# Patient Record
Sex: Male | Born: 1945
Health system: Southern US, Community
[De-identification: ages and names within clinical notes are randomized; demographics above are authoritative.]

## PROBLEM LIST (undated history)

## (undated) DIAGNOSIS — F1020 Alcohol dependence, uncomplicated: Secondary | ICD-10-CM

## (undated) DIAGNOSIS — D649 Anemia, unspecified: Secondary | ICD-10-CM

## (undated) DIAGNOSIS — M1990 Unspecified osteoarthritis, unspecified site: Secondary | ICD-10-CM

## (undated) DIAGNOSIS — K709 Alcoholic liver disease, unspecified: Principal | ICD-10-CM

## (undated) DIAGNOSIS — I2510 Atherosclerotic heart disease of native coronary artery without angina pectoris: Secondary | ICD-10-CM

## (undated) DIAGNOSIS — K219 Gastro-esophageal reflux disease without esophagitis: Secondary | ICD-10-CM

## (undated) DIAGNOSIS — R001 Bradycardia, unspecified: Secondary | ICD-10-CM

## (undated) DIAGNOSIS — N529 Male erectile dysfunction, unspecified: Secondary | ICD-10-CM

## (undated) DIAGNOSIS — E785 Hyperlipidemia, unspecified: Secondary | ICD-10-CM

## (undated) DIAGNOSIS — I509 Heart failure, unspecified: Secondary | ICD-10-CM

## (undated) DIAGNOSIS — I219 Acute myocardial infarction, unspecified: Secondary | ICD-10-CM

## (undated) DIAGNOSIS — I739 Peripheral vascular disease, unspecified: Secondary | ICD-10-CM

## (undated) DIAGNOSIS — C4490 Unspecified malignant neoplasm of skin, unspecified: Secondary | ICD-10-CM

## (undated) DIAGNOSIS — R0781 Pleurodynia: Principal | ICD-10-CM

## (undated) DIAGNOSIS — I10 Essential (primary) hypertension: Secondary | ICD-10-CM

## (undated) DIAGNOSIS — R011 Cardiac murmur, unspecified: Secondary | ICD-10-CM

## (undated) HISTORY — PX: HERNIA REPAIR: SHX51

## (undated) HISTORY — DX: Hyperlipidemia, unspecified: E78.5

## (undated) HISTORY — DX: Unspecified malignant neoplasm of skin, unspecified: C44.90

## (undated) HISTORY — DX: Other disorders of iron metabolism: E83.19

## (undated) HISTORY — DX: Alcohol dependence, uncomplicated: F10.20

## (undated) HISTORY — PX: SUPRAVALVULAR AORTIC STENOSIS REPAIR: SHX2475

## (undated) HISTORY — PX: EYE SURGERY: SHX253

## (undated) HISTORY — DX: Heart failure, unspecified: I50.9

## (undated) HISTORY — DX: Atherosclerotic heart disease of native coronary artery without angina pectoris: I25.10

## (undated) HISTORY — DX: Alcoholic liver disease, unspecified: K70.9

## (undated) HISTORY — PX: MOHS SURGERY: SHX181

## (undated) HISTORY — DX: Male erectile dysfunction, unspecified: N52.9

## (undated) HISTORY — DX: Bradycardia, unspecified: R00.1

## (undated) HISTORY — DX: Cardiac murmur, unspecified: R01.1

## (undated) HISTORY — DX: Essential (primary) hypertension: I10

## (undated) HISTORY — DX: Pleurodynia: R07.81

## (undated) HISTORY — PX: FRACTURE SURGERY: SHX138

## (undated) HISTORY — DX: Peripheral vascular disease, unspecified: I73.9

## (undated) HISTORY — PX: NASAL SEPTUM SURGERY: SHX37

## (undated) HISTORY — DX: Acute myocardial infarction, unspecified: I21.9

## (undated) HISTORY — DX: Anemia, unspecified: D64.9

## (undated) HISTORY — PX: KNEE ARTHROSCOPY: SUR90

---

## 2000-08-26 ENCOUNTER — Ambulatory Visit (HOSPITAL_COMMUNITY): Admission: RE | Admit: 2000-08-26 | Discharge: 2000-08-26 | Payer: Self-pay | Admitting: Gastroenterology

## 2008-01-26 ENCOUNTER — Inpatient Hospital Stay (HOSPITAL_COMMUNITY): Admission: EM | Admit: 2008-01-26 | Discharge: 2008-02-01 | Payer: Self-pay | Admitting: Emergency Medicine

## 2008-01-27 HISTORY — PX: CORONARY ANGIOPLASTY WITH STENT PLACEMENT: SHX49

## 2008-01-31 HISTORY — PX: CORONARY ANGIOPLASTY WITH STENT PLACEMENT: SHX49

## 2008-03-01 ENCOUNTER — Encounter (HOSPITAL_COMMUNITY): Admission: RE | Admit: 2008-03-01 | Discharge: 2008-03-31 | Payer: Self-pay | Admitting: Cardiology

## 2008-04-24 ENCOUNTER — Encounter: Admission: RE | Admit: 2008-04-24 | Discharge: 2008-04-24 | Payer: Self-pay | Admitting: Family Medicine

## 2009-03-05 ENCOUNTER — Ambulatory Visit (HOSPITAL_COMMUNITY): Admission: RE | Admit: 2009-03-05 | Discharge: 2009-03-05 | Payer: Self-pay | Admitting: Urology

## 2009-03-05 ENCOUNTER — Encounter (INDEPENDENT_AMBULATORY_CARE_PROVIDER_SITE_OTHER): Payer: Self-pay | Admitting: Urology

## 2010-11-07 ENCOUNTER — Encounter: Payer: Self-pay | Admitting: Cardiovascular Disease

## 2010-12-07 LAB — CBC
HCT: 34.7 % — ABNORMAL LOW (ref 39.0–52.0)
Hemoglobin: 11.9 g/dL — ABNORMAL LOW (ref 13.0–17.0)
MCHC: 34.2 g/dL (ref 30.0–36.0)
MCV: 99.9 fL (ref 78.0–100.0)
RBC: 3.47 MIL/uL — ABNORMAL LOW (ref 4.22–5.81)
WBC: 4.3 10*3/uL (ref 4.0–10.5)

## 2010-12-07 LAB — BASIC METABOLIC PANEL
CO2: 25 mEq/L (ref 19–32)
Chloride: 93 mEq/L — ABNORMAL LOW (ref 96–112)
GFR calc Af Amer: >60 mL/min (ref >60)
Potassium: 4.5 mEq/L (ref 3.5–5.1)
Sodium: 128 mEq/L — ABNORMAL LOW (ref 135–145)

## 2011-01-13 NOTE — Cardiovascular Report (Signed)
NAME:  FLEETWOOD, PIERRON NO.:  [id number]   MEDICAL RECORD NO.:  [id number]          PATIENT TYPE:  INP   LOCATION:  6533                         FACILITY:  [facility]   PHYSICIAN:  Nicki Guadalajara, M.D.     DATE OF BIRTH:  [birth year]   DATE OF PROCEDURE:  01/31/2008  DATE OF DISCHARGE:                            CARDIAC CATHETERIZATION   INDICATIONS:  [name] is a 65 year old gentleman who was  admitted to [facility] on Jan 26, 2008, with chest pain  syndrome worrisome for unstable angina.  Cardiac catheterization was  performed on February 29, 2009.  This revealed a 95%-99% stenosis in the  LAD between the first and second diagonal vessel in addition to 80%-90%  ostial stenosis in the circumflex marginal vessel and 50% RCA lesion.  At the time, the patient underwent successful stenting of his LAD with  ultimate insertion of a 3.0 x 13-mm drug-eluting Cypher stent.  Subsequently, he has done well.  He is now brought back to the  laboratory for staged intervention to the ostium of his circumflex  marginal vessel.   PROCEDURE:  After premedication with Valium 5 mg intravenously, the  patient was prepped and draped in usual fashion.  His right femoral  artery was punctured anteriorly and a 6-French sheath was inserted.  The  patient has been on Plavix since his initial intervention and had  received his dose of Plavix this morning.  Anticoagulation was done  utilizing Angiomax.  ACT was documented to be therapeutic.  A 6-French  of [place] 3.5 guide was used for the interventional procedure.  Due to the  ostial nature and angle of the circumflex marginal arising from the AV  groove circumflex, the decision was made to use cutting balloon  arthrotomy.  Initially, a 2.25 x 6-mm cutting balloon was inserted and  sequential dilatations were made up to 5 atmospheres.  The cutting  balloon was then upgraded to a 2.5 x 10-mm cutting balloon.  Sequential  dilatations  were made up to 7 atmospheres.  Scout angiography confirmed  an excellent angiographic result.  Consequently, with the angle takeoff  of the marginal combined with the excellent result, the decision was  made to not stent due to due to the ostial nature and the potential for  the stent needing to be partially in the AV groove circumflex.  The  scout angiography confirmed an excellent angiographic result.  The  patient tolerated the procedure well.  During the procedure, he received  numerous doses of intracoronary nitroglycerin.   HEMODYNAMIC DATA:  Central aortic pressure was 127/67.   ANGIOGRAPHIC DATA:  A re-look of the LAD system revealed a widely patent  stent in the LAD position between the first and second diagonal vessel.   The left circumflex vessel gave rise to a small first marginal vessel  and then had moderate-sized second marginal vessel.  The second marginal  vessel had 90% ostial narrowing followed by 80%-90% proximal stenosis.  Following successful cutting balloon arthrotomy with a 2.25 and ultimate  2.5 x 10-mm cutting balloon, the stenosis was reduced  to 0%.  There was  brisk TIMI III flow.  There was no evidence for dissection.  The patient  tolerated the procedure well and left the laboratory with stable  hemodynamics.   IMPRESSION:  1. Successful percutaneous coronary intervention with cutting balloon      arthrotomy of the ostium of the circumflex      obtuse marginal 2 vessel with 80%-90% stenosis being reduced to 0%      done with bivalirudin in this patient on oral Plavix in addition to      intracoronary nitroglycerin.  2. Widely patent stent in the left anterior descending artery at the      site of prior intervention from Jan 27, 2008.           ______________________________  Nicki Guadalajara, M.D.     TK/MEDQ  D:  01/31/2008  T:  01/31/2008  Job:  [id number]   cc:   Vikki Ports, M.D.

## 2011-01-13 NOTE — Op Note (Signed)
NAME:  Corey Garcia, Corey Garcia               ACCOUNT NO.:  [id number]   MEDICAL RECORD NO.:  [id number]          PATIENT TYPE:  AMB   LOCATION:  DAY                          FACILITY:  [facility]   PHYSICIAN:  Maretta Bees. Vonita Moss, M.D.DATE OF BIRTH:  [birth year]   DATE OF PROCEDURE:  03/05/2009  DATE OF DISCHARGE:                               OPERATIVE REPORT   PREOPERATIVE DIAGNOSIS:  Microhematuria and positive FSH test.   POSTOPERATIVE DIAGNOSIS:  Microhematuria and positive FSH test.   PROCEDURE:  Cystoscopy, bilateral retrograde pyelograms with  interpretation, and cold cup bladder biopsy.   SURGEON:  Dr. Larey Dresser.   ANESTHESIA:  General.   INDICATIONS:  This 65 year old gentleman was worked up for  microhematuria.  A renal ultrasound was unremarkable.  Office cystoscopy  was negative.  He did have a positive FSH test and he needed cysto and  retrograde pyelograms, but that needed to be deferred because of Plavix  therapy for a coronary stent and now comes in for workup.   PROCEDURE:  The patient was brought to the operating room and placed in  lithotomy position.  External genitalia were prepped and draped in the  usual fashion.  He was cystoscoped.  The anterior urethra was normal and  the prostatic urethra was unremarkable.  The bladder had no stones,  tumors or inflammatory lesions.   Using the cystoscope and a cone-tipped ureteral catheter, bilateral  retrograde pyelograms were obtained.  Between live fluoroscopic views  and plain film images, there was no evidence of obstruction or filling  defects in the upper urinary tract.   The bladder was then biopsied in the base and in random fashion using  the cold cup bladder biopsy forceps.  Three biopsies were taken and the  biopsy sites were fulgurated with the Bugbee electrode.  He was then  taken to the recovery room in good condition.  Blood loss was 0.  He  tolerated the procedure well.      Maretta Bees. Vonita Moss, M.D.  Electronically Signed     LJP/MEDQ  D:  03/05/2009  T:  03/05/2009  Job:  [id number]   cc:   Madaline Savage, M.D.  Fax: [phone]   Llana Aliment. Deterding, M.D.  Fax: [phone]   Vikki Ports, M.D.  Fax: [phone]

## 2011-01-13 NOTE — Cardiovascular Report (Signed)
NAME:  Corey Garcia NO.:  [id number]   MEDICAL RECORD NO.:  [id number]           PATIENT TYPE:   LOCATION:                                 FACILITY:   PHYSICIAN:  Nicki Guadalajara, M.D.     DATE OF BIRTH:  1945/10/06   DATE OF PROCEDURE:  DATE OF DISCHARGE:                            CARDIAC CATHETERIZATION   INDICATIONS:  [name] is a 65 year old gentleman who has a  history of hypertension, hyperlipidemia, family history of coronary  disease who developed new onset chest pain, two nights ago approximately  at 3:00 a.m.  His pain ultimately subsided.  Yesterday, he developed  recurrent episodes of chest pain.  Consequently, he presented to [facility].  Symptom complex was suggestive of unstable angina.  He  is now referred for diagnostic cardiac catheterization and possible  intervention.   PROCEDURE:  After premedication with Valium 5 mg intravenously, the  patient was prepped and draped in the usual fashion.  His right femoral  artery was punctured anteriorly and a 5-French sheath was inserted.  Diagnostic catheterization was done utilizing 5-French Judkins for left  and right coronary catheters.  A 5-French pigtail catheter was used for  biplane cine left ventriculography as well as distal aortography.   With a demonstration of high-grade 95-99% stenosis in the LAD as well as  concomitant disease with 80-90% stenosis in the circumflex marginal, 50%  stenosis in the right coronary artery, decision was made to attempt  intervention to at least his LAD today.  The patient was having chest  pain prior to the intervention.  His 5-French sheath was upgraded to a 6-  [place] system.  Double-bolus Integrilin and 600 mg oral Plavix were  administered.  He received weight adjusted heparin therapy.  ACT was  documented to be therapeutic.  He was started on intravenous  nitroglycerin and his nitroglycerin paste was removed.  Several doses of  intracoronary nitroglycerin also were administered throughout the  procedure.  Ultimately, his arterial sheath was upgraded to 6-French  system.  Ultimately, 6-French XB left 3.5 guide was used for the  procedure.  A Prowater wire was advanced down the LAD.  Predilatation  was done with a 2.5 x 12 mm Voyager balloon with positioning of the  catheter between the first and second diagonal vessel.  A 3.0 x 13-mm  Cypher stent then successfully inserted and deployed x2 upto 12-13  atmospheres.  Careful attention was made not to gel either diagonal  branch.  Poststent dilatation was done utilizing a 3.0 x 12-mm Quantum  balloon.  During the procedure, the patient did experience chest pain.  He did receive morphine as well as increasing doses of intracoronary  nitroglycerin.  His chest pain subsided.  However, he still noticed a  vague dull ache.  At the end of the procedure, he was uncomfortable  resting on the table, and it was felt that at this point a staged  approach should be made to open up the circumflex marginal vessel due to  his contrast load of already approximately 315 mL  of contrast.  He  tolerated the procedure well.  He returned to room in satisfactory  condition.   Hemodynamic Data:  Central aortic pressure was 137/69 and left  ventricular pressure 137/10.  During the procedure, when he was having  chest pain, his blood pressure did increase to approximately 160/100.   Angiographic Data:  Left main coronary artery was angiographically  normal and bifurcated into LAD and left circumflex system.   The LAD gave rise to a proximal diagonal vessel.  There was 95-99%  stenosis in the LAD between the first and second diagonal vessel.  There  is mild 20% narrowing after the second diagonal takeoff.  The remainder  of the LAD was free of significant disease.   The circumflex vessel was moderate size vessel.  The first marginal  vessel was a small caliber.  The second marginal vessel  was moderate  size and extended to the apex.  There was ostial narrowing of 80-90% in  the larger circumflex marginal vessel.   The right coronary artery was moderate sized vessel that had 50% diffuse  narrowing in the proximal to mid segment after an initial marginal  branch.   Biplane cine left ventriculography revealed normal LV contractility  without focal segmental wall motion abnormalities.   Distal aortography revealed normal renal arteries.  There was no  significant aortoiliac disease.   Following intervention to the LAD system with PTCA and ultimate  stenting, the 95-99% LAD stenosis was reduced to 0% with ultimate  insertion of a 3.0 x 13-mm drug-eluting Cypher stent with careful  positioning of the stent in between both the first and second diagonal  vessel without vessel encroachment and with poststent dilatation up to  approximately 3.1 mm.   IMPRESSION:  1. Normal left ventricular function.  2. Three-vessel coronary artery disease with 95-99% diffuse stenosis      in the left anterior descending between the first and second      diagonal vessel; 80-90% ostial stenosis in the circumflex marginal      two vessel and 50% diffuse narrowing in the proximal-to-mid-right      coronary artery.  3. Normal aortoiliac system.  4. Successful percutaneous transluminal coronary angioplasty/stenting      of the left anterior descending with ultimate insertion of a 3.0 x      13-mm Cypher drug-eluting stent postdilated 3.1 mm, double-bolus      Integralin/600 mg oral Plavix/weight-adjusted heparinization as      well as intravenous and intracoronary nitroglycerin.           ______________________________  Nicki Guadalajara, M.D.     TK/MEDQ  D:  01/27/2008  T:  01/28/2008  Job:  [id number]   cc:   Vikki Ports, M.D.

## 2011-01-13 NOTE — Discharge Summary (Signed)
Corey Garcia, Corey Garcia               ACCOUNT NO.:  [id number]   MEDICAL RECORD NO.:  [id number]          PATIENT TYPE:  INP   LOCATION:  6533                         FACILITY:  [facility]   PHYSICIAN:  Dr. Tresa Endo              DATE OF BIRTH:  02-Aug-1946   DATE OF ADMISSION:  01/26/2008  DATE OF DISCHARGE:  02/01/2008                               DISCHARGE SUMMARY   DISCHARGE DIAGNOSES:  1. ST-segment elevation myocardial infarction this admission, treated      with left anterior descending Cypher stenting, Jan 27, 2008, with      staged intervention and cutting balloon to the first obtuse      marginal artery on January 30, 2008.  2. Good left ventricular function.  3. Treated hypertension.  4. Dyslipidemia, Zocor started this admission.  5. Past history of LFTs, LFTs pending at dictation.   HOSPITAL COURSE:  The patient is a 65 year old male without previous  history of coronary artery disease, who has a history of hypertension  and a family history of coronary artery disease.  He presented on Jan 26, 2008, with unstable angina.  He was started on heparin and nitrates.  His enzymes came back positive with a peak CK of 188 and an MB of 22.  He was taken to the cath lab on Jan 27, 2008, and underwent  catheterization and Cypher stenting to a 95% proximal LAD lesion.  There  was a residual OM-2 narrowing of 80-90% and a 50% RCA.  Iliacs and  renals were normal.  LV function was normal.  Plan was for staged  intervention of the OM-2.  The patient did well post intervention and on  January 31, 2008, he underwent catheterization and intervention with a  cutting balloon to the OM.  We feel he can be discharged on January 31, 2008.  The patient says he has a history of elevated LFTs, his primary  care doctor apparently has worked this up in the past and the cause is  not known.  The patient does not think he has ever had a transfusion,  although he had surgery after an accident when he was 63.  As far as  I  could tell, he has not had a liver biopsy.  LFTs are pending at the time  of this dictation, an addendum will follow.   For now, we planned to discharge him on following medications:  1. Bisoprolol 5 mg daily.  2. Lisinopril 10 mg a day.  3. Coated aspirin 325 mg a day.  4. Simvastatin 40 mg a day.  5. Plavix 75 mg a day.  6. Pepcid AC p.r.n.  7. Nitroglycerin sublingual p.r.n.   LABORATORY DATA:  White count 4.3, hemoglobin 10.9, hematocrit 30.5, and  platelets 233.  Sodium 138, potassium 3.8, BUN 8, and creatinine 0.93.  CK-MBs peaked at 188, 22.9 MB.  Cholesterol was 185, LDL 105, and HDL  58.  Chest x-ray on Jan 26, 2008, shows no acute abnormalities.  INR is  1.1.  EKG showed sinus rhythm without  acute changes.   DISPOSITION:  The patient is discharged in stable condition and will  follow up with Dr. Tresa Endo as an outpatient.  He will need a close  monitoring of his LFTs, on statin.      Abelino Derrick, P.A.    ______________________________  Dr. Berline Lopes  D:  02/01/2008  T:  02/01/2008  Job:  [id number]   cc:   Vikki Ports, M.D.  Dr. Tresa Endo

## 2011-01-13 NOTE — Discharge Summary (Signed)
NAMEKOLTAN, PORTOCARRERO               ACCOUNT NO.:  [id number]   MEDICAL RECORD NO.:  [id number]          PATIENT TYPE:  INP   LOCATION:  6533                         FACILITY:  [facility]   PHYSICIAN:  Abelino Derrick, P.A.   DATE OF BIRTH:  02-26-1946   DATE OF ADMISSION:  01/26/2008  DATE OF DISCHARGE:  02/01/2008                               DISCHARGE SUMMARY   ADDENDUM   As follows, Mr. Zanetti LFTs were in the 90s.  I stopped his statin.  He  will follow up in the office with Dr. Tresa Endo.      Abelino Derrick, P.ALenard Lance  D:  02/01/2008  T:  02/02/2008  Job:  [id number]

## 2011-05-01 ENCOUNTER — Other Ambulatory Visit: Payer: Self-pay

## 2011-05-27 LAB — POCT I-STAT, CHEM 8
BUN: 14
Calcium, Ion: 1.1 — ABNORMAL LOW
Chloride: 95 — ABNORMAL LOW
Creatinine, Ser: 1.1
Glucose, Bld: 74
HCT: 39
Hemoglobin: 13.3
Potassium: 3.6
Sodium: 132 — ABNORMAL LOW
TCO2: 29

## 2011-05-27 LAB — BASIC METABOLIC PANEL
BUN: 10
BUN: 8
CO2: 28
Calcium: 8.7
Calcium: 8.8
Chloride: 100
Creatinine, Ser: 0.82
Creatinine, Ser: 0.98
Creatinine, Ser: 1.03
GFR calc Af Amer: >60
GFR calc non Af Amer: >60
Glucose, Bld: 89
Sodium: 139

## 2011-05-27 LAB — CARDIAC PANEL(CRET KIN+CKTOT+MB+TROPI)
CK, MB: 1.2
CK, MB: 10.2 — ABNORMAL HIGH
CK, MB: 15 — ABNORMAL HIGH
Relative Index: 8.7 — ABNORMAL HIGH
Relative Index: 9.4 — ABNORMAL HIGH
Total CK: 108
Total CK: 172
Troponin I: 1.2
Troponin I: 3.08
Troponin I: 3.7

## 2011-05-27 LAB — DIFFERENTIAL
Basophils Relative: 0
Eosinophils Absolute: 0.1
Eosinophils Relative: 2
Lymphs Abs: 1.2
Neutrophils Relative %: 53

## 2011-05-27 LAB — CBC
HCT: 29.9 — ABNORMAL LOW
HCT: 32.2 — ABNORMAL LOW
HCT: 36.7 — ABNORMAL LOW
Hemoglobin: 11.3 — ABNORMAL LOW
Hemoglobin: 11.3 — ABNORMAL LOW
MCHC: 35
MCHC: 35.1
MCHC: 35.7
MCV: 100.1 — ABNORMAL HIGH
MCV: 101 — ABNORMAL HIGH
MCV: 99.3
Platelets: 193
Platelets: 264
RBC: 3.14 — ABNORMAL LOW
RBC: 3.24 — ABNORMAL LOW
RDW: 12
WBC: 4
WBC: 4.2
WBC: 4.9

## 2011-05-27 LAB — LIPID PANEL
Cholesterol: 157
HDL: 53
HDL: 58
LDL Cholesterol: 105 — ABNORMAL HIGH
Total CHOL/HDL Ratio: 3
Triglycerides: 111
Triglycerides: 52
VLDL: 22

## 2011-05-27 LAB — PROTIME-INR
INR: 1.1
Prothrombin Time: 14.1
Prothrombin Time: 14.8

## 2011-05-27 LAB — POCT CARDIAC MARKERS
CKMB, poc: 1.2
Myoglobin, poc: 63.6
Operator id: 272551
Troponin i, poc: <0.05

## 2011-05-27 LAB — APTT: aPTT: 32

## 2011-05-27 LAB — PLATELET COUNT: Platelets: 203

## 2011-05-28 LAB — CBC
HCT: 30.5 — ABNORMAL LOW
HCT: 31.5 — ABNORMAL LOW
HCT: 31.6 — ABNORMAL LOW
Hemoglobin: 10.9 — ABNORMAL LOW
Hemoglobin: 11.3 — ABNORMAL LOW
Hemoglobin: 11.3 — ABNORMAL LOW
MCHC: 35.7
MCHC: 35.8
MCHC: 35.9
MCV: 100.3 — ABNORMAL HIGH
Platelets: 224
RBC: 3.13 — ABNORMAL LOW
RDW: 11.9
RDW: 12.1
RDW: 12.1

## 2011-05-28 LAB — COMPREHENSIVE METABOLIC PANEL
ALT: 96 — ABNORMAL HIGH
Albumin: 3.4 — ABNORMAL LOW
Calcium: 9
GFR calc Af Amer: >60
Glucose, Bld: 149 — ABNORMAL HIGH
Sodium: 131 — ABNORMAL LOW
Total Protein: 6.7

## 2011-05-28 LAB — CK TOTAL AND CKMB (NOT AT ARMC)
CK, MB: 1.3
CK, MB: 1.7
Total CK: 78

## 2011-05-28 LAB — BASIC METABOLIC PANEL
BUN: 8
BUN: 9
CO2: 25
CO2: 25
CO2: 27
Calcium: 8.8
Calcium: 8.9
GFR calc Af Amer: >60
Glucose, Bld: 79
Glucose, Bld: 84
Glucose, Bld: 88
Potassium: 3.5
Potassium: 3.8
Potassium: 3.9
Sodium: 138
Sodium: 138
Sodium: 139

## 2011-05-28 LAB — TROPONIN I: Troponin I: 0.35 — ABNORMAL HIGH

## 2012-04-12 DIAGNOSIS — Z125 Encounter for screening for malignant neoplasm of prostate: Secondary | ICD-10-CM | POA: Diagnosis not present

## 2012-04-12 DIAGNOSIS — I10 Essential (primary) hypertension: Secondary | ICD-10-CM | POA: Diagnosis not present

## 2012-04-12 DIAGNOSIS — I2510 Atherosclerotic heart disease of native coronary artery without angina pectoris: Secondary | ICD-10-CM | POA: Diagnosis not present

## 2012-04-12 DIAGNOSIS — E7800 Pure hypercholesterolemia, unspecified: Secondary | ICD-10-CM | POA: Diagnosis not present

## 2012-04-12 DIAGNOSIS — Z79899 Other long term (current) drug therapy: Secondary | ICD-10-CM | POA: Diagnosis not present

## 2012-04-12 DIAGNOSIS — N400 Enlarged prostate without lower urinary tract symptoms: Secondary | ICD-10-CM | POA: Diagnosis not present

## 2012-04-12 DIAGNOSIS — Z0000 Encounter for general adult medical examination without abnormal findings: Secondary | ICD-10-CM | POA: Diagnosis not present

## 2012-04-12 DIAGNOSIS — D649 Anemia, unspecified: Secondary | ICD-10-CM | POA: Diagnosis not present

## 2012-05-11 DIAGNOSIS — R3129 Other microscopic hematuria: Secondary | ICD-10-CM | POA: Diagnosis not present

## 2012-05-11 DIAGNOSIS — N401 Enlarged prostate with lower urinary tract symptoms: Secondary | ICD-10-CM | POA: Diagnosis not present

## 2012-07-05 DIAGNOSIS — L408 Other psoriasis: Secondary | ICD-10-CM | POA: Diagnosis not present

## 2012-07-11 DIAGNOSIS — Z23 Encounter for immunization: Secondary | ICD-10-CM | POA: Diagnosis not present

## 2012-10-30 ENCOUNTER — Encounter: Payer: Self-pay | Admitting: *Deleted

## 2012-11-01 DIAGNOSIS — E782 Mixed hyperlipidemia: Secondary | ICD-10-CM | POA: Diagnosis not present

## 2012-12-08 DIAGNOSIS — IMO0002 Reserved for concepts with insufficient information to code with codable children: Secondary | ICD-10-CM | POA: Diagnosis not present

## 2012-12-11 ENCOUNTER — Emergency Department (HOSPITAL_COMMUNITY)
Admission: EM | Admit: 2012-12-11 | Discharge: 2012-12-11 | Disposition: A | Discharge status: Home / Self Care | Payer: Medicare Other | Attending: Emergency Medicine | Admitting: Emergency Medicine

## 2012-12-11 ENCOUNTER — Emergency Department (HOSPITAL_COMMUNITY): Payer: Medicare Other

## 2012-12-11 ENCOUNTER — Encounter (HOSPITAL_COMMUNITY): Payer: Self-pay | Admitting: Emergency Medicine

## 2012-12-11 DIAGNOSIS — Z9861 Coronary angioplasty status: Secondary | ICD-10-CM | POA: Insufficient documentation

## 2012-12-11 DIAGNOSIS — E785 Hyperlipidemia, unspecified: Secondary | ICD-10-CM | POA: Insufficient documentation

## 2012-12-11 DIAGNOSIS — N529 Male erectile dysfunction, unspecified: Secondary | ICD-10-CM | POA: Insufficient documentation

## 2012-12-11 DIAGNOSIS — Z8679 Personal history of other diseases of the circulatory system: Secondary | ICD-10-CM | POA: Insufficient documentation

## 2012-12-11 DIAGNOSIS — Z87891 Personal history of nicotine dependence: Secondary | ICD-10-CM | POA: Insufficient documentation

## 2012-12-11 DIAGNOSIS — R079 Chest pain, unspecified: Secondary | ICD-10-CM

## 2012-12-11 DIAGNOSIS — Z7982 Long term (current) use of aspirin: Secondary | ICD-10-CM | POA: Insufficient documentation

## 2012-12-11 DIAGNOSIS — R42 Dizziness and giddiness: Secondary | ICD-10-CM | POA: Diagnosis not present

## 2012-12-11 DIAGNOSIS — I2510 Atherosclerotic heart disease of native coronary artery without angina pectoris: Secondary | ICD-10-CM | POA: Diagnosis not present

## 2012-12-11 DIAGNOSIS — Z79899 Other long term (current) drug therapy: Secondary | ICD-10-CM | POA: Diagnosis not present

## 2012-12-11 LAB — CBC
HCT: 35.7 % — ABNORMAL LOW (ref 39.0–52.0)
MCV: 95.5 fL (ref 78.0–100.0)
RDW: 12.2 % (ref 11.5–15.5)
WBC: 3.5 10*3/uL — ABNORMAL LOW (ref 4.0–10.5)

## 2012-12-11 LAB — BASIC METABOLIC PANEL
BUN: 22 mg/dL (ref 6–23)
CO2: 21 mEq/L (ref 19–32)
Chloride: 95 mEq/L — ABNORMAL LOW (ref 96–112)
Creatinine, Ser: 0.97 mg/dL (ref 0.50–1.35)
GFR calc Af Amer: >90 mL/min (ref >90)

## 2012-12-11 LAB — POCT I-STAT TROPONIN I: Troponin i, poc: 0 ng/mL (ref 0.00–0.08)

## 2012-12-11 NOTE — ED Provider Notes (Signed)
History     CSN: [id number]  Arrival date & time 12/11/12  1109   First MD Initiated Contact with Patient 12/11/12 1113      Chief Complaint  Patient presents with  . Chest Pain    (Consider location/radiation/quality/duration/timing/severity/associated sxs/prior treatment) HPI     upper chest pain  with radiation to the neck described as burning since this morning.  Status post stent placement 2009.  Complains of dizziness, but dyspnea, nausea, diaphoresis. Nothing makes symptoms better or worse. Severity is mild to moderate. Nothing at home sxs  Past Medical History  Diagnosis Date  . CAD (coronary artery disease)     last cath=01/2008, PCI; last nuc=08/09/08, normal  . ED (erectile dysfunction)     viagra helps  . Murmur, cardiac     faint early systolic grade 1/6 aortic ejection murmur  . HTN (hypertension)   . Dyslipidemia   . Bradycardia by electrocardiogram     BB decreased 11/12/11    Past Surgical History  Procedure Laterality Date  . Coronary angioplasty with stent placement  01/31/2008    PCI with cutting balloon arthrectomy of the ostium of the circ obtuse marg 2 vessel  . Coronary angioplasty with stent placement  01/27/08    nl LV function, PTCA/stenting of LAD with 3x1mm Cypher post dilated 3.52mm    Family History  Problem Relation Age of Onset  . Heart failure Mother     heart disease  . Heart failure Father     heart disease  . Heart failure Brother     "bad heart valve"    History  Substance Use Topics  . Smoking status: Former Smoker    Quit date: 10/31/1982  . Smokeless tobacco: Not on file  . Alcohol Use: Not on file      Review of Systems  All other systems reviewed and are negative.    Allergies  Thiazide-type diuretics  Home Medications   Current Outpatient Rx  Name  Route  Sig  Dispense  Refill  . aspirin 81 MG tablet   Oral   Take 81 mg by mouth daily.         Marland Kitchen atorvastatin (LIPITOR) 20 MG tablet   Oral   Take 20 mg  by mouth daily.         . bisoprolol (ZEBETA) 5 MG tablet   Oral   Take 2.5 mg by mouth daily.          . clopidogrel (PLAVIX) 75 MG tablet   Oral   Take 75 mg by mouth daily.         . furosemide (LASIX) 20 MG tablet   Oral   Take 20 mg by mouth daily.         Marland Kitchen lisinopril (PRINIVIL,ZESTRIL) 40 MG tablet   Oral   Take 40 mg by mouth daily.           BP 156/79  Pulse 77  Temp(Src) 98.6 F (37 C) (Oral)  Resp 15  SpO2 98%  Physical Exam  Nursing note and vitals reviewed. Constitutional: He is oriented to person, place, and time. He appears well-developed and well-nourished.  HENT:  Head: Normocephalic and atraumatic.  Eyes: Conjunctivae and EOM are normal. Pupils are equal, round, and reactive to light.  Neck: Normal range of motion. Neck supple.  Cardiovascular: Normal rate, regular rhythm and normal heart sounds.   Pulmonary/Chest: Effort normal and breath sounds normal.  Abdominal: Soft. Bowel sounds are normal.  Musculoskeletal: Normal range of motion.  Neurological: He is alert and oriented to person, place, and time.  Skin: Skin is warm and dry.  Psychiatric: He has a normal mood and affect.    ED Course  Procedures (including critical care time)  Labs Reviewed  CBC - Abnormal; Notable for the following:    WBC 3.5 (*)    RBC 3.74 (*)    Hemoglobin 12.7 (*)    HCT 35.7 (*)    All other components within normal limits  BASIC METABOLIC PANEL - Abnormal; Notable for the following:    Sodium 130 (*)    Chloride 95 (*)    Glucose, Bld 151 (*)    GFR calc non Af Amer 85 (*)    All other components within normal limits  POCT I-STAT TROPONIN I   No results found.   No diagnosis found.   Date: 12/11/2012  Rate: 77  Rhythm: normal sinus rhythm  QRS Axis: normal  Intervals: normal  ST/T Wave abnormalities: normal  Conduction Disutrbances: none  Narrative Interpretation: unremarkable PVC's     MDM  Patient feels much better in  emergency department. He is symptom-free. Discussed with Dr. Nicki Guadalajara [facility] cardiology. Office will call patient tomorrow for followup.        Donnetta Hutching, MD 12/12/12 1800

## 2012-12-11 NOTE — ED Notes (Signed)
Pt has returned from being out of the department; placed back on monitor, continuous pulse oximetry and blood pressure cuff

## 2012-12-11 NOTE — ED Provider Notes (Signed)
History     CSN: [id number]  Arrival date & time 12/11/12  1109   First MD Initiated Contact with Patient 12/11/12 1113      Chief Complaint  Patient presents with  . Chest Pain    (Consider location/radiation/quality/duration/timing/severity/associated sxs/prior treatment) HPI.... upper central chest pain with radiation to the neck described as a burning sensation approximately 10 AM with associated dizziness.   No dyspnea, nausea, diaphoresis. He feels much better now. Status post stent by Dr. Tresa Endo in 2009.  Negative for cigarette smoking. Negative family history. Patient takes Plavix and aspirin. Has history of hypertension and hypercholesterolemia. He says he feels much better now.  Past Medical History  Diagnosis Date  . CAD (coronary artery disease)     last cath=01/2008, PCI; last nuc=08/09/08, normal  . ED (erectile dysfunction)     viagra helps  . Murmur, cardiac     faint early systolic grade 1/6 aortic ejection murmur  . HTN (hypertension)   . Dyslipidemia   . Bradycardia by electrocardiogram     BB decreased 11/12/11    Past Surgical History  Procedure Laterality Date  . Coronary angioplasty with stent placement  01/31/2008    PCI with cutting balloon arthrectomy of the ostium of the circ obtuse marg 2 vessel  . Coronary angioplasty with stent placement  01/27/08    nl LV function, PTCA/stenting of LAD with 3x28mm Cypher post dilated 3.75mm    Family History  Problem Relation Age of Onset  . Heart failure Mother     heart disease  . Heart failure Father     heart disease  . Heart failure Brother     "bad heart valve"    History  Substance Use Topics  . Smoking status: Former Smoker    Quit date: 10/31/1982  . Smokeless tobacco: Not on file  . Alcohol Use: Not on file      Review of Systems  All other systems reviewed and are negative.    Allergies  Thiazide-type diuretics  Home Medications   Current Outpatient Rx  Name  Route  Sig  Dispense   Refill  . aspirin 81 MG tablet   Oral   Take 81 mg by mouth daily.         Marland Kitchen atorvastatin (LIPITOR) 20 MG tablet   Oral   Take 20 mg by mouth daily.         . bisoprolol (ZEBETA) 5 MG tablet   Oral   Take 2.5 mg by mouth daily.          . clopidogrel (PLAVIX) 75 MG tablet   Oral   Take 75 mg by mouth daily.         . furosemide (LASIX) 20 MG tablet   Oral   Take 20 mg by mouth daily.         Marland Kitchen lisinopril (PRINIVIL,ZESTRIL) 40 MG tablet   Oral   Take 40 mg by mouth daily.           BP 142/65  Pulse 70  Temp(Src) 98.6 F (37 C) (Oral)  Resp 13  SpO2 98%  Physical Exam  Nursing note and vitals reviewed. Constitutional: He is oriented to person, place, and time. He appears well-developed and well-nourished.  HENT:  Head: Normocephalic and atraumatic.  Eyes: Conjunctivae and EOM are normal. Pupils are equal, round, and reactive to light.  Neck: Normal range of motion. Neck supple.  Cardiovascular: Normal rate, regular rhythm and normal heart sounds.  Pulmonary/Chest: Effort normal and breath sounds normal.  Abdominal: Soft. Bowel sounds are normal.  Musculoskeletal: Normal range of motion.  Neurological: He is alert and oriented to person, place, and time.  Skin: Skin is warm and dry.  Psychiatric: He has a normal mood and affect.    ED Course  Procedures (including critical care time)  Labs Reviewed  CBC - Abnormal; Notable for the following:    WBC 3.5 (*)    RBC 3.74 (*)    Hemoglobin 12.7 (*)    HCT 35.7 (*)    All other components within normal limits  BASIC METABOLIC PANEL - Abnormal; Notable for the following:    Sodium 130 (*)    Chloride 95 (*)    Glucose, Bld 151 (*)    GFR calc non Af Amer 85 (*)    All other components within normal limits  POCT I-STAT TROPONIN I   Dg Chest 2 View  12/11/2012  *RADIOLOGY REPORT*  Clinical Data: Chest pain  CHEST - 2 VIEW  Comparison: 01/26/2008  Findings: Lungs are clear. No pleural effusion or  pneumothorax.  Cardiomediastinal silhouette is within normal limits.  Degenerative changes of the visualized thoracolumbar spine.  IMPRESSION: No evidence of acute cardiopulmonary disease.   Original Report Authenticated By: Charline Bills, M.D.      1. Chest pain     Date: 12/11/2012  Rate: 77  Rhythm: normal sinus rhythm  QRS Axis: normal  Intervals: normal  ST/T Wave abnormalities: normal  Conduction Disutrbances:none  Narrative Interpretation:   Old EKG Reviewed: changes noted PVC's   MDM  Patient is hemodynamically stable.  He feels much better. No acute changes an EKG. Troponin negative. Discussed with cardiologist. He will followup early in the week. Patient understands to return if worse        Donnetta Hutching, MD 12/11/12 1545

## 2012-12-11 NOTE — ED Notes (Signed)
Pt undressed, in gown, on monitor, continuous pulse oximetry and blood pressure cuff 

## 2012-12-11 NOTE — ED Notes (Signed)
Pt c/o central chest pain that started this morning after taking a shower that radiates to his throat. Pt describes pain as a "burning" sensation, "feels like heartburn". Pt had stents put in in 2009 so he was concerned about a heart attack. Pt denies any pain at this time but has some dizziness, lightheadness, and discomfort.

## 2012-12-11 NOTE — ED Notes (Signed)
Pt currently takes plavix and 81mg  aspirin daily. Pt describes central chest pain as "indigestion" states it radiates to throat. States his throat feels tight. Pt currently states he is not having pain but discomfort.

## 2012-12-15 ENCOUNTER — Other Ambulatory Visit (HOSPITAL_COMMUNITY): Payer: Self-pay | Admitting: Cardiology

## 2012-12-15 DIAGNOSIS — I25810 Atherosclerosis of coronary artery bypass graft(s) without angina pectoris: Secondary | ICD-10-CM

## 2012-12-15 DIAGNOSIS — R079 Chest pain, unspecified: Secondary | ICD-10-CM

## 2012-12-22 ENCOUNTER — Ambulatory Visit (HOSPITAL_COMMUNITY)
Admission: RE | Admit: 2012-12-22 | Discharge: 2012-12-22 | Disposition: A | Discharge status: Home / Self Care | Payer: Medicare Other | Source: Ambulatory Visit | Attending: Cardiology | Admitting: Cardiology

## 2012-12-22 DIAGNOSIS — I2510 Atherosclerotic heart disease of native coronary artery without angina pectoris: Secondary | ICD-10-CM | POA: Diagnosis not present

## 2012-12-22 DIAGNOSIS — I25810 Atherosclerosis of coronary artery bypass graft(s) without angina pectoris: Secondary | ICD-10-CM | POA: Insufficient documentation

## 2012-12-22 DIAGNOSIS — R079 Chest pain, unspecified: Secondary | ICD-10-CM | POA: Diagnosis not present

## 2012-12-22 MED ORDER — TECHNETIUM TC 99M SESTAMIBI GENERIC - CARDIOLITE
10.6000 | Freq: Once | INTRAVENOUS | Status: AC | PRN
  Administered 2012-12-22: 11 via INTRAVENOUS

## 2012-12-22 MED ORDER — REGADENOSON 0.4 MG/5ML IV SOLN
0.4000 mg | Freq: Once | INTRAVENOUS | Status: AC
  Administered 2012-12-22: 0.4 mg via INTRAVENOUS

## 2012-12-22 MED ORDER — TECHNETIUM TC 99M SESTAMIBI GENERIC - CARDIOLITE
30.5000 | Freq: Once | INTRAVENOUS | Status: AC | PRN
  Administered 2012-12-22: 31 via INTRAVENOUS

## 2012-12-22 NOTE — Procedures (Addendum)
[facility] [address] [place] [address] [phone]  Cardiology Nuclear Med Study  Corey Garcia is a 67 y.o. male     MRN : [id number]     DOB: 03-Jan-1946  Procedure Date: 12/22/2012  Nuclear Med Background Indication for Stress Test:  Evaluation for Ischemia and Post Hospital History:  CAD;STENT/PTCA--2009 Cardiac Risk Factors: Family History - CAD, History of Smoking, Hypertension, Lipids and Overweight  Symptoms:  Chest Pain, Dizziness and Light-Headedness   Nuclear Pre-Procedure Caffeine/Decaff Intake:  1:00am NPO After: 11 AM   IV Site: R Antecubital  IV 0.9% NS with Angio Cath:  22g  Chest Size (in):  N/A IV Started by: Emmit Pomfret, RN  Height: 5\' 4"  (1.626 m)  Cup Size: n/a  BMI:  Body mass index is 25.39 kg/(m^2). Weight:  148 lb (67.132 kg)   Tech Comments:  N/A    Nuclear Med Study 1 or 2 day study: 1 day  Stress Test Type:  Lexiscan  Order Authorizing Provider:  Thurmon Fair, MD   Resting Radionuclide: Technetium 68m Sestamibi  Resting Radionuclide Dose: 10.6 mCi   Stress Radionuclide:  Technetium 64m Sestamibi  Stress Radionuclide Dose: 30.5 mCi           Stress Protocol Rest HR: 70 Stress HR: 93  Rest BP: 114/79 Stress BP: 138/61  Exercise Time (min): n/a METS: n/a          Dose of Adenosine (mg):  n/a Dose of Lexiscan: 0.4 mg  Dose of Atropine (mg): n/a Dose of Dobutamine: n/a mcg/kg/min (at max HR)  Stress Test Technologist: Ernestene Mention, CCT Nuclear Technologist: Gonzella Lex, CNMT   Rest Procedure:  Myocardial perfusion imaging was performed at rest 45 minutes following the intravenous administration of Technetium 56m Sestamibi. Stress Procedure:  The patient received IV Lexiscan 0.4 mg over 15-seconds.  Technetium 19m Sestamibi injected at 30-seconds.  There were no significant changes with Lexiscan.  Quantitative spect images were obtained after a 45 minute  delay.  Transient Ischemic Dilatation (Normal <1.22):  1.11 Lung/Heart Ratio (Normal <0.45):  0.25 QGS EDV:  43 ml QGS ESV:  9 ml LV Ejection Fraction: 79%  Signed by    Rest ECG: NSR with PRWP  Stress ECG: No significant change from baseline ECG  QPS Raw Data Images:  Normal; no motion artifact; normal heart/lung ratio. Stress Images:  Normal homogeneous uptake in all areas of the myocardium. Rest Images:  Normal homogeneous uptake in all areas of the myocardium. Subtraction (SDS):  Normal  Impression Exercise Capacity:  Lexiscan with no exercise. BP Response:  Normal blood pressure response. Clinical Symptoms:  No symptoms. ECG Impression:  No significant ST segment change suggestive of ischemia. Comparison with Prior Nuclear Study: No significant change from previous study  Overall Impression:  Normal stress nuclear study. Low risk stress nuclear study.  LV Wall Motion:  NL LV Function, EF 79%; NL Wall Motion   Lennette Bihari, MD  12/22/2012 6:39 PM

## 2012-12-29 DIAGNOSIS — M79609 Pain in unspecified limb: Secondary | ICD-10-CM | POA: Diagnosis not present

## 2013-01-03 DIAGNOSIS — I10 Essential (primary) hypertension: Secondary | ICD-10-CM | POA: Diagnosis not present

## 2013-01-03 DIAGNOSIS — I2510 Atherosclerotic heart disease of native coronary artery without angina pectoris: Secondary | ICD-10-CM | POA: Diagnosis not present

## 2013-01-03 DIAGNOSIS — E782 Mixed hyperlipidemia: Secondary | ICD-10-CM | POA: Diagnosis not present

## 2013-01-05 DIAGNOSIS — M2020 Hallux rigidus, unspecified foot: Secondary | ICD-10-CM | POA: Diagnosis not present

## 2013-01-05 DIAGNOSIS — L03039 Cellulitis of unspecified toe: Secondary | ICD-10-CM | POA: Diagnosis not present

## 2013-01-16 DIAGNOSIS — M2020 Hallux rigidus, unspecified foot: Secondary | ICD-10-CM | POA: Diagnosis not present

## 2013-01-16 DIAGNOSIS — L03039 Cellulitis of unspecified toe: Secondary | ICD-10-CM | POA: Diagnosis not present

## 2013-02-01 DIAGNOSIS — J069 Acute upper respiratory infection, unspecified: Secondary | ICD-10-CM | POA: Diagnosis not present

## 2013-04-18 DIAGNOSIS — E7800 Pure hypercholesterolemia, unspecified: Secondary | ICD-10-CM | POA: Diagnosis not present

## 2013-04-18 DIAGNOSIS — I10 Essential (primary) hypertension: Secondary | ICD-10-CM | POA: Diagnosis not present

## 2013-04-18 DIAGNOSIS — R809 Proteinuria, unspecified: Secondary | ICD-10-CM | POA: Diagnosis not present

## 2013-04-18 DIAGNOSIS — Z23 Encounter for immunization: Secondary | ICD-10-CM | POA: Diagnosis not present

## 2013-04-18 DIAGNOSIS — D649 Anemia, unspecified: Secondary | ICD-10-CM | POA: Diagnosis not present

## 2013-04-18 DIAGNOSIS — Z79899 Other long term (current) drug therapy: Secondary | ICD-10-CM | POA: Diagnosis not present

## 2013-04-18 DIAGNOSIS — Z0000 Encounter for general adult medical examination without abnormal findings: Secondary | ICD-10-CM | POA: Diagnosis not present

## 2013-05-11 DIAGNOSIS — H11159 Pinguecula, unspecified eye: Secondary | ICD-10-CM | POA: Diagnosis not present

## 2013-05-11 DIAGNOSIS — H26499 Other secondary cataract, unspecified eye: Secondary | ICD-10-CM | POA: Diagnosis not present

## 2013-05-11 DIAGNOSIS — H43399 Other vitreous opacities, unspecified eye: Secondary | ICD-10-CM | POA: Diagnosis not present

## 2013-05-23 DIAGNOSIS — IMO0002 Reserved for concepts with insufficient information to code with codable children: Secondary | ICD-10-CM | POA: Diagnosis not present

## 2013-06-01 DIAGNOSIS — N401 Enlarged prostate with lower urinary tract symptoms: Secondary | ICD-10-CM | POA: Diagnosis not present

## 2013-06-01 DIAGNOSIS — N529 Male erectile dysfunction, unspecified: Secondary | ICD-10-CM | POA: Diagnosis not present

## 2013-06-14 DIAGNOSIS — Z8546 Personal history of malignant neoplasm of prostate: Secondary | ICD-10-CM | POA: Diagnosis not present

## 2013-07-31 DIAGNOSIS — D239 Other benign neoplasm of skin, unspecified: Secondary | ICD-10-CM | POA: Diagnosis not present

## 2013-07-31 DIAGNOSIS — L723 Sebaceous cyst: Secondary | ICD-10-CM | POA: Diagnosis not present

## 2013-07-31 DIAGNOSIS — L988 Other specified disorders of the skin and subcutaneous tissue: Secondary | ICD-10-CM | POA: Diagnosis not present

## 2013-09-11 DIAGNOSIS — M25579 Pain in unspecified ankle and joints of unspecified foot: Secondary | ICD-10-CM | POA: Diagnosis not present

## 2013-09-11 DIAGNOSIS — S93409A Sprain of unspecified ligament of unspecified ankle, initial encounter: Secondary | ICD-10-CM | POA: Diagnosis not present

## 2014-01-01 DIAGNOSIS — M79609 Pain in unspecified limb: Secondary | ICD-10-CM | POA: Diagnosis not present

## 2014-01-01 DIAGNOSIS — T148XXA Other injury of unspecified body region, initial encounter: Secondary | ICD-10-CM | POA: Diagnosis not present

## 2014-01-01 DIAGNOSIS — R21 Rash and other nonspecific skin eruption: Secondary | ICD-10-CM | POA: Diagnosis not present

## 2014-01-09 ENCOUNTER — Ambulatory Visit (INDEPENDENT_AMBULATORY_CARE_PROVIDER_SITE_OTHER): Payer: Medicare Other | Admitting: Cardiovascular Disease

## 2014-01-09 ENCOUNTER — Encounter: Payer: Self-pay | Admitting: Cardiovascular Disease

## 2014-01-09 VITALS — BP 122/68 | HR 66 | Resp 16 | Ht 64.0 in | Wt 146.8 lb

## 2014-01-09 DIAGNOSIS — I2510 Atherosclerotic heart disease of native coronary artery without angina pectoris: Secondary | ICD-10-CM

## 2014-01-09 DIAGNOSIS — D649 Anemia, unspecified: Secondary | ICD-10-CM | POA: Diagnosis not present

## 2014-01-09 DIAGNOSIS — R351 Nocturia: Secondary | ICD-10-CM

## 2014-01-09 DIAGNOSIS — I10 Essential (primary) hypertension: Secondary | ICD-10-CM

## 2014-01-09 DIAGNOSIS — E785 Hyperlipidemia, unspecified: Secondary | ICD-10-CM

## 2014-01-09 NOTE — Patient Instructions (Signed)
Dr. Croitoru recommends that you schedule a follow-up appointment in: ONE YEAR   

## 2014-01-12 ENCOUNTER — Encounter: Payer: Self-pay | Admitting: Cardiovascular Disease

## 2014-01-12 DIAGNOSIS — I2510 Atherosclerotic heart disease of native coronary artery without angina pectoris: Secondary | ICD-10-CM | POA: Insufficient documentation

## 2014-01-12 DIAGNOSIS — I10 Essential (primary) hypertension: Secondary | ICD-10-CM | POA: Insufficient documentation

## 2014-01-12 DIAGNOSIS — E785 Hyperlipidemia, unspecified: Secondary | ICD-10-CM | POA: Insufficient documentation

## 2014-01-12 DIAGNOSIS — R351 Nocturia: Secondary | ICD-10-CM | POA: Insufficient documentation

## 2014-01-12 NOTE — Progress Notes (Signed)
Patient ID: Corey Garcia, male   DOB: 06-Aug-1946, 68 y.o.   MRN: [id number]      Reason for office visit CAD  Corey Garcia is a 68 year old gentleman with a long-standing history of coronary disease. In 2009 had a high-grade stenosis of the mid LAD artery as well as an ostial stenosis of the circumflex-OM and underwent two-vessel PCI presents with cutting balloon atherectomy of the OM 2 and 3.0 x 13 mm drug-eluting Cypher stent to the LAD artery. He'll set a 50% right coronary artery stenosis a left medical therapy. He has not had any coronary symptoms since then. In April of 2014 he had a low-risk nuclear stress test without perfusion defects with normal ejection fraction. His coronary risk factors are hypertension and hyperlipidemia both well addressed with pharmacological therapy.  His only complaints today are nocturia bruising on his forearms and arthralgias especially in his ankles and left hand.   Allergies  Allergen Reactions  . Thiazide-Type Diuretics Other (See Comments)    "Lowered my blood pressure"    Current Outpatient Prescriptions  Medication Sig Dispense Refill  . aspirin 81 MG tablet Take 81 mg by mouth daily.      Marland Kitchen atorvastatin (LIPITOR) 20 MG tablet Take 20 mg by mouth daily.      . bisoprolol (ZEBETA) 5 MG tablet Take 2.5 mg by mouth daily.       . Clobetasol Prop Emollient Base (CLOBETASOL PROPIONATE E) 0.05 % emollient cream Apply 1 application topically daily as needed.      . furosemide (LASIX) 20 MG tablet Take 20 mg by mouth daily.      Marland Kitchen lisinopril (PRINIVIL,ZESTRIL) 40 MG tablet Take 40 mg by mouth daily.       No current facility-administered medications for this visit.    Past Medical History  Diagnosis Date  . CAD (coronary artery disease)     last cath=01/2008, PCI; last nuc=08/09/08, normal  . ED (erectile dysfunction)     viagra helps  . Murmur, cardiac     faint early systolic grade 1/6 aortic ejection murmur  . HTN (hypertension)   .  Dyslipidemia   . Bradycardia by electrocardiogram     BB decreased 11/12/11    Past Surgical History  Procedure Laterality Date  . Coronary angioplasty with stent placement  01/31/2008    PCI with cutting balloon arthrectomy of the ostium of the circ obtuse marg 2 vessel  . Coronary angioplasty with stent placement  01/27/08    nl LV function, PTCA/stenting of LAD with 3x37mm Cypher post dilated 3.74mm    Family History  Problem Relation Age of Onset  . Heart failure Mother     heart disease  . Heart failure Father     heart disease  . Heart failure Brother     "bad heart valve"    History   Social History  . Marital Status: Married    Spouse Name: N/A    Number of Children: N/A  . Years of Education: N/A   Occupational History  . printer maintenance tech    Social History Main Topics  . Smoking status: Former Smoker    Quit date: 10/31/1982  . Smokeless tobacco: Not on file  . Alcohol Use: Not on file  . Drug Use: Not on file  . Sexual Activity: Not on file   Other Topics Concern  . Not on file   Social History Narrative  . No narrative on file    Review  of systems: The patient specifically denies any chest pain at rest or with exertion, dyspnea at rest or with exertion, orthopnea, paroxysmal nocturnal dyspnea, syncope, palpitations, focal neurological deficits, intermittent claudication, lower extremity edema, unexplained weight gain, cough, hemoptysis or wheezing.  The patient also denies abdominal pain, nausea, vomiting, dysphagia, diarrhea, constipation, polyuria, polydipsia, dysuria, hematuria, frequency, urgency, abnormal bleeding or bruising, fever, chills, unexpected weight changes, mood swings, change in skin or hair texture, change in voice quality, auditory or visual problems, allergic reactions or rashes, new musculoskeletal complaints other than usual "aches and pains".   PHYSICAL EXAM BP 122/68  Pulse 66  Resp 16  Ht 5\' 4"  (1.626 m)  Wt 146 lb  12.8 oz (66.588 kg)  BMI 25.19 kg/m2  General: Alert, oriented x3, no distress Head: no evidence of trauma, PERRL, EOMI, no exophtalmos or lid lag, no myxedema, no xanthelasma; normal ears, nose and oropharynx Neck: normal jugular venous pulsations and no hepatojugular reflux; brisk carotid pulses without delay and no carotid bruits Chest: clear to auscultation, no signs of consolidation by percussion or palpation, normal fremitus, symmetrical and full respiratory excursions Cardiovascular: normal position and quality of the apical impulse, regular rhythm, normal first and second heart sounds, no  rubs or gallops, 1/6 early peaking systolic ejection murmur in the aortic focus Abdomen: no tenderness or distention, no masses by palpation, no abnormal pulsatility or arterial bruits, normal bowel sounds, no hepatosplenomegaly Extremities: no clubbing, cyanosis or edema; 2+ radial, ulnar and brachial pulses bilaterally; 2+ right femoral, posterior tibial and dorsalis pedis pulses; 2+ left femoral, posterior tibial and dorsalis pedis pulses; no subclavian or femoral bruits Neurological: grossly nonfocal   EKG: NSR, QS V1-V2  Lipid Panel     Component Value Date/Time   CHOL  Value: 157        ATP III CLASSIFICATION:  <200     mg/dL   Desirable  200-239  mg/dL   Borderline High  >=240    mg/dL   High 01/27/2008 0652   TRIG 52 01/27/2008 0652   HDL 53 01/27/2008 0652   CHOLHDL 3.0 01/27/2008 0652   VLDL 10 01/27/2008 0652   LDLCALC  Value: 94        Total Cholesterol/HDL:CHD Risk Coronary Heart Disease Risk Table                     Men   Women  1/2 Average Risk   3.4   3.3 01/27/2008 0652    BMET    Component Value Date/Time   NA 130* 12/11/2012 1125   K 3.9 12/11/2012 1125   CL 95* 12/11/2012 1125   CO2 21 12/11/2012 1125   GLUCOSE 151* 12/11/2012 1125   BUN 22 12/11/2012 1125   CREATININE 0.97 12/11/2012 1125   CALCIUM 8.9 12/11/2012 1125   GFRNONAA 85* 12/11/2012 1125   GFRAA >90 12/11/2012 1125      ASSESSMENT AND PLAN  Mr. Gladden does not have any symptoms of active coronary insufficiency. Will retrieve his most recent lipid profile from his primary care physician. I think his nocturia is more likely related to prostate/bladder problems, but also do not really see a reason why he needs to continue taking furosemide. We'll discontinue this medication. He is encouraged to remain physically active and report any symptoms of shortness of breath or chest discomfort.  Patient Instructions  Dr. Sallyanne Kuster recommends that you schedule a follow-up appointment in: [place].     Meds ordered this encounter  Medications  . Clobetasol Prop Emollient Base (CLOBETASOL PROPIONATE E) 0.05 % emollient cream    Sig: Apply 1 application topically daily as needed.    Marolyn Urschel  Sanda Klein, MD, [facility] [phone] office [phone] pager

## 2014-01-25 DIAGNOSIS — D649 Anemia, unspecified: Secondary | ICD-10-CM | POA: Diagnosis not present

## 2014-02-26 DIAGNOSIS — D649 Anemia, unspecified: Secondary | ICD-10-CM | POA: Diagnosis not present

## 2014-03-06 ENCOUNTER — Telehealth: Payer: Self-pay | Admitting: Hematology and Oncology

## 2014-03-06 NOTE — Telephone Encounter (Signed)
S/W PATIENT AND GAVE NP APPT FOR 07/16 @ 11 W/DR. Royal.  [place]

## 2014-03-15 ENCOUNTER — Ambulatory Visit: Payer: Medicare Other

## 2014-03-15 ENCOUNTER — Encounter: Payer: Self-pay | Admitting: Hematology and Oncology

## 2014-03-15 ENCOUNTER — Ambulatory Visit (HOSPITAL_BASED_OUTPATIENT_CLINIC_OR_DEPARTMENT_OTHER): Payer: Medicare Other | Admitting: Hematology and Oncology

## 2014-03-15 ENCOUNTER — Telehealth: Payer: Self-pay | Admitting: Hematology and Oncology

## 2014-03-15 VITALS — BP 159/87 | HR 66 | Temp 98.0°F | Resp 18 | Ht 64.0 in | Wt 146.1 lb

## 2014-03-15 DIAGNOSIS — E8779 Other fluid overload: Secondary | ICD-10-CM

## 2014-03-15 DIAGNOSIS — K709 Alcoholic liver disease, unspecified: Secondary | ICD-10-CM | POA: Insufficient documentation

## 2014-03-15 DIAGNOSIS — D72819 Decreased white blood cell count, unspecified: Secondary | ICD-10-CM | POA: Insufficient documentation

## 2014-03-15 DIAGNOSIS — D649 Anemia, unspecified: Secondary | ICD-10-CM

## 2014-03-15 DIAGNOSIS — F1010 Alcohol abuse, uncomplicated: Secondary | ICD-10-CM | POA: Insufficient documentation

## 2014-03-15 DIAGNOSIS — C4491 Basal cell carcinoma of skin, unspecified: Secondary | ICD-10-CM | POA: Diagnosis not present

## 2014-03-15 DIAGNOSIS — D638 Anemia in other chronic diseases classified elsewhere: Secondary | ICD-10-CM | POA: Insufficient documentation

## 2014-03-15 DIAGNOSIS — E871 Hypo-osmolality and hyponatremia: Secondary | ICD-10-CM | POA: Insufficient documentation

## 2014-03-15 DIAGNOSIS — F1020 Alcohol dependence, uncomplicated: Secondary | ICD-10-CM

## 2014-03-15 HISTORY — DX: Other disorders of iron metabolism: E83.19

## 2014-03-15 HISTORY — DX: Alcohol dependence, uncomplicated: F10.20

## 2014-03-15 HISTORY — DX: Alcoholic liver disease, unspecified: K70.9

## 2014-03-15 NOTE — Assessment & Plan Note (Signed)
Patient is recommended to followup closely with his dermatologist.

## 2014-03-15 NOTE — Assessment & Plan Note (Addendum)
He has chronic abnormal liver function tests and stigmata of liver disease including spider nevi and gynecomastia. I told the patient to stop drinking immediately.

## 2014-03-15 NOTE — Assessment & Plan Note (Signed)
The elevated ferritin, with normal iron studies could be related to chronic inflammation from liver disease or undiagnosed hemochromatosis. I will order additional workup prior to his next return visit.

## 2014-03-15 NOTE — Progress Notes (Signed)
Checked in new patient with no financial issues prior to seeing the dr.

## 2014-03-15 NOTE — Assessment & Plan Note (Signed)
Could be related to chronic alcohol ingestion. He is not symptomatic. Recommend observation only.

## 2014-03-15 NOTE — Assessment & Plan Note (Signed)
I felt alcoholism is a unifying diagnosis and cause of multiple problems. I told the patient to quit drinking immediately.

## 2014-03-15 NOTE — Assessment & Plan Note (Signed)
I suspect this is due to alcoholism. I recommend he stay abstinent. He is not symptomatic from leukopenia. I will observe only.

## 2014-03-15 NOTE — Assessment & Plan Note (Signed)
This is likely anemia of chronic disease. This is likely related to significant alcoholism over the past 40 years. The patient denies recent history of bleeding such as epistaxis, hematuria or hematochezia. He is asymptomatic from the anemia. We will observe for now.

## 2014-03-15 NOTE — Progress Notes (Signed)
[place] NOTE  Patient Care Team: [name], MD as PCP - General (Family Medicine)  CHIEF COMPLAINTS/PURPOSE OF CONSULTATION:  Abnormal CBC with leukopenia, and anemia, macrocytosis, abnormal liver function tests and elevated ferritin  HISTORY OF PRESENTING ILLNESS:  Corey Garcia 68 y.o. male is here because of abnormal blood count with leukopenia. The abnormal blood work was discovered during a routine visit with his primary care provider. I have the opportunity to review his blood work dated back for a while. His white blood cell count ranged from 3.1-5.6, hemoglobin from 11.3-12.6, MCV from 103-104.5, and ferritin as high as 910, along with abnormal liver function tests. He complains of fatigue, chronic headaches and chronic joint pain. The patient has history of basal cell carcinoma removed 4 times. He had been on topical steroid cream for eczema and complained of diffuse skin rash on his sun exposed area and face. He also has a significant easy bruising. The patient has been on antiplatelet agents along time for coronary artery disease. The patient denies any recent signs or symptoms of bleeding such as spontaneous epistaxis, hematuria or hematochezia.  MEDICAL HISTORY:  Past Medical History  Diagnosis Date  . CAD (coronary artery disease)     last cath=01/2008, PCI; last nuc=08/09/08, normal  . ED (erectile dysfunction)     viagra helps  . Murmur, cardiac     faint early systolic grade 1/6 aortic ejection murmur  . HTN (hypertension)   . Dyslipidemia   . Bradycardia by electrocardiogram     BB decreased 11/12/11  . Skin cancer     basal cell ca X 4  . 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 Alcoholism 03/15/2014  . Iron overload 03/15/2014    SURGICAL HISTORY: Past Surgical History  Procedure Laterality Date  . Coronary angioplasty with stent placement  01/31/2008    PCI with cutting balloon arthrectomy of the ostium of the  circ obtuse marg 2 vessel  . Coronary angioplasty with stent placement  01/27/08    nl LV function, PTCA/stenting of LAD with 3x98mm Cypher post dilated 3.22mm  . Nasal septum surgery    . Hernia repair      SOCIAL HISTORY: History   Social History  . Marital Status: Married    Spouse Name: N/A    Number of Children: N/A  . Years of Education: N/A   Occupational History  . printer maintenance tech    Social History Main Topics  . Smoking status: Former Smoker    Quit date: 10/31/1982  . Smokeless tobacco: Never Used  . Alcohol Use: 12.0 oz/week    20 Cans of beer per week  . Drug Use: No  . Sexual Activity: Not on file   Other Topics Concern  . Not on file   Social History Narrative  . No narrative on file    FAMILY HISTORY: Family History  Problem Relation Age of Onset  . Heart failure Mother     heart disease  . Heart failure Father     heart disease  . Heart failure Brother     "bad heart valve"    ALLERGIES:  is allergic to thiazide-type diuretics.  MEDICATIONS:  Current Outpatient Prescriptions  Medication Sig Dispense Refill  . aspirin 81 MG tablet Take 81 mg by mouth daily.      Marland Kitchen atorvastatin (LIPITOR) 20 MG tablet Take 20 mg by mouth daily.      . bisoprolol (ZEBETA) 5 MG  tablet Take 2.5 mg by mouth daily.       . Clobetasol Prop Emollient Base (CLOBETASOL PROPIONATE E) 0.05 % emollient cream Apply 1 application topically daily as needed.      Marland Kitchen lisinopril (PRINIVIL,ZESTRIL) 40 MG tablet Take 40 mg by mouth daily.      Marland Kitchen loratadine (CLARITIN) 10 MG tablet Take 10 mg by mouth daily.      . Misc Natural Products (GLUCOSAMINE CHOND COMPLEX/MSM) TABS Take by mouth daily.      . Multiple Vitamins-Minerals (ONE DAILY MULTIVITAMIN MEN PO) Take by mouth daily.       No current facility-administered medications for this visit.    REVIEW OF SYSTEMS:   Constitutional: Denies fevers, chills or abnormal night sweats Eyes: Denies blurriness of vision, double  vision or watery eyes Ears, nose, mouth, throat, and face: Denies mucositis or sore throat Respiratory: Denies cough, dyspnea or wheezes Cardiovascular: Denies palpitation, chest discomfort or lower extremity swelling Gastrointestinal:  Denies nausea, heartburn or change in bowel habits Skin: Denies abnormal skin rashes Lymphatics: Denies new lymphadenopathy  Neurological:Denies numbness, tingling or new weaknesses Behavioral/Psych: Mood is stable, no new changes  All other systems were reviewed with the patient and are negative.  PHYSICAL EXAMINATION: ECOG PERFORMANCE STATUS: 1 - Symptomatic but completely ambulatory  Filed Vitals:   03/15/14 1104  BP: 159/87  Pulse: 66  Temp: 98 F (36.7 C)  Resp: 18   Filed Weights   03/15/14 1104  Weight: 146 lb 1.6 oz (66.271 kg)    GENERAL:alert, no distress and comfortable. The patient is obese SKIN: He has skin rash on his face resembling eczema. He also has stigmata of liver disease with spider nevi on his chest wall and extensive bruises. EYES: normal, conjunctiva are pink and non-injected, sclera clear OROPHARYNX:no exudate, no erythema and lips, buccal mucosa, and tongue normal  NECK: supple, thyroid normal size, non-tender, without nodularity LYMPH:  no palpable lymphadenopathy in the cervical, axillary or inguinal LUNGS: clear to auscultation and percussion with normal breathing effort HEART: regular rate & rhythm and no murmurs and no lower extremity edema ABDOMEN:abdomen soft, non-tender and normal bowel sounds Musculoskeletal:no cyanosis of digits and no clubbing.  Note bilateral gynecomastia PSYCH: alert & oriented x 3 with fluent speech NEURO: no focal motor/sensory deficits  LABORATORY DATA:  I have reviewed the data as listed Lab Results  Component Value Date   WBC 3.5* 12/11/2012   HGB 12.7* 12/11/2012   HCT 35.7* 12/11/2012   MCV 95.5 12/11/2012   PLT 172 12/11/2012   No results found for this basename: NA, K, CL,  CO2, GLUCOSE, BUN, CREATININE, CALCIUM, GFRNONAA, GFRAA, PROT, ALBUMIN, AST, ALT, ALKPHOS, BILITOT, BILIDIR, IBILI,  in the last 8760 hours  ASSESSMENT & PLAN:  Anemia of chronic illness This is likely anemia of chronic disease. This is likely related to significant alcoholism over the past 40 years. The patient denies recent history of bleeding such as epistaxis, hematuria or hematochezia. He is asymptomatic from the anemia. We will observe for now.      Leukopenia I suspect this is due to alcoholism. I recommend he stay abstinent. He is not symptomatic from leukopenia. I will observe only.  Iron overload The elevated ferritin, with normal iron studies could be related to chronic inflammation from liver disease or undiagnosed hemochromatosis. I will order additional workup prior to his next return visit.  Liver disease due to alcohol He has chronic abnormal liver function tests and stigmata of liver  disease including spider nevi and gynecomastia. I told the patient to stop drinking immediately.  Hyponatremia Could be related to chronic alcohol ingestion. He is not symptomatic. Recommend observation only.  Alcoholism I felt alcoholism is a unifying diagnosis and cause of multiple problems. I told the patient to quit drinking immediately.  Basal cell carcinoma Patient is recommended to followup closely with his dermatologist.    Orders Placed This Encounter  Procedures  . CBC & Diff and Retic    Standing Status: Future     Number of Occurrences:      Standing Expiration Date: 03/15/2015  . Comprehensive metabolic panel    Standing Status: Future     Number of Occurrences:      Standing Expiration Date: 03/15/2015  . Hemochromatosis DNA, PCR    Standing Status: Future     Number of Occurrences:      Standing Expiration Date: 03/15/2015  . Ferritin    Standing Status: Future     Number of Occurrences:      Standing Expiration Date: 03/15/2015  . Iron and TIBC    Standing  Status: Future     Number of Occurrences:      Standing Expiration Date: 03/15/2015    All questions were answered. The patient knows to call the clinic with any problems, questions or concerns. I spent 40 minutes counseling the patient face to face. The total time spent in the appointment was 60 minutes and more than 50% was on counseling.     [facility], Kinnedy Mongiello, MD 03/15/2014 8:14 PM

## 2014-03-15 NOTE — Telephone Encounter (Signed)
gv adn rpinted appt sched anda vs for opt for Aug

## 2014-04-16 ENCOUNTER — Other Ambulatory Visit: Payer: Self-pay | Admitting: Hematology and Oncology

## 2014-04-16 ENCOUNTER — Telehealth: Payer: Self-pay | Admitting: *Deleted

## 2014-04-16 ENCOUNTER — Encounter: Payer: Self-pay | Admitting: Hematology and Oncology

## 2014-04-16 DIAGNOSIS — R0781 Pleurodynia: Secondary | ICD-10-CM

## 2014-04-16 HISTORY — DX: Pleurodynia: R07.81

## 2014-04-16 NOTE — Telephone Encounter (Signed)
Pt reports new pain on his Right side below his ribs for past 5 days.  States it is not severe. It is tender to touch and worse w/ coughing or deep breaths.  He denies any n/v/d or constipation.  States eating well, no decrease in appetite.  He has appt for lab this week on 8/20 and sees Dr. Alvy Bimler on 8/27.

## 2014-04-16 NOTE — Telephone Encounter (Signed)
I will order a CXR and he can have it done on 8/20

## 2014-04-17 NOTE — Telephone Encounter (Signed)
Instructed pt to go to [facility] for CXR after his lab appt on Thursday 8/20.  He verbalized understanding.

## 2014-04-19 ENCOUNTER — Other Ambulatory Visit (HOSPITAL_BASED_OUTPATIENT_CLINIC_OR_DEPARTMENT_OTHER): Payer: Medicare Other

## 2014-04-19 ENCOUNTER — Ambulatory Visit (HOSPITAL_COMMUNITY)
Admission: RE | Admit: 2014-04-19 | Discharge: 2014-04-19 | Disposition: A | Discharge status: Home / Self Care | Payer: Medicare Other | Source: Ambulatory Visit | Attending: Hematology and Oncology | Admitting: Hematology and Oncology

## 2014-04-19 DIAGNOSIS — R0781 Pleurodynia: Secondary | ICD-10-CM

## 2014-04-19 DIAGNOSIS — R079 Chest pain, unspecified: Secondary | ICD-10-CM | POA: Insufficient documentation

## 2014-04-19 DIAGNOSIS — D649 Anemia, unspecified: Secondary | ICD-10-CM

## 2014-04-19 DIAGNOSIS — C4491 Basal cell carcinoma of skin, unspecified: Secondary | ICD-10-CM | POA: Diagnosis not present

## 2014-04-19 DIAGNOSIS — F1020 Alcohol dependence, uncomplicated: Secondary | ICD-10-CM

## 2014-04-19 DIAGNOSIS — K709 Alcoholic liver disease, unspecified: Secondary | ICD-10-CM

## 2014-04-19 LAB — CBC & DIFF AND RETIC
BASO%: 0 % (ref 0.0–2.0)
Basophils Absolute: 0 10*3/uL (ref 0.0–0.1)
EOS ABS: 0.1 10*3/uL (ref 0.0–0.5)
EOS%: 3.5 % (ref 0.0–7.0)
HEMATOCRIT: 37.6 % — AB (ref 38.4–49.9)
HGB: 12.3 g/dL — ABNORMAL LOW (ref 13.0–17.1)
IMMATURE RETIC FRACT: 5.4 % (ref 3.00–10.60)
LYMPH%: 33.1 % (ref 14.0–49.0)
MCH: 32.8 pg (ref 27.2–33.4)
MCHC: 32.7 g/dL (ref 32.0–36.0)
MCV: 100.3 fL — ABNORMAL HIGH (ref 79.3–98.0)
MONO#: 0.6 10*3/uL (ref 0.1–0.9)
MONO%: 16.5 % — ABNORMAL HIGH (ref 0.0–14.0)
NEUT%: 46.9 % (ref 39.0–75.0)
NEUTROS ABS: 1.8 10*3/uL (ref 1.5–6.5)
PLATELETS: 205 10*3/uL (ref 140–400)
RBC: 3.75 10*6/uL — ABNORMAL LOW (ref 4.20–5.82)
RDW: 11.4 % (ref 11.0–14.6)
Retic %: 0.81 % (ref 0.80–1.80)
Retic Ct Abs: 30.38 10*3/uL — ABNORMAL LOW (ref 34.80–93.90)
WBC: 3.8 10*3/uL — AB (ref 4.0–10.3)
lymph#: 1.2 10*3/uL (ref 0.9–3.3)
nRBC: 0 % (ref 0–0)

## 2014-04-19 LAB — COMPREHENSIVE METABOLIC PANEL (CC13)
ALT: 29 U/L (ref 0–55)
ANION GAP: 8 meq/L (ref 3–11)
AST: 39 U/L — ABNORMAL HIGH (ref 5–34)
Albumin: 3.6 g/dL (ref 3.5–5.0)
Alkaline Phosphatase: 59 U/L (ref 40–150)
BILIRUBIN TOTAL: 0.22 mg/dL (ref 0.20–1.20)
BUN: 15.9 mg/dL (ref 7.0–26.0)
CALCIUM: 9.7 mg/dL (ref 8.4–10.4)
CHLORIDE: 100 meq/L (ref 98–109)
CO2: 28 meq/L (ref 22–29)
Creatinine: 1.1 mg/dL (ref 0.7–1.3)
Glucose: 75 mg/dl (ref 70–140)
Potassium: 4.7 mEq/L (ref 3.5–5.1)
SODIUM: 136 meq/L (ref 136–145)
TOTAL PROTEIN: 7.8 g/dL (ref 6.4–8.3)

## 2014-04-19 LAB — FERRITIN CHCC: Ferritin: 422 ng/ml — ABNORMAL HIGH (ref 22–316)

## 2014-04-19 LAB — IRON AND TIBC CHCC
%SAT: 28 % (ref 20–55)
Iron: 84 ug/dL (ref 42–163)
TIBC: 301 ug/dL (ref 202–409)
UIBC: 218 ug/dL (ref 117–376)

## 2014-04-24 DIAGNOSIS — Z125 Encounter for screening for malignant neoplasm of prostate: Secondary | ICD-10-CM | POA: Diagnosis not present

## 2014-04-24 DIAGNOSIS — L851 Acquired keratosis [keratoderma] palmaris et plantaris: Secondary | ICD-10-CM | POA: Diagnosis not present

## 2014-04-24 DIAGNOSIS — M2550 Pain in unspecified joint: Secondary | ICD-10-CM | POA: Diagnosis not present

## 2014-04-24 DIAGNOSIS — E7800 Pure hypercholesterolemia, unspecified: Secondary | ICD-10-CM | POA: Diagnosis not present

## 2014-04-24 DIAGNOSIS — I10 Essential (primary) hypertension: Secondary | ICD-10-CM | POA: Diagnosis not present

## 2014-04-24 DIAGNOSIS — Z23 Encounter for immunization: Secondary | ICD-10-CM | POA: Diagnosis not present

## 2014-04-24 DIAGNOSIS — M79609 Pain in unspecified limb: Secondary | ICD-10-CM | POA: Diagnosis not present

## 2014-04-24 DIAGNOSIS — Z0000 Encounter for general adult medical examination without abnormal findings: Secondary | ICD-10-CM | POA: Diagnosis not present

## 2014-04-24 DIAGNOSIS — Z1331 Encounter for screening for depression: Secondary | ICD-10-CM | POA: Diagnosis not present

## 2014-04-24 DIAGNOSIS — Z79899 Other long term (current) drug therapy: Secondary | ICD-10-CM | POA: Diagnosis not present

## 2014-04-24 LAB — HEMOCHROMATOSIS DNA-PCR(C282Y,H63D)

## 2014-04-26 ENCOUNTER — Ambulatory Visit (HOSPITAL_BASED_OUTPATIENT_CLINIC_OR_DEPARTMENT_OTHER): Payer: Medicare Other | Admitting: Hematology and Oncology

## 2014-04-26 ENCOUNTER — Telehealth: Payer: Self-pay | Admitting: Hematology and Oncology

## 2014-04-26 VITALS — BP 113/64 | HR 61 | Temp 97.5°F | Resp 20 | Ht 64.0 in | Wt 144.1 lb

## 2014-04-26 DIAGNOSIS — D72819 Decreased white blood cell count, unspecified: Secondary | ICD-10-CM

## 2014-04-26 DIAGNOSIS — D638 Anemia in other chronic diseases classified elsewhere: Secondary | ICD-10-CM

## 2014-04-26 NOTE — Telephone Encounter (Signed)
printed apt avs

## 2014-04-26 NOTE — Assessment & Plan Note (Signed)
He is a carrier for hemachromatosis gene. I recommend repeat ferritin in 6 months. I do not recommend phlebotomy unless ferritin is over 500. He can get his blood checked at his PCPs office.

## 2014-04-26 NOTE — Assessment & Plan Note (Signed)
This is likely anemia of chronic disease. This is likely related to significant alcoholism over the past 40 years. The patient denies recent history of bleeding such as epistaxis, hematuria or hematochezia. He is asymptomatic from the anemia. We will observe for now.

## 2014-04-26 NOTE — Progress Notes (Signed)
[place] NOTE  Corey Heck, MD SUMMARY OF HEMATOLOGIC HISTORY: The abnormal blood work was discovered during a routine visit with his primary care provider. I have the opportunity to review his blood work dated back for a while. His white blood cell count ranged from 3.1-5.6, hemoglobin from 11.3-12.6, MCV from 103-104.5, and ferritin as high as 910, along with abnormal liver function tests. He complains of fatigue, chronic headaches and chronic joint pain. The patient has history of basal cell carcinoma removed 4 times. He had been on topical steroid cream for eczema and complained of diffuse skin rash on his sun exposed area and face. He also has a significant easy bruising. The patient has been on antiplatelet agents along time for coronary artery disease. The patient denies any recent signs or symptoms of bleeding such as spontaneous epistaxis, hematuria or hematochezia. The patient was subsequently noted to be a carrier for hemachromatosis gene. INTERVAL HISTORY: Corey Garcia 68 y.o. male returns for further followup. He has quit drinking for 3 weeks. He complained of recurrence of gout. Denies recent infection. He had recent rib pain. Chest x-ray was negative.  I have reviewed the past medical history, past surgical history, social history and family history with the patient and they are unchanged from previous note.  ALLERGIES:  is allergic to thiazide-type diuretics.  MEDICATIONS:  Current Outpatient Prescriptions  Medication Sig Dispense Refill  . aspirin 81 MG tablet Take 81 mg by mouth daily.      Marland Kitchen atorvastatin (LIPITOR) 20 MG tablet Take 20 mg by mouth daily.      . bisoprolol (ZEBETA) 5 MG tablet Take 2.5 mg by mouth daily.       . Clobetasol Prop Emollient Base (CLOBETASOL PROPIONATE E) 0.05 % emollient cream Apply 1 application topically daily as needed.      Marland Kitchen lisinopril (PRINIVIL,ZESTRIL) 40 MG tablet Take 40 mg by  mouth daily.      Marland Kitchen loratadine (CLARITIN) 10 MG tablet Take 10 mg by mouth daily.      . Multiple Vitamins-Minerals (ONE DAILY MULTIVITAMIN MEN PO) Take by mouth daily.       No current facility-administered medications for this visit.     REVIEW OF SYSTEMS:   Constitutional: Denies fevers, chills or night sweats Eyes: Denies blurriness of vision Ears, nose, mouth, throat, and face: Denies mucositis or sore throat Respiratory: Denies cough, dyspnea or wheezes Cardiovascular: Denies palpitation, chest discomfort or lower extremity swelling Gastrointestinal:  Denies nausea, heartburn or change in bowel habits Skin: Denies abnormal skin rashes Lymphatics: Denies new lymphadenopathy or easy bruising Neurological:Denies numbness, tingling or new weaknesses Behavioral/Psych: Mood is stable, no new changes  All other systems were reviewed with the patient and are negative.  PHYSICAL EXAMINATION: ECOG PERFORMANCE STATUS: 0 - Asymptomatic  Filed Vitals:   04/26/14 0958  BP: 113/64  Pulse: 61  Temp: 97.5 F (36.4 C)  Resp: 20   Filed Weights   04/26/14 0958  Weight: 144 lb 1.6 oz (65.363 kg)    GENERAL:alert, no distress and comfortable SKIN: skin color, texture, turgor are normal, no rashes or significant lesions EYES: normal, Conjunctiva are pink and non-injected, sclera clear Musculoskeletal:no cyanosis of digits and no clubbing  NEURO: alert & oriented x 3 with fluent speech, no focal motor/sensory deficits  LABORATORY DATA:  I have reviewed the data as listed No results found for this or any previous visit (from the past 48 hour(s)).  Lab Results  Component Value Date   WBC 3.8* 04/19/2014   HGB 12.3* 04/19/2014   HCT 37.6* 04/19/2014   MCV 100.3* 04/19/2014   PLT 205 04/19/2014    ASSESSMENT & PLAN:  Leukopenia I suspect this is due to alcoholism. I recommend he stay abstinent. He is not symptomatic from leukopenia. I will observe only. This had improved slightly  since he discontinued alcohol intake    Anemia of chronic illness This is likely anemia of chronic disease. This is likely related to significant alcoholism over the past 40 years. The patient denies recent history of bleeding such as epistaxis, hematuria or hematochezia. He is asymptomatic from the anemia. We will observe for now.        Iron overload He is a carrier for hemachromatosis gene. I recommend repeat ferritin in 6 months. I do not recommend phlebotomy unless ferritin is over 500. He can get his blood checked at his PCPs office.    All questions were answered. The patient knows to call the clinic with any problems, questions or concerns. No barriers to learning was detected.  I spent 15 minutes counseling the patient face to face. The total time spent in the appointment was 20 minutes and more than 50% was on counseling.     [facility], [place], MD 04/26/2014 2:15 PM

## 2014-04-26 NOTE — Assessment & Plan Note (Signed)
I suspect this is due to alcoholism. I recommend he stay abstinent. He is not symptomatic from leukopenia. I will observe only. This had improved slightly since he discontinued alcohol intake

## 2014-04-27 DIAGNOSIS — R351 Nocturia: Secondary | ICD-10-CM | POA: Diagnosis not present

## 2014-04-27 DIAGNOSIS — N138 Other obstructive and reflux uropathy: Secondary | ICD-10-CM | POA: Diagnosis not present

## 2014-04-27 DIAGNOSIS — R350 Frequency of micturition: Secondary | ICD-10-CM | POA: Diagnosis not present

## 2014-04-27 DIAGNOSIS — N401 Enlarged prostate with lower urinary tract symptoms: Secondary | ICD-10-CM | POA: Diagnosis not present

## 2014-05-03 DIAGNOSIS — M19049 Primary osteoarthritis, unspecified hand: Secondary | ICD-10-CM | POA: Diagnosis not present

## 2014-05-03 DIAGNOSIS — M109 Gout, unspecified: Secondary | ICD-10-CM | POA: Insufficient documentation

## 2014-05-03 DIAGNOSIS — M159 Polyosteoarthritis, unspecified: Secondary | ICD-10-CM | POA: Diagnosis not present

## 2014-05-03 DIAGNOSIS — M130 Polyarthritis, unspecified: Secondary | ICD-10-CM | POA: Diagnosis not present

## 2014-05-03 DIAGNOSIS — R768 Other specified abnormal immunological findings in serum: Secondary | ICD-10-CM | POA: Insufficient documentation

## 2014-05-03 DIAGNOSIS — M064 Inflammatory polyarthropathy: Secondary | ICD-10-CM | POA: Insufficient documentation

## 2014-05-03 DIAGNOSIS — R894 Abnormal immunological findings in specimens from other organs, systems and tissues: Secondary | ICD-10-CM | POA: Diagnosis not present

## 2014-05-14 DIAGNOSIS — L568 Other specified acute skin changes due to ultraviolet radiation: Secondary | ICD-10-CM | POA: Diagnosis not present

## 2014-05-14 DIAGNOSIS — L570 Actinic keratosis: Secondary | ICD-10-CM | POA: Diagnosis not present

## 2014-05-17 DIAGNOSIS — L930 Discoid lupus erythematosus: Secondary | ICD-10-CM | POA: Diagnosis not present

## 2014-05-17 DIAGNOSIS — R894 Abnormal immunological findings in specimens from other organs, systems and tissues: Secondary | ICD-10-CM | POA: Diagnosis not present

## 2014-05-17 DIAGNOSIS — R21 Rash and other nonspecific skin eruption: Secondary | ICD-10-CM | POA: Diagnosis not present

## 2014-05-17 DIAGNOSIS — M159 Polyosteoarthritis, unspecified: Secondary | ICD-10-CM | POA: Diagnosis not present

## 2014-05-17 DIAGNOSIS — T50905A Adverse effect of unspecified drugs, medicaments and biological substances, initial encounter: Secondary | ICD-10-CM | POA: Insufficient documentation

## 2014-05-27 ENCOUNTER — Other Ambulatory Visit: Payer: Self-pay | Admitting: Cardiovascular Disease

## 2014-05-28 NOTE — Telephone Encounter (Signed)
Rx was sent to pharmacy electronically. 

## 2014-06-05 DIAGNOSIS — N401 Enlarged prostate with lower urinary tract symptoms: Secondary | ICD-10-CM | POA: Diagnosis not present

## 2014-06-05 DIAGNOSIS — R351 Nocturia: Secondary | ICD-10-CM | POA: Diagnosis not present

## 2014-06-20 DIAGNOSIS — R21 Rash and other nonspecific skin eruption: Secondary | ICD-10-CM | POA: Diagnosis not present

## 2014-06-20 DIAGNOSIS — M150 Primary generalized (osteo)arthritis: Secondary | ICD-10-CM | POA: Diagnosis not present

## 2014-06-20 DIAGNOSIS — R768 Other specified abnormal immunological findings in serum: Secondary | ICD-10-CM | POA: Diagnosis not present

## 2014-06-20 DIAGNOSIS — M1009 Idiopathic gout, multiple sites: Secondary | ICD-10-CM | POA: Diagnosis not present

## 2014-06-28 ENCOUNTER — Telehealth: Payer: Self-pay | Admitting: Cardiovascular Disease

## 2014-06-28 DIAGNOSIS — J342 Deviated nasal septum: Secondary | ICD-10-CM | POA: Diagnosis not present

## 2014-06-28 NOTE — Telephone Encounter (Signed)
Pt needs clearance for septolplasty Nov 11-12  With Dr. Ernesto Rutherford  ;pt. Seen in may first PA opening is 11/9

## 2014-06-28 NOTE — Telephone Encounter (Signed)
Pt to have septoplasty November 11 or 12.  Dr. Ernesto Rutherford  needs clearance.  Patient was seen in May.  First PA opening is 11-9, Dr. Loletha Grayer not until December..  Does he need appointment?

## 2014-06-28 NOTE — Telephone Encounter (Signed)
Discussed with Dr. Ernesto Rutherford. I believe his surgical risk is low, but there have been changes in his beta blocker dose and he would like him evaluated before the surgery. Please schedule with APP on a day that I'm in the office.

## 2014-06-28 NOTE — Telephone Encounter (Signed)
Per Dr. Sallyanne Kuster sched. pt. With an APP on a day that he is here to discuss clearance and his beta blocker

## 2014-06-29 ENCOUNTER — Ambulatory Visit (INDEPENDENT_AMBULATORY_CARE_PROVIDER_SITE_OTHER): Payer: Medicare Other | Admitting: Cardiovascular Disease

## 2014-06-29 ENCOUNTER — Encounter: Payer: Self-pay | Admitting: Cardiovascular Disease

## 2014-06-29 VITALS — BP 137/72 | HR 69 | Resp 16 | Ht 64.0 in | Wt 151.7 lb

## 2014-06-29 DIAGNOSIS — E785 Hyperlipidemia, unspecified: Secondary | ICD-10-CM

## 2014-06-29 DIAGNOSIS — I2510 Atherosclerotic heart disease of native coronary artery without angina pectoris: Secondary | ICD-10-CM | POA: Diagnosis not present

## 2014-06-29 DIAGNOSIS — Z01810 Encounter for preprocedural cardiovascular examination: Secondary | ICD-10-CM | POA: Diagnosis not present

## 2014-06-29 DIAGNOSIS — I10 Essential (primary) hypertension: Secondary | ICD-10-CM

## 2014-06-29 NOTE — Progress Notes (Signed)
Patient ID: Corey Garcia, male   DOB: [birth year], 68 y.o.   MRN: [id number]     Reason for office visit Preoperative cardiovascular evaluation, history of CAD  Corey Garcia is a 68 year old gentleman with a long-standing history of coronary disease. He is planning to undergo nasal surgery with Dr. Ernesto Rutherford in the next few weeks. His blood pressure medications have been adjusted a lot recently. He had to stop his lisinopril secondary to skin photosensitivity. His dose of bisoprolol has been gradually increased and he has been on the 10 mg dose for the last couple of days. He does not have any cardiovascular complaints.   In 2009 had a high-grade stenosis of the mid LAD artery as well as an ostial stenosis of the circumflex-OM and underwent two-vessel PCI presents with cutting balloon atherectomy of the OM 2 and 3.0 x 13 mm drug-eluting Cypher stent to the LAD artery. He also had a 50% right coronary artery stenosis. He has not had any coronary symptoms since then. In April of 2014 he had a low-risk nuclear stress test without perfusion defects with normal ejection fraction. His coronary risk factors are hypertension and hyperlipidemia both well addressed with pharmacological therapy.     Allergies  Allergen Reactions  . Codeine Nausea Only  . Lisinopril     PHOTO SENSITIVITY ON ARMS  . Thiazide-Type Diuretics Other (See Comments)    "Lowered my blood pressure"    Current Outpatient Prescriptions  Medication Sig Dispense Refill  . aspirin 81 MG tablet Take 81 mg by mouth daily.      Marland Kitchen atorvastatin (LIPITOR) 20 MG tablet Take 20 mg by mouth daily.      . bisoprolol (ZEBETA) 5 MG tablet Take 10 mg by mouth daily.      . Clobetasol Prop Emollient Base (CLOBETASOL PROPIONATE E) 0.05 % emollient cream Apply 1 application topically daily as needed.      . loratadine (CLARITIN) 10 MG tablet Take 10 mg by mouth daily.      . Multiple Vitamins-Minerals (ONE DAILY MULTIVITAMIN MEN PO) Take by mouth  daily.      . tacrolimus (PROTOPIC) 0.03 % ointment Apply topically as needed.      . tamsulosin (FLOMAX) 0.4 MG CAPS capsule Take 0.4 mg by mouth daily after supper.        No current facility-administered medications for this visit.    Past Medical History  Diagnosis Date  . CAD (coronary artery disease)     last cath=01/2008, PCI; last nuc=08/09/08, normal  . ED (erectile dysfunction)     viagra helps  . Murmur, cardiac     faint early systolic grade 1/6 aortic ejection murmur  . HTN (hypertension)   . Dyslipidemia   . Bradycardia by electrocardiogram     BB decreased 11/12/11  . Skin cancer     basal cell ca X 4  . 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 Alcoholism 03/15/2014  . Iron overload 03/15/2014  . Rib pain 04/16/2014    Past Surgical History  Procedure Laterality Date  . Coronary angioplasty with stent placement  01/31/2008    PCI with cutting balloon arthrectomy of the ostium of the circ obtuse marg 2 vessel  . Coronary angioplasty with stent placement  01/27/08    nl LV function, PTCA/stenting of LAD with 3x64mm Cypher post dilated 3.38mm  . Nasal septum surgery    . Hernia repair      Family History  Problem Relation Age  of Onset  . Heart failure Mother     heart disease  . Heart failure Father     heart disease  . Heart failure Brother     "bad heart valve"    History   Social History  . Marital Status: Married    Spouse Name: N/A    Number of Children: N/A  . Years of Education: N/A   Occupational History  . printer maintenance tech    Social History Main Topics  . Smoking status: Former Smoker    Quit date: 10/31/1982  . Smokeless tobacco: Never Used  . Alcohol Use: 12.0 oz/week    20 Cans of beer per week  . Drug Use: No  . Sexual Activity: Not on file   Other Topics Concern  . Not on file   Social History Narrative  . No narrative on file    Review of systems: The patient specifically denies any chest pain at rest or with  exertion, dyspnea at rest or with exertion, orthopnea, paroxysmal nocturnal dyspnea, syncope, palpitations, focal neurological deficits, intermittent claudication, lower extremity edema, unexplained weight gain, cough, hemoptysis or wheezing.  The patient also denies abdominal pain, nausea, vomiting, dysphagia, diarrhea, constipation, polyuria, polydipsia, dysuria, hematuria, frequency, urgency, abnormal bleeding or bruising, fever, chills, unexpected weight changes, mood swings, change in skin or hair texture, change in voice quality, auditory or visual problems, allergic reactions or rashes, new musculoskeletal complaints other than usual "aches and pains".   PHYSICAL EXAM BP 137/72  Pulse 69  Resp 16  Ht 5\' 4"  (1.626 m)  Wt 151 lb 11.2 oz (68.811 kg)  BMI 26.03 kg/m2 General: Alert, oriented x3, no distress  Head: no evidence of trauma, PERRL, EOMI, no exophtalmos or lid lag, no myxedema, no xanthelasma; normal ears, nose and oropharynx  Neck: normal jugular venous pulsations and no hepatojugular reflux; brisk carotid pulses without delay and no carotid bruits  Chest: clear to auscultation, no signs of consolidation by percussion or palpation, normal fremitus, symmetrical and full respiratory excursions  Cardiovascular: normal position and quality of the apical impulse, regular rhythm, normal first and second heart sounds, no rubs or gallops, 1/6 early peaking systolic ejection murmur in the aortic focus  Abdomen: no tenderness or distention, no masses by palpation, no abnormal pulsatility or arterial bruits, normal bowel sounds, no hepatosplenomegaly  Extremities: no clubbing, cyanosis or edema; 2+ radial, ulnar and brachial pulses bilaterally; 2+ right femoral, posterior tibial and dorsalis pedis pulses; 2+ left femoral, posterior tibial and dorsalis pedis pulses; no subclavian or femoral bruits  Neurological: grossly nonfocal   EKG: NSR, QS V1-V2   Lipid Panel Dr. Drema [place] August  2014 Total cholesterol 170, check a stress 49, HDL 88, LDL 72  BMET    Component Value Date/Time   NA 136 04/19/2014 0906   NA 130* 12/11/2012 1125   K 4.7 04/19/2014 0906   K 3.9 12/11/2012 1125   CL 95* 12/11/2012 1125   CO2 28 04/19/2014 0906   CO2 21 12/11/2012 1125   GLUCOSE 75 04/19/2014 0906   GLUCOSE 151* 12/11/2012 1125   BUN 15.9 04/19/2014 0906   BUN 22 12/11/2012 1125   CREATININE 1.1 04/19/2014 0906   CREATININE 0.97 12/11/2012 1125   CALCIUM 9.7 04/19/2014 0906   CALCIUM 8.9 12/11/2012 1125   GFRNONAA 85* 12/11/2012 1125   GFRAA >90 12/11/2012 1125     ASSESSMENT AND PLAN  Asymptomatic coronary artery disease with recent normal nuclear perfusion study.  Well treated  systemic hypertension on beta blocker therapy. Excellent lipid profile.  Low risk for major cardiovascular complications with planned nasal surgery. No further cardiac testing appears necessary at this time. He can stop his aspirin 5-7 days before the surgical procedure and resume it after surgery. It is important that his beta blocker be continued without interruption throughout the entire perioperative period.    Orders Placed This Encounter  Procedures  . EKG 12-Lead   Meds ordered this encounter  Medications  . tamsulosin (FLOMAX) 0.4 MG CAPS capsule    Sig: Take 0.4 mg by mouth daily after supper.   . tacrolimus (PROTOPIC) 0.03 % ointment    Sig: Apply topically as needed.  . bisoprolol (ZEBETA) 5 MG tablet    Sig: Take 10 mg by mouth daily.    Holli Humbles, MD, [place] [phone] office [phone] pager

## 2014-06-29 NOTE — Patient Instructions (Signed)
Dr. Croitoru recommends that you schedule a follow-up appointment in: One year.   

## 2014-06-29 NOTE — Telephone Encounter (Signed)
Needs appt for surgical clearance.  Scheduled for 06/29/14 9 am.

## 2014-07-03 DIAGNOSIS — H11153 Pinguecula, bilateral: Secondary | ICD-10-CM | POA: Diagnosis not present

## 2014-07-03 DIAGNOSIS — H43393 Other vitreous opacities, bilateral: Secondary | ICD-10-CM | POA: Diagnosis not present

## 2014-07-03 DIAGNOSIS — H11002 Unspecified pterygium of left eye: Secondary | ICD-10-CM | POA: Diagnosis not present

## 2014-07-10 DIAGNOSIS — J342 Deviated nasal septum: Secondary | ICD-10-CM | POA: Diagnosis not present

## 2014-07-10 DIAGNOSIS — I10 Essential (primary) hypertension: Secondary | ICD-10-CM | POA: Diagnosis not present

## 2014-07-18 ENCOUNTER — Other Ambulatory Visit: Payer: Self-pay | Admitting: Otolaryngology

## 2014-07-18 DIAGNOSIS — M8668 Other chronic osteomyelitis, other site: Secondary | ICD-10-CM | POA: Diagnosis not present

## 2014-07-18 DIAGNOSIS — J343 Hypertrophy of nasal turbinates: Secondary | ICD-10-CM | POA: Diagnosis not present

## 2014-07-18 DIAGNOSIS — H2512 Age-related nuclear cataract, left eye: Secondary | ICD-10-CM | POA: Diagnosis not present

## 2014-07-18 DIAGNOSIS — J342 Deviated nasal septum: Secondary | ICD-10-CM | POA: Diagnosis not present

## 2014-08-10 DIAGNOSIS — D239 Other benign neoplasm of skin, unspecified: Secondary | ICD-10-CM | POA: Diagnosis not present

## 2014-08-10 DIAGNOSIS — L570 Actinic keratosis: Secondary | ICD-10-CM | POA: Diagnosis not present

## 2014-08-10 DIAGNOSIS — L270 Generalized skin eruption due to drugs and medicaments taken internally: Secondary | ICD-10-CM | POA: Diagnosis not present

## 2014-08-20 DIAGNOSIS — K649 Unspecified hemorrhoids: Secondary | ICD-10-CM | POA: Diagnosis not present

## 2014-08-20 DIAGNOSIS — M25571 Pain in right ankle and joints of right foot: Secondary | ICD-10-CM | POA: Diagnosis not present

## 2014-10-09 ENCOUNTER — Ambulatory Visit: Payer: PPO | Admitting: Physical Therapy

## 2014-10-10 ENCOUNTER — Ambulatory Visit: Payer: PPO | Attending: Sports Medicine | Admitting: Physical Therapy

## 2014-10-10 DIAGNOSIS — M76821 Posterior tibial tendinitis, right leg: Secondary | ICD-10-CM | POA: Insufficient documentation

## 2014-10-10 DIAGNOSIS — M19071 Primary osteoarthritis, right ankle and foot: Secondary | ICD-10-CM | POA: Insufficient documentation

## 2014-10-10 DIAGNOSIS — M25571 Pain in right ankle and joints of right foot: Secondary | ICD-10-CM | POA: Insufficient documentation

## 2014-10-15 ENCOUNTER — Ambulatory Visit: Payer: PPO | Admitting: Physical Therapy

## 2014-10-16 ENCOUNTER — Encounter: Payer: Self-pay | Admitting: Physical Therapy

## 2014-10-16 ENCOUNTER — Ambulatory Visit: Payer: PPO | Admitting: Physical Therapy

## 2014-10-16 DIAGNOSIS — M25571 Pain in right ankle and joints of right foot: Secondary | ICD-10-CM | POA: Diagnosis present

## 2014-10-16 DIAGNOSIS — R262 Difficulty in walking, not elsewhere classified: Secondary | ICD-10-CM

## 2014-10-16 DIAGNOSIS — M76821 Posterior tibial tendinitis, right leg: Secondary | ICD-10-CM | POA: Diagnosis not present

## 2014-10-16 DIAGNOSIS — M19071 Primary osteoarthritis, right ankle and foot: Secondary | ICD-10-CM | POA: Diagnosis not present

## 2014-10-16 NOTE — Therapy (Addendum)
[address] [place], [place], [address] Phone: [phone]   Fax:  [phone]  Physical Therapy Treatment  Patient Details  Name: Corey Garcia MRN: [id number] Date of Birth: Jan 28, 1946 Referring Provider:  Verner Chol, MD  Encounter Date: 10/16/2014    Past Medical History  Diagnosis Date  . CAD (coronary artery disease)     last cath=01/2008, PCI; last nuc=08/09/08, normal  . ED (erectile dysfunction)     viagra helps  . Murmur, cardiac     faint early systolic grade 1/6 aortic ejection murmur  . HTN (hypertension)   . Dyslipidemia   . Bradycardia by electrocardiogram     BB decreased 11/12/11  . Skin cancer     basal cell ca X 4  . 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 Alcoholism 03/15/2014  . Iron overload 03/15/2014  . Rib pain 04/16/2014    Past Surgical History  Procedure Laterality Date  . Coronary angioplasty with stent placement  01/31/2008    PCI with cutting balloon arthrectomy of the ostium of the circ obtuse marg 2 vessel  . Coronary angioplasty with stent placement  01/27/08    nl LV function, PTCA/stenting of LAD with 3x102m Cypher post dilated 3.193m . Nasal septum surgery    . Hernia repair      There were no vitals taken for this visit.  Visit Diagnosis:  Right ankle pain  Difficulty walking               NuStep 5 minutes Level 4 Calf stretch with block 30 seconds 3x Ankle mobility exercises on dyna disc in sitting Red T-band ankle exercises all motions 2x15 reps PROM to the right ankle to discomfort                   PT Long Term Goals - 10/16/14 1003    PT LONG TERM GOAL #1   Title Independent with HEP   Time 7   Period Weeks   Status New   PT LONG TERM GOAL #2   Title independent with RICE   Time 7   Period Weeks   PT LONG TERM GOAL #3   Title report pain decreased 50%   Time 7   Period Weeks   Status New   PT LONG  TERM GOAL #4   Title increase ROM of the right ankle to 5 degrees DF   Time 7   Period Weeks   Status New   Additional Long Term Goals   Additional Long Term Goals Yes               Problem List Patient Active Problem List   Diagnosis Date Noted  . Preoperative cardiovascular examination 06/29/2014  . Rib pain 04/16/2014  . 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 Alcoholism 03/15/2014  . Iron overload 03/15/2014  . Anemia of chronic illness 03/15/2014  . Leukopenia 03/15/2014  . Hyponatremia 03/15/2014  . Basal cell carcinoma 03/15/2014  . CAD (coronary artery disease) 01/12/2014  . Hyperlipidemia 01/12/2014  . Essential hypertension 01/12/2014  . Nocturia 01/12/2014   PHYSICAL THERAPY DISCHARGE SUMMARY  Visits from Start of Care: 2  Current functional level related to goals / functional outcomes: unknown   Remaining deficits: unknown    Plan: Patient agrees to discharge.  Patient goals were not met. Patient is being discharged due to not returning since the last visit.  ?????  Sumner Boast, PT 04/10/2015, 8:07 AM  [address] [place], [place], [address] Phone: [phone]   Fax:  [phone]

## 2015-04-30 ENCOUNTER — Other Ambulatory Visit: Payer: Self-pay | Admitting: Hematology and Oncology

## 2015-05-02 ENCOUNTER — Telehealth: Payer: Self-pay | Admitting: *Deleted

## 2015-05-02 ENCOUNTER — Telehealth: Payer: Self-pay | Admitting: Hematology and Oncology

## 2015-05-02 NOTE — Telephone Encounter (Signed)
lvm for pt regarding to sept appt... °

## 2015-05-02 NOTE — Telephone Encounter (Signed)
PT. HAD A FOOT INJURY IN 08/2013. IN 10/2014 PT, BEGAN TAKING  TYLENOL 650MG  SIX TABLETS A DAY UNTIL 04/26/2015. ALSO ON 04/29/15 PT. HAD LAB WORK DONE AT HIS PRIMARY CARE PHYSICIAN, DR.BARNES. LAB WORK AND THIS NOTE WAS PLACED ON DR.GORSUCH'S DESK PER PT. REQUEST BEFORE HIS 05/10/15 APPOINTMENT.

## 2015-05-10 ENCOUNTER — Encounter: Payer: Self-pay | Admitting: Hematology and Oncology

## 2015-05-10 ENCOUNTER — Telehealth: Payer: Self-pay | Admitting: Hematology and Oncology

## 2015-05-10 ENCOUNTER — Ambulatory Visit (HOSPITAL_BASED_OUTPATIENT_CLINIC_OR_DEPARTMENT_OTHER): Payer: PPO | Admitting: Hematology and Oncology

## 2015-05-10 DIAGNOSIS — D638 Anemia in other chronic diseases classified elsewhere: Secondary | ICD-10-CM

## 2015-05-10 DIAGNOSIS — R748 Abnormal levels of other serum enzymes: Secondary | ICD-10-CM | POA: Diagnosis not present

## 2015-05-10 NOTE — Assessment & Plan Note (Signed)
He had acute elevated liver enzymes recently likely related to excessive Tylenol ingestion along with significant alcoholic drinks. I recommend the patient to cut back on alcohol drink. His liver enzymes has improved. I told him that could certainly cause acute elevated ferritin. Iron overload, on the other hand, could also cause elevated liver enzymes. We will monitor his blood work next month.

## 2015-05-10 NOTE — Progress Notes (Signed)
[facility] NOTE  Corey Heck, MD SUMMARY OF HEMATOLOGIC HISTORY:  The abnormal blood work was discovered during a routine visit with his primary care provider. I have the opportunity to review his blood work dated back for a while. His white blood cell count ranged from 3.1-5.6, hemoglobin from 11.3-12.6, MCV from 103-104.5, and ferritin as high as 910, along with abnormal liver function tests. He complains of fatigue, chronic headaches and chronic joint pain. The patient has history of basal cell carcinoma removed 4 times. He had been on topical steroid cream for eczema and complained of diffuse skin rash on his sun exposed area and face. He also has a significant easy bruising. The patient has been on antiplatelet agents along time for coronary artery disease. The patient denies any recent signs or symptoms of bleeding such as spontaneous epistaxis, hematuria or hematochezia. 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 male returns for  Further follow-up. The patient had recent significant arthritis pain and took some extra Tylenol. He was found to have acute elevated liver enzymes and significant elevated ferritin. Subsequently, his liver enzymes and ferritin level has improved. He was seen by orthopedic surgeon and had some injection for plantar fascitis and hip pain and bursitis. He feels better.  He is ambivalent about the plan for phlebotomy. I have reviewed the past medical history, past surgical history, social history and family history with the patient and they are unchanged from previous note.  ALLERGIES:  is allergic to codeine; lisinopril; and thiazide-type diuretics.  MEDICATIONS:  Current Outpatient Prescriptions  Medication Sig Dispense Refill  . aspirin 81 MG tablet Take 81 mg by mouth daily.    Marland Kitchen atorvastatin (LIPITOR) 20 MG  tablet Take 20 mg by mouth daily.    . bisoprolol (ZEBETA) 5 MG tablet Take 15 mg by mouth daily.     . Clobetasol Prop Emollient Base (CLOBETASOL PROPIONATE E) 0.05 % emollient cream Apply 1 application topically daily as needed.    . Multiple Vitamins-Minerals (ONE DAILY MULTIVITAMIN MEN PO) Take by mouth daily.    . tacrolimus (PROTOPIC) 0.03 % ointment Apply topically as needed.     No current facility-administered medications for this visit.     REVIEW OF SYSTEMS:   Constitutional: Denies fevers, chills or night sweats Eyes: Denies blurriness of vision Ears, nose, mouth, throat, and face: Denies mucositis or sore throat Respiratory: Denies cough, dyspnea or wheezes Cardiovascular: Denies palpitation, chest discomfort or lower extremity swelling Gastrointestinal:  Denies nausea, heartburn or change in bowel habits Skin: Denies abnormal skin rashes Lymphatics: Denies new lymphadenopathy or easy bruising Neurological:Denies numbness, tingling or new weaknesses Behavioral/Psych: Mood is stable, no new changes  All other systems were reviewed with the patient and are negative.  PHYSICAL EXAMINATION: ECOG PERFORMANCE STATUS: 0 - Asymptomatic  Filed Vitals:   05/10/15 1128  BP: 154/73  Pulse: 65  Temp: 98 F (36.7 C)  Resp: 18   Filed Weights   05/10/15 1128  Weight: 153 lb 9.6 oz (69.673 kg)    GENERAL:alert, no distress and comfortable SKIN: skin color, texture, turgor are normal, no rashes or significant lesions EYES: normal, Conjunctiva are pink and non-injected, sclera clear Musculoskeletal:no cyanosis of digits and no clubbing  NEURO: alert & oriented x 3 with fluent speech, no focal motor/sensory deficits  LABORATORY DATA:  I have reviewed the data as listed No results  found for this or any previous visit (from the past 48 hour(s)).  Lab Results  Component Value Date   WBC 3.8* 04/19/2014   HGB 12.3* 04/19/2014   HCT 37.6* 04/19/2014   MCV 100.3* 04/19/2014    PLT 205 04/19/2014   ASSESSMENT & PLAN:  Iron overload The patient have wide fluctuation of his ferritin level. I have historical records. Last year, on 02/26/2014, ferritin was 910. On 12/26/2014, ferritin was down to 455. On 04/29/2015, ferritin was high as 2626. Most recently on 05/07/2015, ferritin was down to 1126. Certainly, ferritin is an acute phase reactant. I suspect he had baseline iron overload exacerbated by recent acute liver injury from alcohol intake and recent Tylenol ingestion. The patient once to defer phlebotomy as his ferritin is improving. I recommend repeat blood work and iron studies in 6 weeks. If ferritin is above 500, we will proceed with phlebotomy and he agreed with the plan.  Anemia of chronic illness This is likely anemia of chronic disease. This is likely related to significant alcoholism over the past 40 years. The patient denies recent history of bleeding such as epistaxis, hematuria or hematochezia. He is asymptomatic from the anemia. We will observe for now.        Elevated liver enzymes  He had acute elevated liver enzymes recently likely related to excessive Tylenol ingestion along with significant alcoholic drinks. I recommend the patient to cut back on alcohol drink. His liver enzymes has improved. I told him that could certainly cause acute elevated ferritin. Iron overload, on the other hand, could also cause elevated liver enzymes. We will monitor his blood work next month.   All questions were answered. The patient knows to call the clinic with any problems, questions or concerns. No barriers to learning was detected.  I spent 20 minutes counseling the patient face to face. The total time spent in the appointment was 25 minutes and more than 50% was on counseling.     Alvy Bimler, Jayshon Dommer, MD 9/9/20161:05 PM

## 2015-05-10 NOTE — Assessment & Plan Note (Signed)
This is likely anemia of chronic disease. This is likely related to significant alcoholism over the past 40 years. The patient denies recent history of bleeding such as epistaxis, hematuria or hematochezia. He is asymptomatic from the anemia. We will observe for now.

## 2015-05-10 NOTE — Assessment & Plan Note (Signed)
The patient have wide fluctuation of his ferritin level. I have historical records. Last year, on 02/26/2014, ferritin was 910. On 12/26/2014, ferritin was down to 455. On 04/29/2015, ferritin was high as 2626. Most recently on 05/07/2015, ferritin was down to 1126. Certainly, ferritin is an acute phase reactant. I suspect he had baseline iron overload exacerbated by recent acute liver injury from alcohol intake and recent Tylenol ingestion. The patient once to defer phlebotomy as his ferritin is improving. I recommend repeat blood work and iron studies in 6 weeks. If ferritin is above 500, we will proceed with phlebotomy and he agreed with the plan.

## 2015-05-10 NOTE — Telephone Encounter (Signed)
per pof to sch pt appt-gave pt copy of avs °

## 2015-06-17 ENCOUNTER — Other Ambulatory Visit: Payer: Self-pay

## 2015-06-19 ENCOUNTER — Other Ambulatory Visit: Payer: Self-pay | Admitting: Hematology and Oncology

## 2015-06-20 ENCOUNTER — Other Ambulatory Visit (HOSPITAL_BASED_OUTPATIENT_CLINIC_OR_DEPARTMENT_OTHER): Payer: PPO

## 2015-06-20 LAB — FERRITIN CHCC

## 2015-06-20 LAB — COMPREHENSIVE METABOLIC PANEL (CC13)
ALT: 82 U/L — ABNORMAL HIGH (ref 0–55)
AST: 88 U/L — AB (ref 5–34)
Albumin: 3.3 g/dL — ABNORMAL LOW (ref 3.5–5.0)
Alkaline Phosphatase: 59 U/L (ref 40–150)
Anion Gap: 7 mEq/L (ref 3–11)
BUN: 8.3 mg/dL (ref 7.0–26.0)
CHLORIDE: 104 meq/L (ref 98–109)
CO2: 28 meq/L (ref 22–29)
Calcium: 9.1 mg/dL (ref 8.4–10.4)
Creatinine: 0.9 mg/dL (ref 0.7–1.3)
EGFR: 89 mL/min/{1.73_m2} — ABNORMAL LOW (ref >90)
GLUCOSE: 90 mg/dL (ref 70–140)
POTASSIUM: 4.6 meq/L (ref 3.5–5.1)
SODIUM: 138 meq/L (ref 136–145)
Total Bilirubin: 0.45 mg/dL (ref 0.20–1.20)
Total Protein: 6.9 g/dL (ref 6.4–8.3)

## 2015-06-20 LAB — CBC WITH DIFFERENTIAL/PLATELET
BASO%: 0.2 % (ref 0.0–2.0)
BASOS ABS: 0 10*3/uL (ref 0.0–0.1)
EOS%: 1.9 % (ref 0.0–7.0)
Eosinophils Absolute: 0.1 10*3/uL (ref 0.0–0.5)
HEMATOCRIT: 38.7 % (ref 38.4–49.9)
HEMOGLOBIN: 12.8 g/dL — AB (ref 13.0–17.1)
LYMPH%: 35.6 % (ref 14.0–49.0)
MCH: 34.3 pg — AB (ref 27.2–33.4)
MCHC: 33.1 g/dL (ref 32.0–36.0)
MCV: 103.8 fL — ABNORMAL HIGH (ref 79.3–98.0)
MONO#: 0.9 10*3/uL (ref 0.1–0.9)
MONO%: 19.3 % — AB (ref 0.0–14.0)
NEUT#: 2 10*3/uL (ref 1.5–6.5)
NEUT%: 43 % (ref 39.0–75.0)
Platelets: 165 10*3/uL (ref 140–400)
RBC: 3.73 10*6/uL — ABNORMAL LOW (ref 4.20–5.82)
RDW: 11.8 % (ref 11.0–14.6)
WBC: 4.7 10*3/uL (ref 4.0–10.3)
lymph#: 1.7 10*3/uL (ref 0.9–3.3)

## 2015-06-24 ENCOUNTER — Telehealth: Payer: Self-pay | Admitting: *Deleted

## 2015-06-24 NOTE — Telephone Encounter (Signed)
-----   Message from [name], MD sent at 06/24/2015  7:31 AM EDT ----- Regarding: elevated ferritin PLs let him know ferritin is very high Recommend phlebotomy monthly x 6 months I he agrees, let me know when he is ready to start and I will place orders (start next week earliest) ----- Message -----    From: [facility]    Sent: 06/20/2015   1:12 PM      To: [name], MD

## 2015-06-24 NOTE — Telephone Encounter (Signed)
Every 6 months

## 2015-06-24 NOTE — Telephone Encounter (Signed)
Pt states he is going to see if he can go to the [facility] for phlebotomies.  He will call nurse back to let us know.  If he goes to [facility] once month,  How often does he need to come here for labs?

## 2015-06-26 ENCOUNTER — Telehealth: Payer: Self-pay | Admitting: *Deleted

## 2015-06-26 ENCOUNTER — Ambulatory Visit (INDEPENDENT_AMBULATORY_CARE_PROVIDER_SITE_OTHER): Payer: PPO | Admitting: Cardiovascular Disease

## 2015-06-26 ENCOUNTER — Encounter: Payer: Self-pay | Admitting: Cardiovascular Disease

## 2015-06-26 VITALS — BP 130/78 | HR 60 | Resp 16 | Ht 64.0 in | Wt 152.0 lb

## 2015-06-26 DIAGNOSIS — I25118 Atherosclerotic heart disease of native coronary artery with other forms of angina pectoris: Secondary | ICD-10-CM | POA: Diagnosis not present

## 2015-06-26 DIAGNOSIS — I10 Essential (primary) hypertension: Secondary | ICD-10-CM | POA: Diagnosis not present

## 2015-06-26 DIAGNOSIS — E785 Hyperlipidemia, unspecified: Secondary | ICD-10-CM | POA: Diagnosis not present

## 2015-06-26 NOTE — Progress Notes (Signed)
Patient ID: Corey Garcia, male   DOB: [birth year], 69 y.o.   MRN: [id number]     Cardiology Office Note   Date:  06/26/2015   ID:  Kyrillos, Adams [birth year], MRN [id number]  PCP:  [name], MD  Cardiologist:   Sanda Klein, MD   Chief Complaint  Patient presents with  . Annual Exam    patient reports nomproblems      History of Present Illness: Corey Garcia is a 69 y.o. male who presents for CAD follow up.  He has not had any interim cardiovascular events. He denies angina or dyspnea on exertion and has not experienced syncope, palpitations, edema, intermittent claudication or focal neurological events. Since his last appointment he has been diagnosed with hemochromatosis confirmed to be a heterozygous carrier of mutation by genetic testing. At one point his ferritin was extremely high at over 2000, but this appears to be at least in part related to an acute phase reaction increase. Dr. Alvy Bimler has recommended phlebotomy , but the patient is reluctant at least in part due to the cost  In 2009 had a high-grade stenosis of the mid LAD artery as well as an ostial stenosis of the circumflex-OM and underwent two-vessel PCI presents with cutting balloon atherectomy of the OM 2 and 3.0 x 13 mm drug-eluting Cypher stent to the LAD artery. He also had a 50% right coronary artery stenosis. He has not had any coronary symptoms since then. In April of 2014 he had a low-risk nuclear stress test without perfusion defects with normal ejection fraction. His coronary risk factors are hypertension and hyperlipidemia both well addressed with pharmacological therapy.  he has a history of photosensitivity with lisinopril.  Past Medical History  Diagnosis Date  . CAD (coronary artery disease)     last cath=01/2008, PCI; last nuc=08/09/08, normal  . ED (erectile dysfunction)     viagra helps  . Murmur, cardiac     faint early systolic grade 1/6 aortic ejection murmur  . HTN  (hypertension)   . Dyslipidemia   . Bradycardia by electrocardiogram     BB decreased 11/12/11  . Skin cancer     basal cell ca X 4  . Liver disease due to alcohol ([place]) 03/15/2014  . Alcoholism ([place]) 03/15/2014  . Iron overload 03/15/2014  . Rib pain 04/16/2014    Past Surgical History  Procedure Laterality Date  . Coronary angioplasty with stent placement  01/31/2008    PCI with cutting balloon arthrectomy of the ostium of the circ obtuse marg 2 vessel  . Coronary angioplasty with stent placement  01/27/08    nl LV function, PTCA/stenting of LAD with 3x4mm Cypher post dilated 3.75mm  . Nasal septum surgery    . Hernia repair       Current Outpatient Prescriptions  Medication Sig Dispense Refill  . tamsulosin (FLOMAX) 0.4 MG CAPS capsule Take 0.4 mg by mouth daily.    Marland Kitchen aspirin 81 MG tablet Take 81 mg by mouth daily.    Marland Kitchen atorvastatin (LIPITOR) 20 MG tablet Take 20 mg by mouth daily.    . bisoprolol (ZEBETA) 5 MG tablet Take 10 mg by mouth daily.     . Clobetasol Prop Emollient Base (CLOBETASOL PROPIONATE E) 0.05 % emollient cream Apply 1 application topically daily as needed.    . Multiple Vitamins-Minerals (ONE DAILY MULTIVITAMIN MEN PO) Take by mouth daily.    . tacrolimus (PROTOPIC) 0.03 % ointment Apply topically as needed.  No current facility-administered medications for this visit.    Allergies:   Codeine; Lisinopril; and Thiazide-type diuretics    Social History:  The patient  reports that he quit smoking about 32 years ago. He has never used smokeless tobacco. He reports that he drinks about 12.0 oz of alcohol per week. He reports that he does not use illicit drugs.   Family History:  The patient's family history includes Heart failure in his brother, father, and mother.    ROS:  Please see the history of present illness.    Otherwise, review of systems positive for none.   All other systems are reviewed and negative.    PHYSICAL EXAM: VS:  BP 130/78 mmHg   Pulse 60  Resp 16  Ht 5\' 4"  (1.626 m)  Wt 152 lb (68.947 kg)  BMI 26.08 kg/m2 , BMI Body mass index is 26.08 kg/(m^2).  General: Alert, oriented x3, no distress Head: no evidence of trauma, PERRL, EOMI, no exophtalmos or lid lag, no myxedema, no xanthelasma; normal ears, nose and oropharynx Neck: normal jugular venous pulsations and no hepatojugular reflux; brisk carotid pulses without delay and no carotid bruits Chest: clear to auscultation, no signs of consolidation by percussion or palpation, normal fremitus, symmetrical and full respiratory excursions Cardiovascular: normal position and quality of the apical impulse, regular rhythm, normal first and second heart sounds, no murmurs, rubs or gallops Abdomen: no tenderness or distention, no masses by palpation, no abnormal pulsatility or arterial bruits, normal bowel sounds, no hepatosplenomegaly Extremities: no clubbing, cyanosis or edema; 2+ radial, ulnar and brachial pulses bilaterally; 2+ right femoral, posterior tibial and dorsalis pedis pulses; 2+ left femoral, posterior tibial and dorsalis pedis pulses; no subclavian or femoral bruits Neurological: grossly nonfocal Psych: euthymic mood, full affect   EKG:  EKG is ordered today. The ekg ordered today demonstrates  Normal sinus rhythm, QTC 374   Recent Labs: 06/20/2015: ALT 82*; BUN 8.3; Creatinine 0.9; HGB 12.8*; Platelets 165; Potassium 4.6; Sodium 138    Lipid Panel  August 29 total cholesterol 153, triglycerides 102, HDL 67, LDL 66   Wt Readings from Last 3 Encounters:  06/26/15 152 lb (68.947 kg)  05/10/15 153 lb 9.6 oz (69.673 kg)  06/29/14 151 lb 11.2 oz (68.811 kg)     hematology clinic notes reviewed.   ASSESSMENT AND PLAN:  1.  Coronary artery disease status post remote stent placement, asymptomatic/free of angina. The focus is on risk factor modification. His lipid profile is excellent and the current treatment. His blood pressure is in the desirable range ,  after multiple adjustments have been made in his antihypertensive regimen. He does not smoke and does not have diabetes mellitus.  2.  Hemochromatosis with evidence of iron overload but without overt evidence of and organ damage. I have encouraged him to follow Dr. Simeon Craft such as recommendations for phlebotomy , but if he cannot afford this, consider simply donating blood on a regular basis.  3.  Essential hypertension well controlled  4.  Hyperlipidemia with excellent lipid profile on statin.    Current medicines are reviewed at length with the patient today.  The patient does not have concerns regarding medicines.  The following changes have been made:  no change  Labs/ tests ordered today include:  No orders of the defined types were placed in this encounter.     Patient Instructions  Dr. Sallyanne Kuster recommends that you schedule a follow-up appointment in: [place]  Mikael Spray, MD  06/26/2015 2:51 PM    Sanda Klein, MD, [facility] [phone] office [phone] pager

## 2015-06-26 NOTE — Patient Instructions (Signed)
Dr. Croitoru recommends that you schedule a follow-up appointment in: ONE YEAR   

## 2015-06-26 NOTE — Telephone Encounter (Addendum)
[name] called reporting "I had my first ever phlebotomy today at a blood center.  She wanted phlebotomy once a month for six months but I have to wait eight weeks before I can have another phlebotomy at the blood center.  I'll have another right before the end of the year and again late February or first of March.  Then, I'd like to have labs drawn to check where I am.  It would cost $170.00 to have phlebotomy at [facility] and that's a small fortune and I can't do that.  Return number [phone]."

## 2015-06-27 ENCOUNTER — Telehealth: Payer: Self-pay | Admitting: Hematology and Oncology

## 2015-06-27 ENCOUNTER — Other Ambulatory Visit: Payer: Self-pay | Admitting: Hematology and Oncology

## 2015-06-27 ENCOUNTER — Telehealth: Payer: Self-pay | Admitting: *Deleted

## 2015-06-27 NOTE — Telephone Encounter (Signed)
I cannot do anything about the rule from blood bank regarding q 8 weeks donation I placed POF and lab order for 3/27

## 2015-06-27 NOTE — Addendum Note (Signed)
Addended by: Janett Labella A on: 06/27/2015 08:17 AM   Modules accepted: Orders

## 2015-06-27 NOTE — Telephone Encounter (Signed)
s.w. pt to sched lab...Corey Kitchenhe did not want to sched lab at this time.Corey Garcia.he will call back to sched after his third tx.

## 2015-06-27 NOTE — Telephone Encounter (Signed)
Opened note to inform pt of his lab appt to be scheduled.  See note from scheduler already s/w pt and he doesn't want to schedule lab at this time,  He will call to schedule later.

## 2015-06-27 NOTE — Telephone Encounter (Signed)
Call Documentation      Ilean China at 06/27/2015 9:36 AM     Status: Signed       Expand All Collapse All   s.w. pt to sched lab...Marland Kitchenhe did not want to sched lab at this time.Marland Kitchen.he will call back to sched after his third tx.

## 2015-09-01 DEATH — deceased

## 2015-09-26 DIAGNOSIS — C44329 Squamous cell carcinoma of skin of other parts of face: Secondary | ICD-10-CM | POA: Diagnosis not present

## 2015-10-24 DIAGNOSIS — D0439 Carcinoma in situ of skin of other parts of face: Secondary | ICD-10-CM | POA: Diagnosis not present

## 2015-11-14 DIAGNOSIS — R6889 Other general symptoms and signs: Secondary | ICD-10-CM | POA: Diagnosis not present

## 2015-11-14 DIAGNOSIS — R05 Cough: Secondary | ICD-10-CM | POA: Diagnosis not present

## 2015-11-14 DIAGNOSIS — J029 Acute pharyngitis, unspecified: Secondary | ICD-10-CM | POA: Diagnosis not present

## 2015-11-20 DIAGNOSIS — R69 Illness, unspecified: Secondary | ICD-10-CM | POA: Diagnosis not present

## 2015-11-20 DIAGNOSIS — J00 Acute nasopharyngitis [common cold]: Secondary | ICD-10-CM | POA: Diagnosis not present

## 2015-11-25 ENCOUNTER — Other Ambulatory Visit: Payer: PPO

## 2015-12-23 ENCOUNTER — Other Ambulatory Visit: Payer: Self-pay

## 2015-12-23 DIAGNOSIS — D485 Neoplasm of uncertain behavior of skin: Secondary | ICD-10-CM | POA: Diagnosis not present

## 2015-12-23 DIAGNOSIS — L570 Actinic keratosis: Secondary | ICD-10-CM | POA: Diagnosis not present

## 2015-12-27 IMAGING — CR DG CHEST 2V
2 series · 2 of 2 positions shown · non-contrast
Comparison: 12/11/2012

CLINICAL DATA: Right posterior rib pain, no known injury

EXAM:
CHEST  2 VIEW

[w chest pa]
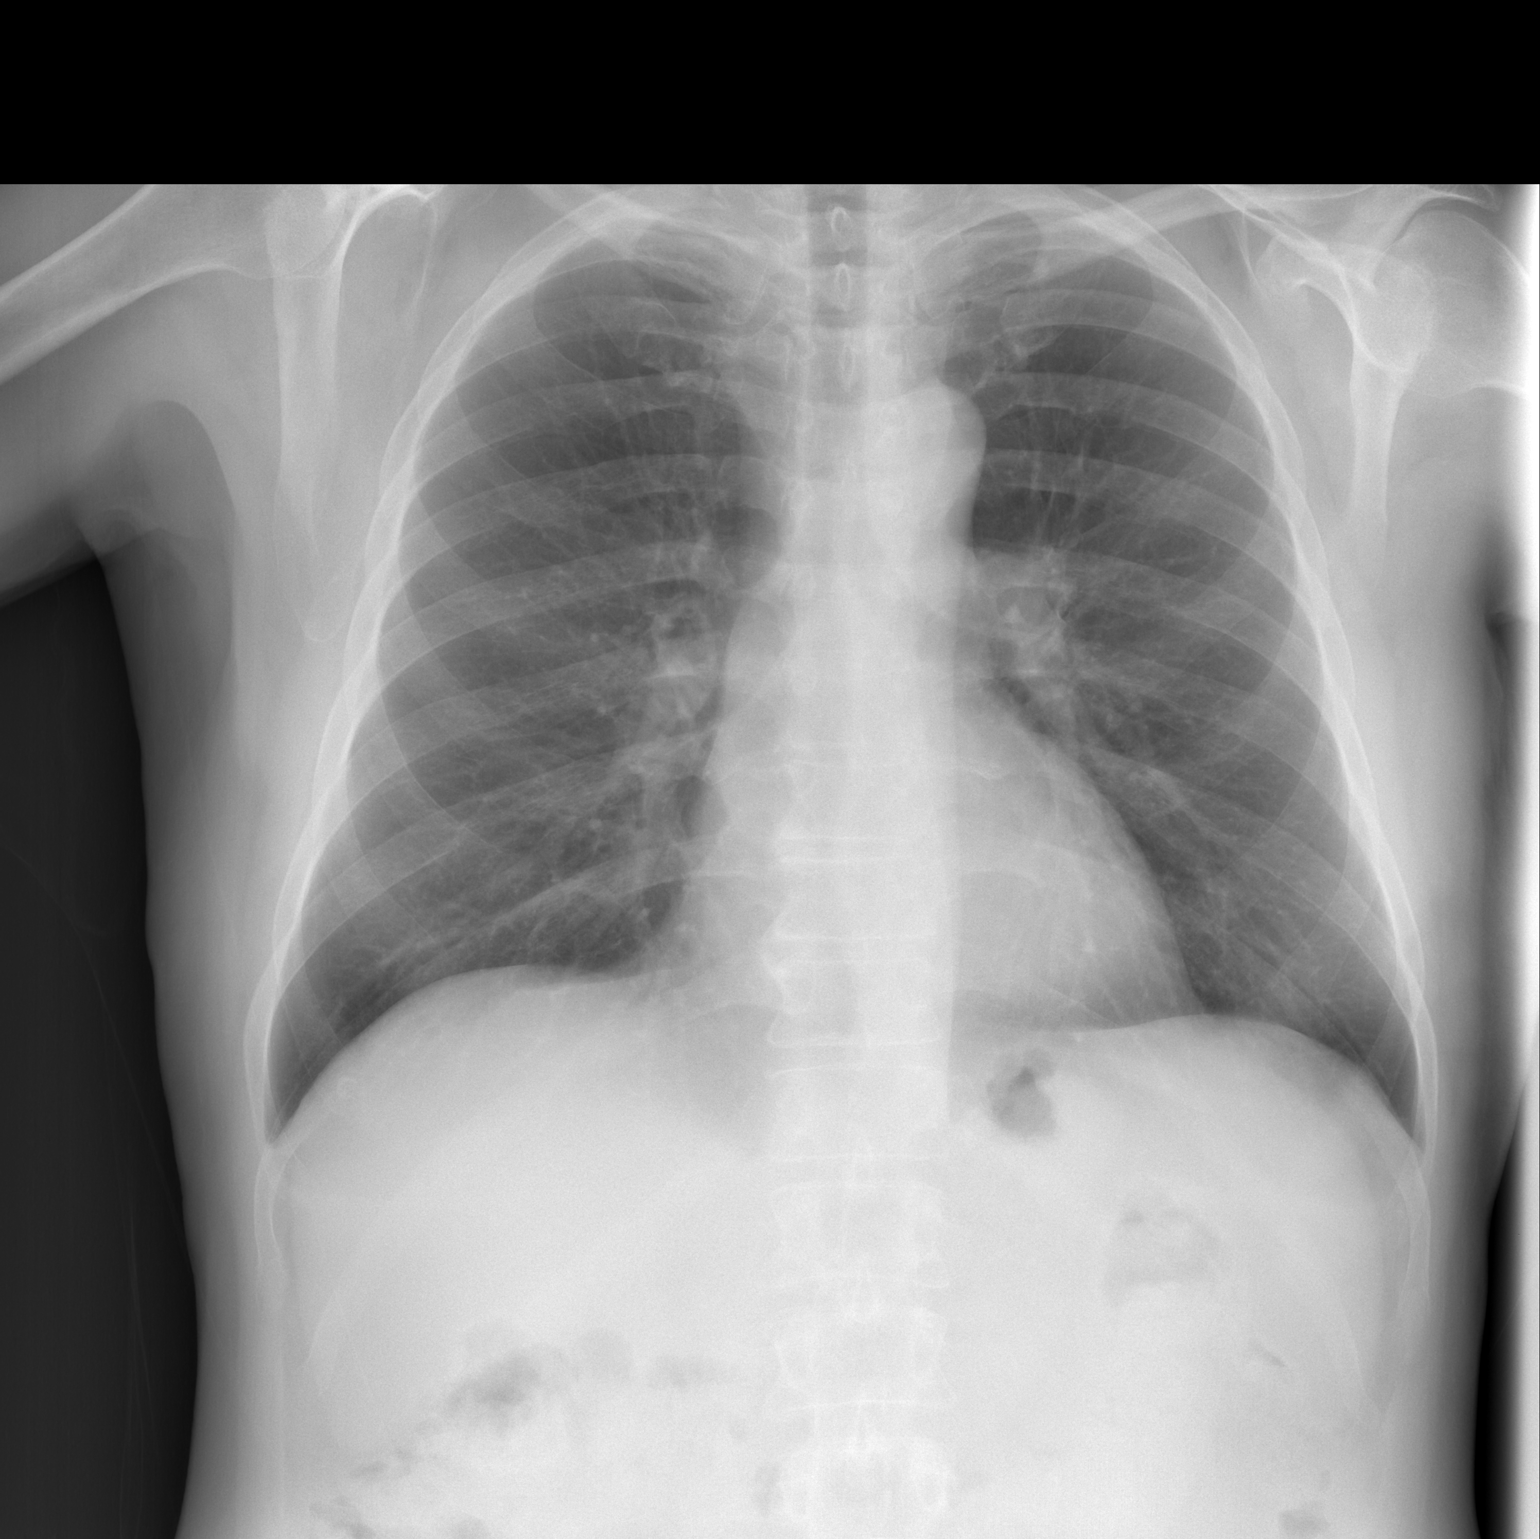

[w chest lat]
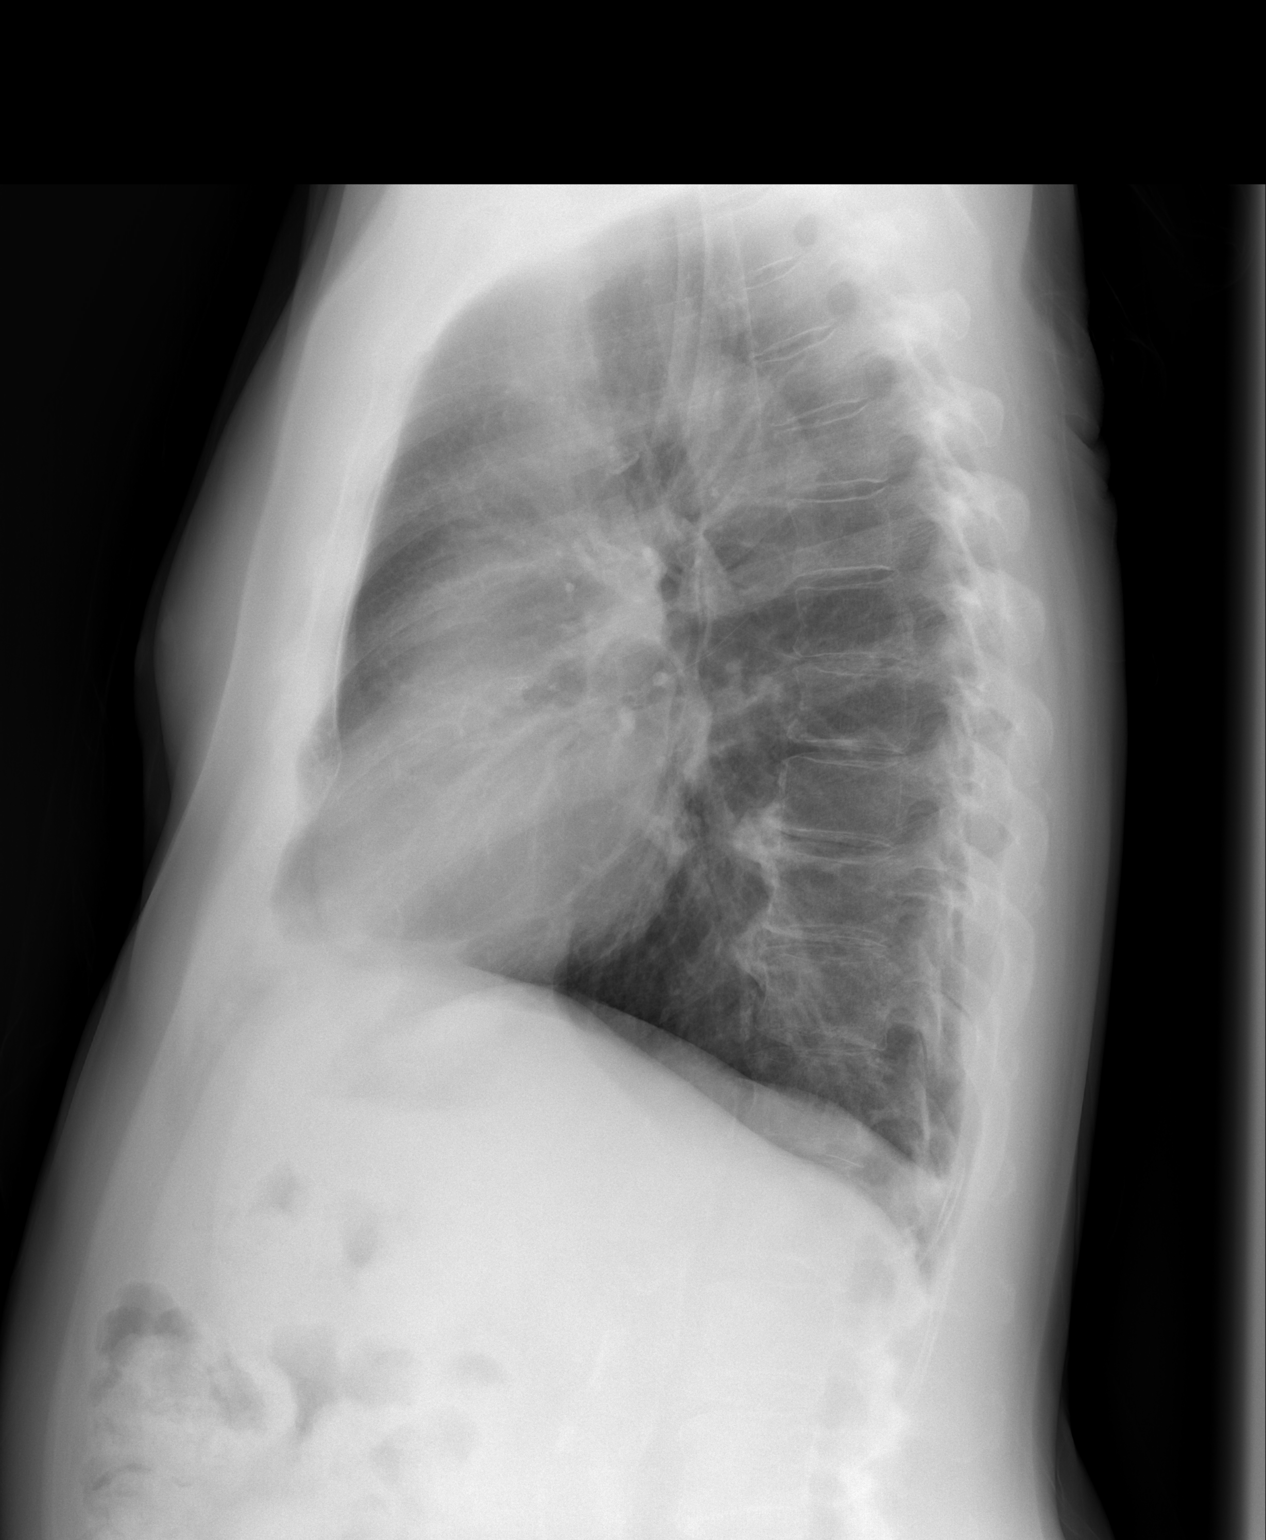

[2 of 2 positions shown; findings below may reference images not displayed]

FINDINGS: Cardiomediastinal silhouette is unremarkable. No acute infiltrate or
pleural effusion. No pulmonary edema. Mild degenerative changes
lower thoracic spine.
IMPRESSION: No active cardiopulmonary disease.

## 2016-03-20 DIAGNOSIS — M5030 Other cervical disc degeneration, unspecified cervical region: Secondary | ICD-10-CM | POA: Diagnosis not present

## 2016-03-20 DIAGNOSIS — M25512 Pain in left shoulder: Secondary | ICD-10-CM | POA: Diagnosis not present

## 2016-03-20 DIAGNOSIS — M542 Cervicalgia: Secondary | ICD-10-CM | POA: Diagnosis not present

## 2016-03-27 ENCOUNTER — Telehealth: Payer: Self-pay | Admitting: *Deleted

## 2016-03-27 NOTE — Telephone Encounter (Signed)
Pt sees his PCP,  Dr. Drema Dallas, for physical next month and asks if he needs to see Dr. Alvy Bimler before he sees Dr. Drema Dallas?  He says he has donated blood at the [facility] about 5 times in the past year.   He says Dr. Drema Dallas will check his CBC and Ferritin.  Instructed pt to make sure Dr. Drema Dallas checks his Ferritin along w/ CBC and send Dr. Alvy Bimler the results.  No need to see Dr. Alvy Bimler until after he has labs done.  Dr. Alvy Bimler can review and then we can let him know if she needs to see him again. Pt was happy with this plan and will have PCP send Dr. Alvy Bimler lab results.

## 2016-04-02 DIAGNOSIS — L570 Actinic keratosis: Secondary | ICD-10-CM | POA: Diagnosis not present

## 2016-04-03 DIAGNOSIS — M25512 Pain in left shoulder: Secondary | ICD-10-CM | POA: Diagnosis not present

## 2016-04-03 DIAGNOSIS — M5030 Other cervical disc degeneration, unspecified cervical region: Secondary | ICD-10-CM | POA: Diagnosis not present

## 2016-04-07 DIAGNOSIS — S51012A Laceration without foreign body of left elbow, initial encounter: Secondary | ICD-10-CM | POA: Diagnosis not present

## 2016-04-15 DIAGNOSIS — H0289 Other specified disorders of eyelid: Secondary | ICD-10-CM | POA: Diagnosis not present

## 2016-04-15 DIAGNOSIS — L718 Other rosacea: Secondary | ICD-10-CM | POA: Diagnosis not present

## 2016-04-15 DIAGNOSIS — H02836 Dermatochalasis of left eye, unspecified eyelid: Secondary | ICD-10-CM | POA: Diagnosis not present

## 2016-05-07 ENCOUNTER — Ambulatory Visit: Payer: PPO | Admitting: Cardiovascular Disease

## 2016-05-19 DIAGNOSIS — E7800 Pure hypercholesterolemia, unspecified: Secondary | ICD-10-CM | POA: Diagnosis not present

## 2016-05-19 DIAGNOSIS — R809 Proteinuria, unspecified: Secondary | ICD-10-CM | POA: Diagnosis not present

## 2016-05-19 DIAGNOSIS — M1712 Unilateral primary osteoarthritis, left knee: Secondary | ICD-10-CM | POA: Diagnosis not present

## 2016-05-19 DIAGNOSIS — Z125 Encounter for screening for malignant neoplasm of prostate: Secondary | ICD-10-CM | POA: Diagnosis not present

## 2016-05-19 DIAGNOSIS — D649 Anemia, unspecified: Secondary | ICD-10-CM | POA: Diagnosis not present

## 2016-05-19 DIAGNOSIS — I10 Essential (primary) hypertension: Secondary | ICD-10-CM | POA: Diagnosis not present

## 2016-05-26 DIAGNOSIS — E7800 Pure hypercholesterolemia, unspecified: Secondary | ICD-10-CM | POA: Diagnosis not present

## 2016-05-26 DIAGNOSIS — D649 Anemia, unspecified: Secondary | ICD-10-CM | POA: Diagnosis not present

## 2016-05-26 DIAGNOSIS — N400 Enlarged prostate without lower urinary tract symptoms: Secondary | ICD-10-CM | POA: Diagnosis not present

## 2016-05-26 DIAGNOSIS — Z8589 Personal history of malignant neoplasm of other organs and systems: Secondary | ICD-10-CM | POA: Diagnosis not present

## 2016-05-26 DIAGNOSIS — R809 Proteinuria, unspecified: Secondary | ICD-10-CM | POA: Diagnosis not present

## 2016-05-26 DIAGNOSIS — I10 Essential (primary) hypertension: Secondary | ICD-10-CM | POA: Diagnosis not present

## 2016-05-26 DIAGNOSIS — R3129 Other microscopic hematuria: Secondary | ICD-10-CM | POA: Diagnosis not present

## 2016-05-26 DIAGNOSIS — I2510 Atherosclerotic heart disease of native coronary artery without angina pectoris: Secondary | ICD-10-CM | POA: Diagnosis not present

## 2016-05-26 DIAGNOSIS — M064 Inflammatory polyarthropathy: Secondary | ICD-10-CM | POA: Diagnosis not present

## 2016-05-26 DIAGNOSIS — Z0000 Encounter for general adult medical examination without abnormal findings: Secondary | ICD-10-CM | POA: Diagnosis not present

## 2016-05-26 DIAGNOSIS — Z23 Encounter for immunization: Secondary | ICD-10-CM | POA: Diagnosis not present

## 2016-05-26 DIAGNOSIS — Z148 Genetic carrier of other disease: Secondary | ICD-10-CM | POA: Diagnosis not present

## 2016-06-02 DIAGNOSIS — D649 Anemia, unspecified: Secondary | ICD-10-CM | POA: Diagnosis not present

## 2016-06-02 DIAGNOSIS — N400 Enlarged prostate without lower urinary tract symptoms: Secondary | ICD-10-CM | POA: Diagnosis not present

## 2016-06-08 DIAGNOSIS — L570 Actinic keratosis: Secondary | ICD-10-CM | POA: Diagnosis not present

## 2016-06-25 ENCOUNTER — Encounter: Payer: Self-pay | Admitting: Cardiovascular Disease

## 2016-06-25 ENCOUNTER — Ambulatory Visit (INDEPENDENT_AMBULATORY_CARE_PROVIDER_SITE_OTHER): Payer: PPO | Admitting: Cardiovascular Disease

## 2016-06-25 VITALS — BP 184/92 | HR 65 | Ht 64.0 in | Wt 158.4 lb

## 2016-06-25 DIAGNOSIS — I10 Essential (primary) hypertension: Secondary | ICD-10-CM | POA: Diagnosis not present

## 2016-06-25 DIAGNOSIS — E7800 Pure hypercholesterolemia, unspecified: Secondary | ICD-10-CM

## 2016-06-25 DIAGNOSIS — I25118 Atherosclerotic heart disease of native coronary artery with other forms of angina pectoris: Secondary | ICD-10-CM

## 2016-06-25 NOTE — Patient Instructions (Signed)
Dr Croitoru recommends that you schedule a follow-up appointment in 1 year. You will receive a reminder letter in the mail two months in advance. If you don't receive a letter, please call our office to schedule the follow-up appointment.  If you need a refill on your cardiac medications before your next appointment, please call your pharmacy. 

## 2016-06-25 NOTE — Progress Notes (Signed)
Cardiology Office Note    Date:  06/26/2016   ID:  Corey Garcia, Corey Garcia [birth year], MRN [id number]  PCP:  [name], MD  Cardiologist:   Sanda Klein, MD   chief complaint: CAD follow up   History of Present Illness:  Lakai Garcia is a 70 y.o. male with CAD, hypertension, hyperlipidemia and recently diagnosed hemochromatosis presents for routine follow-up. He has no cardiac complaints and specifically denies chest pain, dyspnea, syncope, palpitations, leg edema, claudication or focal neurological events. After donating blood on about 6 or 7 occasions his ferritin improved to the point that he no longer requires phlebotomies. He has easy bruising but has not had overt serious bleeding.  He has noticed that his blood pressure tends to be high in the mornings but becomes normal in the afternoons. He is currently taking a total of 7.5 mg of bisoprolol every morning.  He is planning a trip to [place] to see his brother who is struggling with lymphoma and has just completed chemotherapy. They're planning to go on a cruise together.   In 2009 had a high-grade stenosis of the mid LAD artery as well as an ostial stenosis of the circumflex-OM and underwent two-vessel PCI presents with cutting balloon atherectomy of the OM 2 and 3.0 x 13 mm drug-eluting Cypher stent to the LAD artery. He also had a 50% right coronary artery stenosis. He has not had any coronary symptoms since then. In April of 2014 he had a low-risk nuclear stress test without perfusion defects with normal ejection fraction. His coronary risk factors are hypertension and hyperlipidemia both well addressed with pharmacological therapy.  he has a history of photosensitivity with lisinopril.  Past Medical History:  Diagnosis Date  . Alcoholism ([place]) 03/15/2014  . Bradycardia by electrocardiogram    BB decreased 11/12/11  . CAD (coronary artery disease)    last cath=01/2008, PCI; last nuc=08/09/08, normal  .  Dyslipidemia   . ED (erectile dysfunction)    viagra helps  . HTN (hypertension)   . Iron overload 03/15/2014  . 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 Murmur, cardiac    faint early systolic grade 1/6 aortic ejection murmur  . Rib pain 04/16/2014  . Skin cancer    basal cell ca X 4    Past Surgical History:  Procedure Laterality Date  . CORONARY ANGIOPLASTY WITH STENT PLACEMENT  01/31/2008   PCI with cutting balloon arthrectomy of the ostium of the circ obtuse marg 2 vessel  . CORONARY ANGIOPLASTY WITH STENT PLACEMENT  01/27/08   nl LV function, PTCA/stenting of LAD with 3x26mm Cypher post dilated 3.38mm  . HERNIA REPAIR    . NASAL SEPTUM SURGERY      Current Medications: Outpatient Medications Prior to Visit  Medication Sig Dispense Refill  . aspirin 81 MG tablet Take 81 mg by mouth daily.    Marland Kitchen atorvastatin (LIPITOR) 20 MG tablet Take 20 mg by mouth daily.    . bisoprolol (ZEBETA) 5 MG tablet Take by mouth daily. Pt take 1.5 tablets daily    . Clobetasol Prop Emollient Base (CLOBETASOL PROPIONATE E) 0.05 % emollient cream Apply 1 application topically daily as needed.    . Multiple Vitamins-Minerals (ONE DAILY MULTIVITAMIN MEN PO) Take by mouth daily.    . tacrolimus (PROTOPIC) 0.03 % ointment Apply topically as needed.    . tamsulosin (FLOMAX) 0.4 MG CAPS capsule Take 0.4 mg by mouth daily.     No facility-administered medications prior  to visit.      Allergies:   Codeine; Lisinopril; and Thiazide-type diuretics   Social History   Social History  . Marital status: Married    Spouse name: N/A  . Number of children: N/A  . Years of education: N/A   Occupational History  . printer maintenance tech    Social History Main Topics  . Smoking status: Former Smoker    Quit date: 10/31/1982  . Smokeless tobacco: Never Used  . Alcohol use 12.0 oz/week    20 Cans of beer per week  . Drug use: No  . Sexual activity: Not Asked   Other Topics Concern  . None    Social History Narrative  . None     Family History:  The patient's family history includes Heart failure in his brother, father, and mother.   ROS:   Please see the history of present illness.    ROS All other systems reviewed and are negative.   PHYSICAL EXAM:   VS:  BP (!) 184/92   Pulse 65   Ht 5\' 4"  (1.626 m)   Wt 158 lb 6.4 oz (71.8 kg)   BMI 27.19 kg/m    GEN: Well nourished, well developed, in no acute distress  HEENT: normal  Neck: no JVD, carotid bruits, or masses Cardiac: RRR; no murmurs, rubs, or gallops,no edema  Respiratory:  clear to auscultation bilaterally, normal work of breathing GI: soft, nontender, nondistended, + BS MS: no deformity or atrophy  Skin: warm and dry, no rash, superficial bruises on forearms and knees Neuro:  Alert and Oriented x 3, Strength and sensation are intact Psych: euthymic mood, full affect  Wt Readings from Last 3 Encounters:  06/25/16 158 lb 6.4 oz (71.8 kg)  06/26/15 152 lb (68.9 kg)  05/10/15 153 lb 9.6 oz (69.7 kg)      Studies/Labs Reviewed:   EKG:  EKG is ordered today.  The ekg ordered today demonstrates Normal sinus rhythm, QS in leads V1 and V2, otherwise normal. QTC 384 ms  Recent Labs: Creatinine 1.05, TSH 2.46, glucose 90  Lipid Panel Labs from September 2017: Cholesterol 147, LDL 58, HDL 66, triglycerides 117    ASSESSMENT:    1. Coronary artery disease involving native coronary artery of native heart with other form of angina pectoris ([place])   2. Pure hypercholesterolemia   3. Essential hypertension   4. Hereditary hemochromatosis ([place])      PLAN:  In order of problems listed above:  1. CAD: Asymptomatic, without need for revascularization in about 8 years. He is on appropriate treatment with aspirin, beta blocker and statin. He is only slightly overweight. He is a nonsmoker and does not have diabetes. 2. HLP: All lipid parameters in the desirable range on current dose of atorvastatin. 3. HTN:  Is possible that his bisoprolol is simply not lasting 24 hours. Asked him to take 5 mg in the morning and 2.5 mg at night 4. Hemochromatosis: Without evidence of liver or cardiac end organ damage. He may have to restart performing phlebotomies in the future.    Medication Adjustments/Labs and Tests Ordered: Current medicines are reviewed at length with the patient today.  Concerns regarding medicines are outlined above.  Medication changes, Labs and Tests ordered today are listed in the Patient Instructions below. Patient Instructions  Dr Sallyanne Kuster recommends that you schedule a follow-up appointment in 1 year. You will receive a reminder letter in the mail two months in advance. If you don't receive a  letter, please call our office to schedule the follow-up appointment.  If you need a refill on your cardiac medications before your next appointment, please call your pharmacy.    Signed, Sanda Klein, MD  06/26/2016 6:31 PM    [facility] [address], [place], [place]  [address] Phone: [phone]; Fax: [phone]

## 2016-06-29 ENCOUNTER — Encounter: Payer: Self-pay | Admitting: Cardiovascular Disease

## 2016-06-30 ENCOUNTER — Telehealth: Payer: Self-pay | Admitting: *Deleted

## 2016-06-30 NOTE — Telephone Encounter (Signed)
Patient reports bp readings as follows, 06-25-2016  160/93/74; 135/86/78;126/75/72  06/26/2016 187/108/75; 133/79/79;123/75/80  06/27/2016 130/89/83;129/82/82;136/77/78  06/28/2016 131/71/89   He is currently taking bisoprolol 10 mg in the morning and 5 mg in the evening. He continues to find his bp elevated in the mornings. He questions if he should increase the bisoprolol to 10 mg twice daily. Will forward to dr croitoru to review and advise.

## 2016-06-30 NOTE — Telephone Encounter (Signed)
I am actually seeing a pattern of improvement. Let's wait until he returns from [place] before making additional changes. Please ask him to send me some more BP recordings after he returns.

## 2016-06-30 NOTE — Telephone Encounter (Signed)
Spoke with pt wife, aware of dr croitoru's recommendations. They will continue to monitor. They are back in [place] at this time.

## 2016-07-04 ENCOUNTER — Encounter: Payer: Self-pay | Admitting: Cardiovascular Disease

## 2016-08-05 DIAGNOSIS — M810 Age-related osteoporosis without current pathological fracture: Secondary | ICD-10-CM | POA: Diagnosis not present

## 2016-08-05 DIAGNOSIS — M1712 Unilateral primary osteoarthritis, left knee: Secondary | ICD-10-CM | POA: Diagnosis not present

## 2016-08-05 DIAGNOSIS — M8589 Other specified disorders of bone density and structure, multiple sites: Secondary | ICD-10-CM | POA: Diagnosis not present

## 2016-08-05 DIAGNOSIS — E559 Vitamin D deficiency, unspecified: Secondary | ICD-10-CM | POA: Diagnosis not present

## 2016-08-19 DIAGNOSIS — M1712 Unilateral primary osteoarthritis, left knee: Secondary | ICD-10-CM | POA: Diagnosis not present

## 2016-08-19 DIAGNOSIS — E559 Vitamin D deficiency, unspecified: Secondary | ICD-10-CM | POA: Diagnosis not present

## 2016-09-10 DIAGNOSIS — L570 Actinic keratosis: Secondary | ICD-10-CM | POA: Diagnosis not present

## 2016-10-31 ENCOUNTER — Encounter (HOSPITAL_COMMUNITY): Payer: Self-pay | Admitting: Emergency Medicine

## 2016-10-31 ENCOUNTER — Emergency Department (HOSPITAL_COMMUNITY)
Admission: EM | Admit: 2016-10-31 | Discharge: 2016-10-31 | Disposition: A | Discharge status: Home / Self Care | Payer: PPO | Attending: Emergency Medicine | Admitting: Emergency Medicine

## 2016-10-31 DIAGNOSIS — Z85828 Personal history of other malignant neoplasm of skin: Secondary | ICD-10-CM | POA: Insufficient documentation

## 2016-10-31 DIAGNOSIS — Z87891 Personal history of nicotine dependence: Secondary | ICD-10-CM | POA: Diagnosis not present

## 2016-10-31 DIAGNOSIS — Z7982 Long term (current) use of aspirin: Secondary | ICD-10-CM | POA: Insufficient documentation

## 2016-10-31 DIAGNOSIS — Z955 Presence of coronary angioplasty implant and graft: Secondary | ICD-10-CM | POA: Diagnosis not present

## 2016-10-31 DIAGNOSIS — R55 Syncope and collapse: Secondary | ICD-10-CM | POA: Insufficient documentation

## 2016-10-31 DIAGNOSIS — I2510 Atherosclerotic heart disease of native coronary artery without angina pectoris: Secondary | ICD-10-CM | POA: Insufficient documentation

## 2016-10-31 DIAGNOSIS — I10 Essential (primary) hypertension: Secondary | ICD-10-CM | POA: Insufficient documentation

## 2016-10-31 DIAGNOSIS — R404 Transient alteration of awareness: Secondary | ICD-10-CM | POA: Diagnosis not present

## 2016-10-31 DIAGNOSIS — K219 Gastro-esophageal reflux disease without esophagitis: Secondary | ICD-10-CM | POA: Diagnosis not present

## 2016-10-31 DIAGNOSIS — R42 Dizziness and giddiness: Secondary | ICD-10-CM | POA: Diagnosis not present

## 2016-10-31 LAB — CBC
HEMATOCRIT: 37.9 % — AB (ref 39.0–52.0)
HEMOGLOBIN: 12.8 g/dL — AB (ref 13.0–17.0)
MCH: 34 pg (ref 26.0–34.0)
MCHC: 33.8 g/dL (ref 30.0–36.0)
MCV: 100.5 fL — ABNORMAL HIGH (ref 78.0–100.0)
Platelets: 162 10*3/uL (ref 150–400)
RBC: 3.77 MIL/uL — ABNORMAL LOW (ref 4.22–5.81)
RDW: 12 % (ref 11.5–15.5)
WBC: 3.8 10*3/uL — ABNORMAL LOW (ref 4.0–10.5)

## 2016-10-31 LAB — URINALYSIS, ROUTINE W REFLEX MICROSCOPIC
BILIRUBIN URINE: NEGATIVE
Glucose, UA: NEGATIVE mg/dL
Hgb urine dipstick: NEGATIVE
Ketones, ur: NEGATIVE mg/dL
LEUKOCYTES UA: NEGATIVE
NITRITE: NEGATIVE
PH: 6 (ref 5.0–8.0)
Protein, ur: NEGATIVE mg/dL
SPECIFIC GRAVITY, URINE: 1.005 (ref 1.005–1.030)

## 2016-10-31 LAB — COMPREHENSIVE METABOLIC PANEL
ALT: 59 U/L (ref 17–63)
AST: 97 U/L — AB (ref 15–41)
Albumin: 3.2 g/dL — ABNORMAL LOW (ref 3.5–5.0)
Alkaline Phosphatase: 56 U/L (ref 38–126)
Anion gap: 13 (ref 5–15)
BUN: 8 mg/dL (ref 6–20)
CHLORIDE: 98 mmol/L — AB (ref 101–111)
CO2: 21 mmol/L — AB (ref 22–32)
Calcium: 8.4 mg/dL — ABNORMAL LOW (ref 8.9–10.3)
Creatinine, Ser: 0.79 mg/dL (ref 0.61–1.24)
GFR calc Af Amer: >60 mL/min (ref >60)
GFR calc non Af Amer: >60 mL/min (ref >60)
GLUCOSE: 108 mg/dL — AB (ref 65–99)
POTASSIUM: 3.7 mmol/L (ref 3.5–5.1)
SODIUM: 132 mmol/L — AB (ref 135–145)
Total Bilirubin: 0.5 mg/dL (ref 0.3–1.2)
Total Protein: 6.5 g/dL (ref 6.5–8.1)

## 2016-10-31 LAB — LIPASE, BLOOD: Lipase: 33 U/L (ref 11–51)

## 2016-10-31 LAB — I-STAT TROPONIN, ED
TROPONIN I, POC: 0.02 ng/mL (ref 0.00–0.08)
Troponin i, poc: 0.02 ng/mL (ref 0.00–0.08)

## 2016-10-31 LAB — CBG MONITORING, ED: Glucose-Capillary: 105 mg/dL — ABNORMAL HIGH (ref 65–99)

## 2016-10-31 MED ORDER — GI COCKTAIL ~~LOC~~
30.0000 mL | Freq: Once | ORAL | Status: AC
  Administered 2016-10-31: 30 mL via ORAL
  Filled 2016-10-31: qty 30

## 2016-10-31 NOTE — ED Provider Notes (Addendum)
[facility] DEPT Provider Note   CSN: [id number] Arrival date & time: 10/31/16  0250   By signing my name below, I, Corey Garcia, attest that this documentation has been prepared under the direction and in the presence of Corey Speak, MD  Electronically Signed: Delton Garcia, ED Scribe. 10/31/16. 3:39 AM.  History   Chief Complaint Chief Complaint  Patient presents with  . Loss of Consciousness  . Dizziness    The history is provided by the patient. No language interpreter was used.  Loss of Consciousness   This is a new problem. The current episode started 1 to 2 hours ago. He lost consciousness for a period of less than one minute. Associated symptoms include chest pain. Pertinent negatives include abdominal pain, diaphoresis and nausea. He has tried nothing for the symptoms. The treatment provided no relief.   HPI Comments:  Corey Garcia is a 71 y.o. male, with a hx of HTN, CAD and PSHx of coronary angioplasty and stent placement in 2009, who presents to the Emergency Department s/p a syncopal episode which occurred PTA. He states he awoke with burning chest pain which he described as heartburn, went to get out of bed and lost consciousness. His chest pain is worse with deep breathing. Relative notes the pt was confused prior to EMS arrival. Pt also reports 4 episodes of diarrhea. No alleviating factors noted. Pt denies a hx of frequent heartburn, diaphoresis, SOB, nausea, abdominal pain or any other associated symptoms. Pt denies a hx of   Past Medical History:  Diagnosis Date  . Alcoholism ([place]) 03/15/2014  . Bradycardia by electrocardiogram    BB decreased 11/12/11  . CAD (coronary artery disease)    last cath=01/2008, PCI; last nuc=08/09/08, normal  . Dyslipidemia   . ED (erectile dysfunction)    viagra helps  . HTN (hypertension)   . Iron overload 03/15/2014  . 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 Murmur, cardiac    faint early systolic grade 1/6 aortic  ejection murmur  . Rib pain 04/16/2014  . Skin cancer    basal cell ca X 4    Patient Active Problem List   Diagnosis Date Noted  . Elevated liver enzymes 05/10/2015  . Preoperative cardiovascular examination 06/29/2014  . Rib pain 04/16/2014  . Liver disease due to alcohol ([place]) 03/15/2014  . Alcoholism ([place]) 03/15/2014  . Hemochromatosis 03/15/2014  . Anemia of chronic illness 03/15/2014  . Leukopenia 03/15/2014  . Hyponatremia 03/15/2014  . Basal cell carcinoma 03/15/2014  . CAD (coronary artery disease) 01/12/2014  . Hyperlipidemia 01/12/2014  . Essential hypertension 01/12/2014  . Nocturia 01/12/2014    Past Surgical History:  Procedure Laterality Date  . CORONARY ANGIOPLASTY WITH STENT PLACEMENT  01/31/2008   PCI with cutting balloon arthrectomy of the ostium of the circ obtuse marg 2 vessel  . CORONARY ANGIOPLASTY WITH STENT PLACEMENT  01/27/08   nl LV function, PTCA/stenting of LAD with 3x60mm Cypher post dilated 3.77mm  . HERNIA REPAIR    . NASAL SEPTUM SURGERY      Home Medications    Prior to Admission medications   Medication Sig Start Date End Date Taking? Authorizing Provider  aspirin 81 MG tablet Take 81 mg by mouth daily.    Historical Provider, MD  atorvastatin (LIPITOR) 20 MG tablet Take 20 mg by mouth daily.    Historical Provider, MD  bisoprolol (ZEBETA) 5 MG tablet Take by mouth daily. Pt take 1.5 tablets daily 05/28/14  Mihai Croitoru, MD  Clobetasol Prop Emollient Base (CLOBETASOL PROPIONATE E) 0.05 % emollient cream Apply 1 application topically daily as needed.    Historical Provider, MD  loratadine (CLARITIN) 10 MG tablet Take 1 tablet by mouth daily.    Historical Provider, MD  Multiple Vitamins-Minerals (ONE DAILY MULTIVITAMIN MEN PO) Take by mouth daily.    Historical Provider, MD  tacrolimus (PROTOPIC) 0.03 % ointment Apply topically as needed.    Historical Provider, MD  tamsulosin (FLOMAX) 0.4 MG CAPS capsule Take 0.4 mg by mouth daily.     Historical Provider, MD    Family History Family History  Problem Relation Age of Onset  . Heart failure Mother     heart disease  . Heart failure Father     heart disease  . Heart failure Brother     "bad heart valve"    Social History Social History  Substance Use Topics  . Smoking status: Former Smoker    Quit date: 10/31/1982  . Smokeless tobacco: Never Used  . Alcohol use 12.0 oz/week    20 Cans of beer per week     Allergies   Codeine; Lisinopril; and Thiazide-type diuretics   Review of Systems Review of Systems  Constitutional: Negative for diaphoresis.  Respiratory: Negative for shortness of breath.   Cardiovascular: Positive for chest pain and syncope.  Gastrointestinal: Positive for diarrhea. Negative for abdominal pain and nausea.  Neurological: Positive for syncope.  All other systems reviewed and are negative.  Physical Exam Updated Vital Signs BP 138/75 (BP Location: Left Arm)   Pulse 68   Temp 97.6 F (36.4 C) (Oral)   Resp 18   Ht 5\' 5"  (1.651 m)   Wt 157 lb (71.2 kg)   SpO2 98%   BMI 26.13 kg/m   Physical Exam  Constitutional: He is oriented to person, place, and time. He appears well-developed and well-nourished.  HENT:  Head: Normocephalic and atraumatic.  Eyes: EOM are normal.  Neck: Normal range of motion.  Cardiovascular: Normal rate, regular rhythm, normal heart sounds and intact distal pulses.   Pulmonary/Chest: Effort normal and breath sounds normal. No respiratory distress.  Abdominal: Soft. He exhibits no distension. There is no tenderness.  Musculoskeletal: Normal range of motion.  Neurological: He is alert and oriented to person, place, and time.  Skin: Skin is warm and dry.  Psychiatric: He has a normal mood and affect. Judgment normal.  Nursing note and vitals reviewed.  ED Treatments / Results  DIAGNOSTIC STUDIES:  Oxygen Saturation is 98% on RA, normal by my interpretation.    COORDINATION OF CARE:  3:38 AM  Discussed treatment plan with pt at bedside and pt agreed to plan.  Labs (all labs ordered are listed, but only abnormal results are displayed) Labs Reviewed  BASIC METABOLIC PANEL  CBC  URINALYSIS, ROUTINE W REFLEX MICROSCOPIC  CBG MONITORING, ED    EKG  EKG Interpretation  Date/Time:  Saturday October 31 2016 03:21:19 EST Ventricular Rate:  66 PR Interval:    QRS Duration: 78 QT Interval:  407 QTC Calculation: 427 R Axis:   -9 Text Interpretation:  Sinus rhythm Low voltage, precordial leads Otherwise within normal limits When compared with ECG of 12/14/2012, Premature ventricular complexes are no longer present Confirmed by [facility]  MD, DAVID ([id number]) on 10/31/2016 3:33:56 AM       Radiology No results found.  Procedures Procedures (including critical care time)  Medications Ordered in ED Medications - No data to display  Initial Impression / Assessment and Plan / ED Course  I have reviewed the triage vital signs and the nursing notes.  Pertinent labs & imaging results that were available during my care of the patient were reviewed by me and considered in my medical decision making (see chart for details).  Patient presents here with complaints of syncope. He reports experiencing episodes of acid reflux yesterday. He reports this started after eating a bowl of soup made with a "home grown onions". This evening while lying in bed asleep, he woke to the sensation of acid in his throat and mouth and sat up quickly. He then had some sort of syncopal episode and fell to the floor. He denies any injury in the fall. He is now essentially symptom-free.  His workup reveals unremarkable laboratory studies. His EKG is unchanged and troponin is negative 2. He was given a GI cocktail and his symptoms have improved.  His symptoms sound most consistent with a gastro-intestinal etiology. I doubt a cardiac etiology even though the patient does have a cardiac history. His symptoms are  inconsistent with his prior cardiac symptoms. He will be discharged to home, to follow-up with his cardiologist this week. He understands to return if his symptoms worsen or change.  Final Clinical Impressions(s) / ED Diagnoses   Final diagnoses:  None    New Prescriptions New Prescriptions   No medications on file  I personally performed the services described in this documentation, which was scribed in my presence. The recorded information has been reviewed and is accurate.        Corey Speak, MD 10/31/16 [place], MD 10/31/16 [phone]

## 2016-10-31 NOTE — ED Notes (Signed)
Pt up at bedside, attempting to provide urine sample.

## 2016-10-31 NOTE — ED Notes (Signed)
ED Provider at bedside. 

## 2016-10-31 NOTE — Discharge Instructions (Signed)
Prilosec 20 mg once daily.  Follow-up with your cardiologist this week to discuss a possible stress test.  Return to the emergency department in the meantime if your symptoms significantly worsen or change.

## 2016-10-31 NOTE — ED Triage Notes (Signed)
Pt BIB EMS from home. Pt woke up tonight with bad indigestion. Went to get up out of bed, became dizzy upon standing, collapsed to his knees and passed out on the side of his bed.Denies having struck his head or pain other than indigestion. Has hx of cardiac issues, indigestion. Pt states indigestion pain incr upon deep inspiration. Had episode of diarrhea with syncopal episode, but reports same earlier in the day after eating some chili. +ETOH tonight, approx 6-7 beers.

## 2016-11-09 ENCOUNTER — Ambulatory Visit (INDEPENDENT_AMBULATORY_CARE_PROVIDER_SITE_OTHER): Payer: PPO | Admitting: Physician Assistant

## 2016-11-09 ENCOUNTER — Encounter: Payer: Self-pay | Admitting: Physician Assistant

## 2016-11-09 ENCOUNTER — Ambulatory Visit (INDEPENDENT_AMBULATORY_CARE_PROVIDER_SITE_OTHER): Payer: PPO

## 2016-11-09 VITALS — BP 140/68 | HR 80 | Ht 65.0 in | Wt 162.0 lb

## 2016-11-09 DIAGNOSIS — I2510 Atherosclerotic heart disease of native coronary artery without angina pectoris: Secondary | ICD-10-CM

## 2016-11-09 DIAGNOSIS — R55 Syncope and collapse: Secondary | ICD-10-CM

## 2016-11-09 NOTE — Progress Notes (Signed)
Cardiology Office Note   Date:  11/09/2016   ID:  Corey Garcia, DOB [birth year], MRN [id number]  PCP:  [name], MD  Cardiologist:  Dr. Sallyanne Kuster, 06/25/2016  Rosaria Ferries, PA-C   Chief Complaint  Patient presents with  . [place] F/u    pt states ED visit for syncope; pt states no Sx since hosp.    History of Present Illness: Corey Garcia is a 71 y.o. male with a history of hemachromatosis, HTN, HLD, 2009 STEMI s/p cath with 3.0 x 13 mm Cypher DES & staged cutting balloon atherectomy of the ostial OM 2, low risk Myoview in 2014, EF normal  10/31/2016 visit for syncope and chest pain, symptoms improved with GI cocktail, appointment made  Corey Garcia presents for Cardiology evaluation and follow up.  He now works close to home. He never uses salt, but admits that he eats out and is getting Na+ in the foods he eats.   He walks 30 minutes a day, also goes to the gym once/twice per week. This past week, he did not do as much because his L knee was sore after the syncope/fall. He never gets chest pain with exertion.  He is aware his weight is up and wants to work on that. No LE edema, no orthopnea or PND.  The syncope episode started when he woke up having to urinate. He got out of bed. The next thing he remembers is being on his knees. He did not fall completely down. He called for help, his wife and grandson were there. He had been incontinent of bowel but not urine.   His wife and grandson described him standing up, holding on to the bed, being confused, saliva on his face.   He normally gets out of bed by sitting on the side of the bed for a few seconds, then walking slowly. He has not had presyncope or syncope before.    He had no palpitations, no awareness of his heart skipping or racing. He has never had any other episodes of presyncope or syncope.   Past Medical History:  Diagnosis Date  . Alcoholism ([place]) 03/15/2014  . Bradycardia by  electrocardiogram    BB decreased 11/12/11  . CAD (coronary artery disease)    last cath=01/2008, PCI; last nuc=08/09/08, normal  . Dyslipidemia   . ED (erectile dysfunction)    viagra helps  . HTN (hypertension)   . Iron overload 03/15/2014  . 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 Murmur, cardiac    faint early systolic grade 1/6 aortic ejection murmur  . Rib pain 04/16/2014  . Skin cancer    basal cell ca X 4    Past Surgical History:  Procedure Laterality Date  . CORONARY ANGIOPLASTY WITH STENT PLACEMENT  01/31/2008   PCI with cutting balloon arthrectomy of the ostium of the circ obtuse marg 2 vessel  . CORONARY ANGIOPLASTY WITH STENT PLACEMENT  01/27/08   nl LV function, PTCA/stenting of LAD with 3x30mm Cypher post dilated 3.47mm  . HERNIA REPAIR    . NASAL SEPTUM SURGERY      Current Outpatient Prescriptions  Medication Sig Dispense Refill  . aspirin 81 MG tablet Take 81 mg by mouth daily.    Marland Kitchen atorvastatin (LIPITOR) 20 MG tablet Take 20 mg by mouth daily.    . bisoprolol (ZEBETA) 10 MG tablet Take 1.5 tablets by mouth daily.    . Clobetasol Prop Emollient Base (CLOBETASOL PROPIONATE E)  0.05 % emollient cream Apply 1 application topically daily as needed (dermatosis).     . hydrocortisone (PROCTO-MED HC) 2.5 % rectal cream Place 1 application rectally as needed for hemorrhoids or itching.    . loratadine (CLARITIN) 10 MG tablet Take 1 tablet by mouth daily.    . Multiple Vitamins-Minerals (ONE DAILY MULTIVITAMIN MEN PO) Take 1 tablet by mouth daily.     Marland Kitchen omeprazole (PRILOSEC) 20 MG capsule Take 20 mg by mouth daily.    . tacrolimus (PROTOPIC) 0.03 % ointment Apply 1 application topically daily as needed (dermatitis on face).     . tamsulosin (FLOMAX) 0.4 MG CAPS capsule Take 0.4 mg by mouth daily.     No current facility-administered medications for this visit.     Allergies:   Codeine; Lisinopril; and Thiazide-type diuretics    Social History:  The patient   reports that he quit smoking about 34 years ago. He has never used smokeless tobacco. He reports that he drinks about 12.0 oz of alcohol per week . He reports that he does not use drugs.   Family History:  The patient's family history includes Heart failure in his brother, father, and mother.    ROS:  Please see the history of present illness. All other systems are reviewed and negative.    PHYSICAL EXAM: VS:  BP 140/68 (BP Location: Left Arm, Patient Position: Sitting, Cuff Size: Large)   Pulse 80   Ht 5\' 5"  (1.651 m)   Wt 162 lb (73.5 kg)   BMI 26.96 kg/m  , BMI Body mass index is 26.96 kg/m. GEN: Well nourished, well developed, male in no acute distress  HEENT: normal for age  Neck: no JVD, no carotid bruit, no masses Cardiac: RRR; no murmur, no rubs, or gallops; a few irregular beats were heard Respiratory:  clear to auscultation bilaterally, normal work of breathing GI: soft, nontender, nondistended, + BS MS: no deformity or atrophy; no edema; distal pulses are 2+ in all 4 extremities   Skin: warm and dry, no rash Neuro:  Strength and sensation are intact Psych: euthymic mood, full affect   EKG:  EKG is not ordered today. The ECG from the ER visit on 10/31/2016 is reviewed. It shows sinus rhythm with inferior Q waves that are unchanged from previous.   Recent Labs: 10/31/2016: ALT 59; BUN 8; Creatinine, Ser 0.79; Hemoglobin 12.8; Platelets 162; Potassium 3.7; Sodium 132    Lipid Panel    Component Value Date/Time   CHOL  01/27/2008 0652    157        ATP III CLASSIFICATION:  <200     mg/dL   Desirable  200-239  mg/dL   Borderline High  >=240    mg/dL   High   TRIG 52 01/27/2008 0652   HDL 53 01/27/2008 0652   CHOLHDL 3.0 01/27/2008 0652   VLDL 10 01/27/2008 0652   LDLCALC  01/27/2008 0652    94        Total Cholesterol/HDL:CHD Risk Coronary Heart Disease Risk Table                     Men   Women  1/2 Average Risk   3.4   3.3     Wt Readings from Last 3  Encounters:  11/09/16 162 lb (73.5 kg)  10/31/16 157 lb (71.2 kg)  06/25/16 158 lb 6.4 oz (71.8 kg)     Other studies Reviewed: Additional studies/ records that were  reviewed today include: Office notes, hospital records and testing.  ASSESSMENT AND PLAN:  1.  Syncope: He has a mild murmur on exam, not felt significant. He has a generally good exercise tolerance and no ischemic symptoms with exertion. I will order an echocardiogram and follow-up on the results. We will also get an event monitor.  **He was advised that [facility] states that he should not drive for 6 months after an unexplained syncopal episode.  The syncope could have been a vasovagal episode in response to tear to urinary urge. However, he has not previous episodes and may not have any arrhythmia on his event monitor.  2. CAD: He is on good medical therapy and is encouraged to continue this. He is encouraged to continue his exercise program. He gets any symptoms with exertion, he is to contact us. For now although, I do not feel stress testing is indicated.  Current medicines are reviewed at length with the patient today.  The patient does not have concerns regarding medicines.  The following changes have been made:  no change  Labs/ tests ordered today include:   Orders Placed This Encounter  Procedures  . Cardiac event monitor  . ECHOCARDIOGRAM COMPLETE     Disposition:   FU with Dr. Sallyanne Kuster after the monitor is completed  Signed, Lenoard Aden  11/09/2016 2:11 PM    [place] Phone: [phone]; Fax: [phone]  This note was written with the assistance of speech recognition software. Please excuse any transcriptional errors.

## 2016-11-09 NOTE — Progress Notes (Signed)
Thank you MCr 

## 2016-11-09 NOTE — Patient Instructions (Signed)
Medication Instructions: No medication changes  Procedures/Testing: Your physician has requested that you have an echocardiogram. Echocardiography is a painless test that uses sound waves to create images of your heart. It provides your doctor with information about the size and shape of your heart and how well your heart's chambers and valves are working. This procedure takes approximately one hour. There are no restrictions for this procedure. Please have this done at [address], [address]    Follow-Up: Your physician recommends that you schedule a follow-up appointment in: 6 weeks with Dr. Sallyanne Kuster   Your physician has recommended that you wear an event monitor. Event monitors are medical devices that record the heart's electrical activity. Doctors most often [place] these monitors to diagnose arrhythmias. Arrhythmias are problems with the speed or rhythm of the heartbeat. The monitor is a small, portable device. You can wear one while you do your normal daily activities. This is usually used to diagnose what is causing palpitations/syncope (passing out). We have made you an appointment at [address], [address] at 12 today to have this placed.    If you need a refill on your cardiac medications before your next appointment, please call your pharmacy.

## 2016-11-24 ENCOUNTER — Other Ambulatory Visit: Payer: Self-pay

## 2016-11-24 ENCOUNTER — Ambulatory Visit (HOSPITAL_COMMUNITY): Payer: PPO | Attending: Cardiology

## 2016-11-24 DIAGNOSIS — I340 Nonrheumatic mitral (valve) insufficiency: Secondary | ICD-10-CM | POA: Diagnosis not present

## 2016-11-24 DIAGNOSIS — R55 Syncope and collapse: Secondary | ICD-10-CM

## 2016-11-24 DIAGNOSIS — I350 Nonrheumatic aortic (valve) stenosis: Secondary | ICD-10-CM | POA: Insufficient documentation

## 2016-12-10 DIAGNOSIS — L570 Actinic keratosis: Secondary | ICD-10-CM | POA: Diagnosis not present

## 2016-12-23 DIAGNOSIS — M1712 Unilateral primary osteoarthritis, left knee: Secondary | ICD-10-CM | POA: Diagnosis not present

## 2016-12-23 DIAGNOSIS — S83242A Other tear of medial meniscus, current injury, left knee, initial encounter: Secondary | ICD-10-CM | POA: Diagnosis not present

## 2017-01-01 ENCOUNTER — Telehealth: Payer: Self-pay

## 2017-01-01 NOTE — Telephone Encounter (Signed)
[email or web address]

## 2017-01-01 NOTE — Telephone Encounter (Signed)
-----   Message from Sanda Klein, MD sent at 12/23/2016  2:29 PM EDT ----- Normal event monitor. Any more spells?

## 2017-01-07 ENCOUNTER — Encounter: Payer: Self-pay | Admitting: Cardiovascular Disease

## 2017-01-07 ENCOUNTER — Ambulatory Visit (INDEPENDENT_AMBULATORY_CARE_PROVIDER_SITE_OTHER): Payer: PPO | Admitting: Cardiovascular Disease

## 2017-01-07 VITALS — BP 120/62 | HR 80 | Ht 64.0 in | Wt 157.4 lb

## 2017-01-07 DIAGNOSIS — I25118 Atherosclerotic heart disease of native coronary artery with other forms of angina pectoris: Secondary | ICD-10-CM

## 2017-01-07 DIAGNOSIS — E7800 Pure hypercholesterolemia, unspecified: Secondary | ICD-10-CM | POA: Diagnosis not present

## 2017-01-07 DIAGNOSIS — I10 Essential (primary) hypertension: Secondary | ICD-10-CM | POA: Diagnosis not present

## 2017-01-07 DIAGNOSIS — R55 Syncope and collapse: Secondary | ICD-10-CM

## 2017-01-07 NOTE — Progress Notes (Signed)
Cardiology Office Note    Date:  01/08/2017   ID:  Corey Garcia, Corey Garcia 29-Aug-1946, MRN [id number]  PCP:  [name], MD  Cardiologist:   Sanda Klein, MD   chief complaint: CAD follow up   History of Present Illness:  Corey Garcia is a 71 y.o. male with CAD, hypertension, hyperlipidemia and hemochromatosis presents forFollow-up after a syncopal event in early March. He wore 30 day event monitor and no abnormal rhythms are seen, but he did not have any new symptoms of syncope or presyncope.   As far as I can tell from his description of the events, orthostatic hypotension was the most likely explanation. He is taking both of beta blocker and tamsulosin. Usually he gets up very slowly first thing in the morning, on that occasion he jumped out of bed quicker than usual.  He has no cardiac complaints and specifically denies chest pain, dyspnea, syncope, palpitations, leg edema, claudication or focal neurological events.  In 2009 had a high-grade stenosis of the mid LAD artery as well as an ostial stenosis of the circumflex-OM and underwent two-vessel PCI presents with cutting balloon atherectomy of the OM 2 and 3.0 x 13 mm drug-eluting Cypher stent to the LAD artery. He also had a 50% right coronary artery stenosis. He has not had any coronary symptoms since then. In April of 2014 he had a low-risk nuclear stress test without perfusion defects with normal ejection fraction. His coronary risk factors are hypertension and hyperlipidemia both well addressed with pharmacological therapy.  he has a history of photosensitivity with lisinopril.  Past Medical History:  Diagnosis Date  . Alcoholism ([place]) 03/15/2014  . Bradycardia by electrocardiogram    BB decreased 11/12/11  . CAD (coronary artery disease)    last cath=01/2008, PCI; last nuc=08/09/08, normal  . Dyslipidemia   . ED (erectile dysfunction)    viagra helps  . HTN (hypertension)   . Iron overload 03/15/2014  . Liver  disease due to alcohol ([place]) 03/15/2014  . Murmur, cardiac    faint early systolic grade 1/6 aortic ejection murmur  . Rib pain 04/16/2014  . Skin cancer    basal cell ca X 4    Past Surgical History:  Procedure Laterality Date  . CORONARY ANGIOPLASTY WITH STENT PLACEMENT  01/31/2008   PCI with cutting balloon arthrectomy of the ostium of the circ obtuse marg 2 vessel  . CORONARY ANGIOPLASTY WITH STENT PLACEMENT  01/27/08   nl LV function, PTCA/stenting of LAD with 3x71mm Cypher post dilated 3.65mm  . HERNIA REPAIR    . NASAL SEPTUM SURGERY      Current Medications: Outpatient Medications Prior to Visit  Medication Sig Dispense Refill  . aspirin 81 MG tablet Take 81 mg by mouth daily.    Marland Kitchen atorvastatin (LIPITOR) 20 MG tablet Take 20 mg by mouth daily.    . bisoprolol (ZEBETA) 10 MG tablet Take 1.5 tablets by mouth daily.    . Clobetasol Prop Emollient Base (CLOBETASOL PROPIONATE E) 0.05 % emollient cream Apply 1 application topically daily as needed (dermatosis).     . hydrocortisone (PROCTO-MED HC) 2.5 % rectal cream Place 1 application rectally as needed for hemorrhoids or itching.    . loratadine (CLARITIN) 10 MG tablet Take 1 tablet by mouth daily.    . Multiple Vitamins-Minerals (ONE DAILY MULTIVITAMIN MEN PO) Take 1 tablet by mouth daily.     . tacrolimus (PROTOPIC) 0.03 % ointment Apply 1 application topically daily as needed (  dermatitis on face).     . tamsulosin (FLOMAX) 0.4 MG CAPS capsule Take 0.4 mg by mouth daily.    Marland Kitchen omeprazole (PRILOSEC) 20 MG capsule Take 20 mg by mouth daily.     No facility-administered medications prior to visit.      Allergies:   Codeine; Lisinopril; and Thiazide-type diuretics   Social History   Social History  . Marital status: Married    Spouse name: N/A  . Number of children: N/A  . Years of education: N/A   Occupational History  . printer maintenance tech    Social History Main Topics  . Smoking status: Former Smoker    Quit date:  10/31/1982  . Smokeless tobacco: Never Used  . Alcohol use 12.0 oz/week    20 Cans of beer per week  . Drug use: No  . Sexual activity: Not Asked   Other Topics Concern  . None   Social History Narrative  . None     Family History:  The patient's family history includes Heart failure in his brother, father, and mother.   ROS:   Please see the history of present illness.    ROS All other systems reviewed and are negative.   PHYSICAL EXAM:   VS:  BP 120/62   Pulse 80   Ht 5\' 4"  (1.626 m)   Wt 71.4 kg (157 lb 6.4 oz)   BMI 27.02 kg/m    GEN: Well nourished, well developed, in no acute distress  HEENT: normal  Neck: no JVD, carotid bruits, or masses Cardiac: RRR; no murmurs, rubs, or gallops,no edema  Respiratory:  clear to auscultation bilaterally, normal work of breathing GI: soft, nontender, nondistended, + BS MS: no deformity or atrophy  Skin: warm and dry, no rash, superficial bruises on forearms and knees Neuro:  Alert and Oriented x 3, Strength and sensation are intact Psych: euthymic mood, full affect  Wt Readings from Last 3 Encounters:  01/07/17 71.4 kg (157 lb 6.4 oz)  11/09/16 73.5 kg (162 lb)  10/31/16 71.2 kg (157 lb)      Studies/Labs Reviewed:   EKG:  EKG is not ordered today.  The ekg ordered 10/31/16 demonstrates Normal sinus rhythm, QS in leads V1 and V2, otherwise normal.  Recent Labs: 10/31/2016 Creatinine 0.79, glucose 108, normal liver function tests except borderline AST at 97, slightly higher compared to 2016 Hemoglobin 12.8 with mild macrocytosis  Lipid Panel Labs from September 2017: Cholesterol 147, LDL 58, HDL 66, triglycerides 117  ASSESSMENT:    1. Vasovagal syncope   2. Coronary artery disease involving native coronary artery of native heart with other form of angina pectoris ([place])   3. Essential hypertension   4. Hereditary hemochromatosis ([place])   5. Pure hypercholesterolemia      PLAN:  In order of problems listed  above:  1. Syncope: Suspected had orthostatic hypotension, but cannot entirely exclude arrhythmia. Event monitor was benign. If syncope recurs it suggested implantable loop recorder. 2. CAD: Angina free on beta blocker, without need for revascularization in about 8 years. Continue aspirin, beta blocker, statin. 3. HLP: Excellent lipids on current dose of atorvastatin. 4. HTN: Blood pressure is well controlled. Possibly had orthostatic hypotension. No change made medicines today. Advised to drink plenty of fluids and get up slowly, especially first in the morning. 5. Hemochromatosis: Control with phlebotomies, no signs of liver or cardiac end organ damage.   Medication Adjustments/Labs and Tests Ordered: Current medicines are reviewed at length with the  patient today.  Concerns regarding medicines are outlined above.  Medication changes, Labs and Tests ordered today are listed in the Patient Instructions below. Patient Instructions  Dr Sallyanne Kuster recommends that you schedule a follow-up appointment in 12 months. You will receive a reminder letter in the mail two months in advance. If you don't receive a letter, please call our office to schedule the follow-up appointment.  If you need a refill on your cardiac medications before your next appointment, please call your pharmacy.    Signed, Sanda Klein, MD  01/08/2017 8:14 AM    [facility] [place] [address], [place], [place]  [address] Phone: [phone]; Fax: [phone]

## 2017-01-07 NOTE — Patient Instructions (Signed)
Dr Croitoru recommends that you schedule a follow-up appointment in 12 months. You will receive a reminder letter in the mail two months in advance. If you don't receive a letter, please call our office to schedule the follow-up appointment.  If you need a refill on your cardiac medications before your next appointment, please call your pharmacy. 

## 2017-02-23 DIAGNOSIS — M25562 Pain in left knee: Secondary | ICD-10-CM | POA: Diagnosis not present

## 2017-02-26 DIAGNOSIS — M25562 Pain in left knee: Secondary | ICD-10-CM | POA: Diagnosis not present

## 2017-03-01 ENCOUNTER — Telehealth: Payer: Self-pay

## 2017-03-01 ENCOUNTER — Encounter: Payer: Self-pay | Admitting: Cardiovascular Disease

## 2017-03-01 NOTE — Telephone Encounter (Signed)
[email or web address] 

## 2017-03-01 NOTE — Telephone Encounter (Signed)
1. Type of surgery: Left knee scope  2. Date of surgery: pending 3. Surgeon: Dr Edmonia Lynch 4. Medications that need to be held & how long: Aspirin 81 mg 5. Fax and/or Phone: (p) [phone] (f) [phone]

## 2017-03-05 NOTE — Telephone Encounter (Addendum)
Faxed letter to [employer], surgical coordinator at [facility] via epic.

## 2017-03-11 DIAGNOSIS — M94262 Chondromalacia, left knee: Secondary | ICD-10-CM | POA: Diagnosis not present

## 2017-03-11 DIAGNOSIS — S83242A Other tear of medial meniscus, current injury, left knee, initial encounter: Secondary | ICD-10-CM | POA: Diagnosis not present

## 2017-03-11 DIAGNOSIS — Y999 Unspecified external cause status: Secondary | ICD-10-CM | POA: Diagnosis not present

## 2017-03-11 DIAGNOSIS — X58XXXA Exposure to other specified factors, initial encounter: Secondary | ICD-10-CM | POA: Diagnosis not present

## 2017-03-11 DIAGNOSIS — G8918 Other acute postprocedural pain: Secondary | ICD-10-CM | POA: Diagnosis not present

## 2017-03-22 DIAGNOSIS — S83242D Other tear of medial meniscus, current injury, left knee, subsequent encounter: Secondary | ICD-10-CM | POA: Diagnosis not present

## 2017-04-19 DIAGNOSIS — S83242D Other tear of medial meniscus, current injury, left knee, subsequent encounter: Secondary | ICD-10-CM | POA: Diagnosis not present

## 2017-04-20 DIAGNOSIS — R1310 Dysphagia, unspecified: Secondary | ICD-10-CM | POA: Diagnosis not present

## 2017-04-20 DIAGNOSIS — Z23 Encounter for immunization: Secondary | ICD-10-CM | POA: Diagnosis not present

## 2017-04-20 DIAGNOSIS — K219 Gastro-esophageal reflux disease without esophagitis: Secondary | ICD-10-CM | POA: Diagnosis not present

## 2017-04-29 DIAGNOSIS — H0289 Other specified disorders of eyelid: Secondary | ICD-10-CM | POA: Diagnosis not present

## 2017-04-29 DIAGNOSIS — Z961 Presence of intraocular lens: Secondary | ICD-10-CM | POA: Diagnosis not present

## 2017-04-29 DIAGNOSIS — L718 Other rosacea: Secondary | ICD-10-CM | POA: Diagnosis not present

## 2017-04-29 DIAGNOSIS — H02833 Dermatochalasis of right eye, unspecified eyelid: Secondary | ICD-10-CM | POA: Diagnosis not present

## 2017-05-25 DIAGNOSIS — R809 Proteinuria, unspecified: Secondary | ICD-10-CM | POA: Diagnosis not present

## 2017-05-25 DIAGNOSIS — E790 Hyperuricemia without signs of inflammatory arthritis and tophaceous disease: Secondary | ICD-10-CM | POA: Diagnosis not present

## 2017-05-25 DIAGNOSIS — R945 Abnormal results of liver function studies: Secondary | ICD-10-CM | POA: Diagnosis not present

## 2017-05-25 DIAGNOSIS — E7800 Pure hypercholesterolemia, unspecified: Secondary | ICD-10-CM | POA: Diagnosis not present

## 2017-05-25 DIAGNOSIS — N400 Enlarged prostate without lower urinary tract symptoms: Secondary | ICD-10-CM | POA: Diagnosis not present

## 2017-05-25 DIAGNOSIS — D649 Anemia, unspecified: Secondary | ICD-10-CM | POA: Diagnosis not present

## 2017-05-27 DIAGNOSIS — D649 Anemia, unspecified: Secondary | ICD-10-CM | POA: Diagnosis not present

## 2017-05-27 DIAGNOSIS — Z8589 Personal history of malignant neoplasm of other organs and systems: Secondary | ICD-10-CM | POA: Diagnosis not present

## 2017-05-27 DIAGNOSIS — R1310 Dysphagia, unspecified: Secondary | ICD-10-CM | POA: Diagnosis not present

## 2017-05-27 DIAGNOSIS — I2510 Atherosclerotic heart disease of native coronary artery without angina pectoris: Secondary | ICD-10-CM | POA: Diagnosis not present

## 2017-05-27 DIAGNOSIS — E7800 Pure hypercholesterolemia, unspecified: Secondary | ICD-10-CM | POA: Diagnosis not present

## 2017-05-27 DIAGNOSIS — Z0000 Encounter for general adult medical examination without abnormal findings: Secondary | ICD-10-CM | POA: Diagnosis not present

## 2017-05-27 DIAGNOSIS — D692 Other nonthrombocytopenic purpura: Secondary | ICD-10-CM | POA: Diagnosis not present

## 2017-05-27 DIAGNOSIS — I10 Essential (primary) hypertension: Secondary | ICD-10-CM | POA: Diagnosis not present

## 2017-05-27 DIAGNOSIS — R945 Abnormal results of liver function studies: Secondary | ICD-10-CM | POA: Diagnosis not present

## 2017-05-27 DIAGNOSIS — N400 Enlarged prostate without lower urinary tract symptoms: Secondary | ICD-10-CM | POA: Diagnosis not present

## 2017-05-27 DIAGNOSIS — R809 Proteinuria, unspecified: Secondary | ICD-10-CM | POA: Diagnosis not present

## 2017-05-27 DIAGNOSIS — M064 Inflammatory polyarthropathy: Secondary | ICD-10-CM | POA: Diagnosis not present

## 2017-06-04 DIAGNOSIS — L570 Actinic keratosis: Secondary | ICD-10-CM | POA: Diagnosis not present

## 2017-07-05 ENCOUNTER — Encounter: Payer: Self-pay | Admitting: Cardiovascular Disease

## 2017-07-05 ENCOUNTER — Ambulatory Visit: Payer: PPO | Admitting: Cardiovascular Disease

## 2017-07-05 VITALS — BP 126/64 | HR 74 | Ht 64.5 in | Wt 157.0 lb

## 2017-07-05 DIAGNOSIS — R55 Syncope and collapse: Secondary | ICD-10-CM

## 2017-07-05 DIAGNOSIS — I10 Essential (primary) hypertension: Secondary | ICD-10-CM

## 2017-07-05 DIAGNOSIS — I350 Nonrheumatic aortic (valve) stenosis: Secondary | ICD-10-CM | POA: Diagnosis not present

## 2017-07-05 DIAGNOSIS — E7800 Pure hypercholesterolemia, unspecified: Secondary | ICD-10-CM | POA: Diagnosis not present

## 2017-07-05 DIAGNOSIS — I25118 Atherosclerotic heart disease of native coronary artery with other forms of angina pectoris: Secondary | ICD-10-CM | POA: Diagnosis not present

## 2017-07-05 NOTE — Patient Instructions (Signed)
Dr Croitoru recommends that you schedule a follow-up appointment in 12 months. You will receive a reminder letter in the mail two months in advance. If you don't receive a letter, please call our office to schedule the follow-up appointment.  If you need a refill on your cardiac medications before your next appointment, please call your pharmacy. 

## 2017-07-05 NOTE — Progress Notes (Signed)
Cardiology Office Note    Date:  07/05/2017   ID:  Corey Garcia, Corey Garcia August 10, 1946, MRN [id number]  PCP:  [name], MD  Cardiologist:   Sanda Klein, MD   chief complaint: CAD follow up   History of Present Illness:  Corey Garcia is a 71 y.o. male with CAD, hypertension, hyperlipidemia and hemochromatosis, syncope presumably due to orthostatic hypotension, returns for follow-up.  He has not had any additional syncopal events.  The patient specifically denies any chest pain at rest or with exertion, dyspnea at rest or with exertion, orthopnea, paroxysmal nocturnal dyspnea, syncope, palpitations, focal neurological deficits, intermittent claudication, lower extremity edema, unexplained weight gain, cough, hemoptysis or wheezing.  The patient also denies abdominal pain, nausea, vomiting, dysphagia, diarrhea, constipation, polyuria, polydipsia, dysuria, hematuria, frequency, urgency, abnormal bleeding or bruising, fever, chills, unexpected weight changes, mood swings, change in skin or hair texture, change in voice quality, auditory or visual problems, allergic reactions or rashes, new musculoskeletal complaints other than usual "aches and pains".  He wakes up a couple of times every night to use the bathroom, even while taking the alpha-blocker.  In 2009 had a high-grade stenosis of the mid LAD artery as well as an ostial stenosis of the circumflex-OM and underwent two-vessel PCI presents with cutting balloon atherectomy of the OM 2 and 3.0 x 13 mm drug-eluting Cypher stent to the LAD artery. He also had a 50% right coronary artery stenosis. He has not had any coronary symptoms since then. In April of 2014 he had a low-risk nuclear stress test without perfusion defects with normal ejection fraction. His coronary risk factors are hypertension and hyperlipidemia both well addressed with pharmacological therapy.  he has a history of photosensitivity with lisinopril.  Past  Medical History:  Diagnosis Date  . Alcoholism ([place]) 03/15/2014  . Bradycardia by electrocardiogram    BB decreased 11/12/11  . CAD (coronary artery disease)    last cath=01/2008, PCI; last nuc=08/09/08, normal  . Dyslipidemia   . ED (erectile dysfunction)    viagra helps  . HTN (hypertension)   . Iron overload 03/15/2014  . 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 Murmur, cardiac    faint early systolic grade 1/6 aortic ejection murmur  . Rib pain 04/16/2014  . Skin cancer    basal cell ca X 4    Past Surgical History:  Procedure Laterality Date  . CORONARY ANGIOPLASTY WITH STENT PLACEMENT  01/31/2008   PCI with cutting balloon arthrectomy of the ostium of the circ obtuse marg 2 vessel  . CORONARY ANGIOPLASTY WITH STENT PLACEMENT  01/27/08   nl LV function, PTCA/stenting of LAD with 3x53mm Cypher post dilated 3.30mm  . HERNIA REPAIR    . NASAL SEPTUM SURGERY      Current Medications: Outpatient Medications Prior to Visit  Medication Sig Dispense Refill  . aspirin 81 MG tablet Take 81 mg by mouth daily.    Marland Kitchen atorvastatin (LIPITOR) 20 MG tablet Take 20 mg by mouth daily.    . bisoprolol (ZEBETA) 10 MG tablet Take 1.5 tablets by mouth daily.    . cholecalciferol (VITAMIN D) 1000 units tablet Take 1,000 Units by mouth daily.    . Clobetasol Prop Emollient Base (CLOBETASOL PROPIONATE E) 0.05 % emollient cream Apply 1 application topically daily as needed (dermatosis).     . hydrocortisone (PROCTO-MED HC) 2.5 % rectal cream Place 1 application rectally as needed for hemorrhoids or itching.    . loratadine (CLARITIN)  10 MG tablet Take 1 tablet by mouth daily.    . Multiple Vitamins-Minerals (ONE DAILY MULTIVITAMIN MEN PO) Take 1 tablet by mouth daily.     . Pantoprazole Sodium (PROTONIX PO) Take 1 tablet daily by mouth.    . tacrolimus (PROTOPIC) 0.03 % ointment Apply 1 application topically daily as needed (dermatitis on face).     . tamsulosin (FLOMAX) 0.4 MG CAPS capsule Take  0.4 mg by mouth daily.     No facility-administered medications prior to visit.      Allergies:   Codeine; Lisinopril; and Thiazide-type diuretics   Social History   Socioeconomic History  . Marital status: Married    Spouse name: None  . Number of children: None  . Years of education: None  . Highest education level: None  Social Needs  . Financial resource strain: None  . Food insecurity - worry: None  . Food insecurity - inability: None  . Transportation needs - medical: None  . Transportation needs - non-medical: None  Occupational History  . Occupation: Pharmacist, community  Tobacco Use  . Smoking status: Former Smoker    Last attempt to quit: 10/31/1982    Years since quitting: 34.7  . Smokeless tobacco: Never Used  Substance and Sexual Activity  . Alcohol use: Yes    Alcohol/week: 12.0 oz    Types: 20 Cans of beer per week  . Drug use: No  . Sexual activity: None  Other Topics Concern  . None  Social History Narrative  . None     Family History:  The patient's family history includes Heart failure in his brother, father, and mother.   ROS:   Please see the history of present illness.    Review of Systems  Skin: Itching: .mcrpexn.   All other systems reviewed and are negative.   PHYSICAL EXAM:   VS:  BP 126/64   Pulse 74   Ht 5' 4.5" (1.638 m)   Wt 157 lb (71.2 kg)   BMI 26.53 kg/m      General: Alert, oriented x3, no distress, mildly overweight Head: no evidence of trauma, PERRL, EOMI, no exophtalmos or lid lag, no myxedema, no xanthelasma; normal ears, nose and oropharynx Neck: normal jugular venous pulsations and no hepatojugular reflux; brisk carotid pulses without delay and no carotid bruits Chest: clear to auscultation, no signs of consolidation by percussion or palpation, normal fremitus, symmetrical and full respiratory excursions Cardiovascular: normal position and quality of the apical impulse, regular rhythm, normal first and second  heart sounds, early peaking 2/6 systolic ejection murmur in the aortic focus, no diastolic murmurs, rubs or gallops Abdomen: no tenderness or distention, no masses by palpation, no abnormal pulsatility or arterial bruits, normal bowel sounds, no hepatosplenomegaly Extremities: no clubbing, cyanosis or edema; 2+ radial, ulnar and brachial pulses bilaterally; 2+ right femoral, posterior tibial and dorsalis pedis pulses; 2+ left femoral, posterior tibial and dorsalis pedis pulses; no subclavian or femoral bruits Neurological: grossly nonfocal Psych: Normal mood and affect   Wt Readings from Last 3 Encounters:  07/05/17 157 lb (71.2 kg)  01/07/17 157 lb 6.4 oz (71.4 kg)  11/09/16 162 lb (73.5 kg)       Studies/Labs Reviewed:   EKG:  EKG is ordered today.  It shows normal sinus rhythm, QS pattern in leads V1-V2, otherwise normal tracing Recent Labs: Labs from September 2018: Creatinine 1.15, glucose 98, hemoglobin 13, normal liver function tests, potassium 4.4 Lipid Panel Labs from September 2018: Cholesterol  175, LDL 81, HDL of 89, triglycerides 27  ASSESSMENT:    1. Coronary artery disease involving native coronary artery of native heart with other form of angina pectoris ([employer])   2. Syncope, unspecified syncope type   3. Nonrheumatic aortic valve stenosis   4. Pure hypercholesterolemia   5. Essential hypertension   6. Hereditary hemochromatosis ([place])      PLAN:  In order of problems listed above:  1. CAD: Asymptomatic with beta-blocker is the only antianginal, without need for revascularization in about 9 years. Continue aspirin, beta blocker, statin. 2. Syncope: No recurrence.  Advised that it may happen again if he is dehydrated, jumps up too quickly, as long as he takes the alpha blocker and beta-blocker. 3. AS: Mild by echo in March 2018 4. HLP: Excellent lipids on current dose of atorvastatin. 5. HTN: Adequate control.  Consider reducing the beta-blocker dose if he has  recurrent orthostatic hypotension or syncope. 6. Hemochromatosis: Controlled with phlebotomy, no evidence of cardiac or hepatic endorgan involvement   Medication Adjustments/Labs and Tests Ordered: Current medicines are reviewed at length with the patient today.  Concerns regarding medicines are outlined above.  Medication changes, Labs and Tests ordered today are listed in the Patient Instructions below. Patient Instructions  Dr Sallyanne Kuster recommends that you schedule a follow-up appointment in 12 months. You will receive a reminder letter in the mail two months in advance. If you don't receive a letter, please call our office to schedule the follow-up appointment.  If you need a refill on your cardiac medications before your next appointment, please call your pharmacy.    Signed, Sanda Klein, MD  07/05/2017 1:57 PM    [facility] [address], [place], [place]  [address] Phone: [phone]; Fax: [phone]

## 2017-07-06 DIAGNOSIS — Z148 Genetic carrier of other disease: Secondary | ICD-10-CM | POA: Diagnosis not present

## 2017-07-06 DIAGNOSIS — R718 Other abnormality of red blood cells: Secondary | ICD-10-CM | POA: Diagnosis not present

## 2017-07-06 DIAGNOSIS — D649 Anemia, unspecified: Secondary | ICD-10-CM | POA: Diagnosis not present

## 2017-07-20 DIAGNOSIS — N5201 Erectile dysfunction due to arterial insufficiency: Secondary | ICD-10-CM | POA: Diagnosis not present

## 2017-07-20 DIAGNOSIS — N401 Enlarged prostate with lower urinary tract symptoms: Secondary | ICD-10-CM | POA: Diagnosis not present

## 2017-07-20 DIAGNOSIS — R3912 Poor urinary stream: Secondary | ICD-10-CM | POA: Diagnosis not present

## 2017-08-04 ENCOUNTER — Telehealth: Payer: Self-pay | Admitting: *Deleted

## 2017-08-04 ENCOUNTER — Telehealth: Payer: Self-pay | Admitting: Hematology and Oncology

## 2017-08-04 NOTE — Telephone Encounter (Signed)
Pt left a message stating he cannot come for 121/8 appt with Dr Alvy Bimler.   Msg to scheduler

## 2017-08-04 NOTE — Telephone Encounter (Signed)
Left message re 12/18 visit. Schedule mailed.

## 2017-08-05 ENCOUNTER — Telehealth: Payer: Self-pay | Admitting: Hematology and Oncology

## 2017-08-05 NOTE — Telephone Encounter (Signed)
Spoke with patient re appt. Transfer pateint to Nurse for questions re appt

## 2017-08-06 ENCOUNTER — Telehealth: Payer: Self-pay

## 2017-08-06 ENCOUNTER — Other Ambulatory Visit: Payer: Self-pay | Admitting: Physician Assistant

## 2017-08-06 DIAGNOSIS — L080 Pyoderma: Secondary | ICD-10-CM | POA: Diagnosis not present

## 2017-08-06 DIAGNOSIS — D492 Neoplasm of unspecified behavior of bone, soft tissue, and skin: Secondary | ICD-10-CM | POA: Diagnosis not present

## 2017-08-06 DIAGNOSIS — D044 Carcinoma in situ of skin of scalp and neck: Secondary | ICD-10-CM | POA: Diagnosis not present

## 2017-08-06 DIAGNOSIS — L570 Actinic keratosis: Secondary | ICD-10-CM | POA: Diagnosis not present

## 2017-08-06 DIAGNOSIS — L281 Prurigo nodularis: Secondary | ICD-10-CM | POA: Diagnosis not present

## 2017-08-06 NOTE — Telephone Encounter (Signed)
Patient left message regarding upcoming appointment. Why is he seeing Dr. Alvy Bimler. His last lab results was okay. He wants call back regarding.

## 2017-08-06 NOTE — Telephone Encounter (Signed)
Called and left message regarding earlier call. Per Dr. Alvy Bimler, she received a referral from patients primary MD regarding abnormal CBC. Ask patient to call nurse back.

## 2017-08-06 NOTE — Telephone Encounter (Signed)
Patient called and left message that he wants to keep appointment. He wants to make sure that his insurance is going to pay for his visit to the cancer center and what the cost will be. Someone needs to call his insurance company.

## 2017-08-10 NOTE — Telephone Encounter (Signed)
He is currently scheduled for 1/4. I do not know anything about insurance

## 2017-08-13 ENCOUNTER — Telehealth: Payer: Self-pay | Admitting: *Deleted

## 2017-08-13 NOTE — Telephone Encounter (Signed)
Notified that his office visit is covered by his insurance per our Development worker, community. He will have his specialist co pay. Verbalized understanding

## 2017-08-17 ENCOUNTER — Other Ambulatory Visit: Payer: PPO

## 2017-08-17 ENCOUNTER — Ambulatory Visit: Payer: PPO | Admitting: Hematology and Oncology

## 2017-08-21 ENCOUNTER — Telehealth: Payer: Self-pay | Admitting: Physician Assistant

## 2017-08-21 NOTE — Telephone Encounter (Signed)
Pt called because he has a cold with nasal and sinus congestion.  Wants to know if he can take Allegra D.  He found some old rx and took one this am, ok to continue this?  Recommended saline nasal spray and follow his BP carefully if he needs to take more of the Allegra D.  Pt agreeable to minimizing the Allegra D.  Rosaria Ferries, PA-C 08/21/2017 4:06 PM Beeper [phone]

## 2017-08-26 NOTE — Telephone Encounter (Signed)
Notes recorded by Diana Eves, CMA on 01/01/2017 at 2:05 PM EDT Patient returned call. Results reviewed.

## 2017-09-02 ENCOUNTER — Other Ambulatory Visit: Payer: Self-pay

## 2017-09-03 ENCOUNTER — Other Ambulatory Visit (HOSPITAL_BASED_OUTPATIENT_CLINIC_OR_DEPARTMENT_OTHER): Payer: PPO

## 2017-09-03 ENCOUNTER — Encounter: Payer: Self-pay | Admitting: Hematology and Oncology

## 2017-09-03 ENCOUNTER — Ambulatory Visit (HOSPITAL_BASED_OUTPATIENT_CLINIC_OR_DEPARTMENT_OTHER): Payer: PPO | Admitting: Hematology and Oncology

## 2017-09-03 ENCOUNTER — Telehealth: Payer: Self-pay | Admitting: Hematology and Oncology

## 2017-09-03 DIAGNOSIS — D539 Nutritional anemia, unspecified: Secondary | ICD-10-CM | POA: Insufficient documentation

## 2017-09-03 DIAGNOSIS — K709 Alcoholic liver disease, unspecified: Secondary | ICD-10-CM | POA: Diagnosis not present

## 2017-09-03 DIAGNOSIS — E349 Endocrine disorder, unspecified: Secondary | ICD-10-CM | POA: Insufficient documentation

## 2017-09-03 LAB — CBC WITH DIFFERENTIAL/PLATELET
BASO%: 0.2 % (ref 0.0–2.0)
BASOS ABS: 0 10*3/uL (ref 0.0–0.1)
EOS%: 2.4 % (ref 0.0–7.0)
Eosinophils Absolute: 0.1 10*3/uL (ref 0.0–0.5)
HEMATOCRIT: 36 % — AB (ref 38.4–49.9)
HGB: 11.9 g/dL — ABNORMAL LOW (ref 13.0–17.1)
LYMPH#: 1.4 10*3/uL (ref 0.9–3.3)
LYMPH%: 31 % (ref 14.0–49.0)
MCH: 34.1 pg — AB (ref 27.2–33.4)
MCHC: 33.1 g/dL (ref 32.0–36.0)
MCV: 103.2 fL — ABNORMAL HIGH (ref 79.3–98.0)
MONO#: 0.7 10*3/uL (ref 0.1–0.9)
MONO%: 14 % (ref 0.0–14.0)
NEUT#: 2.4 10*3/uL (ref 1.5–6.5)
NEUT%: 52.4 % (ref 39.0–75.0)
PLATELETS: 159 10*3/uL (ref 140–400)
RBC: 3.49 10*6/uL — ABNORMAL LOW (ref 4.20–5.82)
RDW: 11.7 % (ref 11.0–14.6)
WBC: 4.6 10*3/uL (ref 4.0–10.3)

## 2017-09-03 LAB — FERRITIN: Ferritin: 208 ng/ml (ref 22–316)

## 2017-09-03 NOTE — Telephone Encounter (Signed)
Gave patient AVs and calendar of upcoming January appointments.  °

## 2017-09-03 NOTE — Assessment & Plan Note (Signed)
The patient has intermittent macrocytic anemia He is concerned about other undiagnosed disease such as bone marrow disorder We discussed some of the common causes, testing involved including imaging study or bone marrow biopsy The patient would like to try alcohol cessation and repeat blood work in 2 weeks I will order additional workup in 2 weeks and will call the patient with test results.  He is in agreement with the plan

## 2017-09-03 NOTE — Progress Notes (Signed)
[facility] NOTE  [name], MD SUMMARY OF HEMATOLOGIC HISTORY:  The abnormal blood work was discovered during a routine visit with his primary care provider. I have the opportunity to review his blood work dated back for a while. His white blood cell count ranged from 3.1-5.6, hemoglobin from 11.3-12.6, MCV from 103-104.5, and ferritin as high as 910, along with abnormal liver function tests. He complains of fatigue, chronic headaches and chronic joint pain. The patient has history of basal cell carcinoma removed 4 times. He had been on topical steroid cream for eczema and complained of diffuse skin rash on his sun exposed area and face. He also has a significant easy bruising. The patient has been on antiplatelet agents along time for coronary artery disease. The patient denies any recent signs or symptoms of bleeding such as spontaneous epistaxis, hematuria or hematochezia. The patient was subsequently noted to be a carrier for hemachromatosis gene,  Heterozygous for C282Y Ultimately, I recommend observation only with serial blood work and phlebotomy  INTERVAL HISTORY:Corey Garcia 72 y.o. male returns for further follow-up. I have copies of his blood work from his primary care physician's office dated May 25, 2017. It took [place] a long time to get the patient back in because he is somewhat reluctant and is concerned that insurance will not pay for his visit Since last time I saw him, he donated blood for times. That has made drastic improvement of his ferritin level. However, he has abnormal CBC again on May 25, 2017 with associated macrocytosis. His primary care doctor told him to stop donating blood and was referred here for further evaluation In the meantime, he denies recent bleeding the patient has been drinking alcohol on a daily basis.  He has been drinking beer long-term. I have reviewed his medication list.  I have reviewed the  past medical history, past surgical history, social history and family history with the patient and they are unchanged from previous note.  ALLERGIES:  is allergic to codeine; lisinopril; and thiazide-type diuretics.  MEDICATIONS:  Current Outpatient Medications  Medication Sig Dispense Refill  . aspirin 81 MG tablet Take 81 mg by mouth daily.    Marland Kitchen atorvastatin (LIPITOR) 20 MG tablet Take 20 mg by mouth daily.    . bisoprolol (ZEBETA) 10 MG tablet Take 1.5 tablets by mouth daily.    . cholecalciferol (VITAMIN D) 1000 units tablet Take 1,000 Units by mouth daily.    . Clobetasol Prop Emollient Base (CLOBETASOL PROPIONATE E) 0.05 % emollient cream Apply 1 application topically daily as needed (dermatosis).     . hydrocortisone (PROCTO-MED HC) 2.5 % rectal cream Place 1 application rectally as needed for hemorrhoids or itching.    . loratadine (CLARITIN) 10 MG tablet Take 1 tablet by mouth daily.    . Multiple Vitamins-Minerals (ONE DAILY MULTIVITAMIN MEN PO) Take 1 tablet by mouth daily.     . Pantoprazole Sodium (PROTONIX PO) Take 1 tablet daily by mouth.    . tacrolimus (PROTOPIC) 0.03 % ointment Apply 1 application topically daily as needed (dermatitis on face).     . tamsulosin (FLOMAX) 0.4 MG CAPS capsule Take 0.4 mg by mouth daily.     No current facility-administered medications for this visit.      REVIEW OF SYSTEMS:   Constitutional: Denies fevers, chills or night sweats Eyes: Denies blurriness of vision Ears, nose, mouth, throat, and face: Denies mucositis or sore throat Respiratory: Denies cough, dyspnea or wheezes  Cardiovascular: Denies palpitation, chest discomfort or lower extremity swelling Gastrointestinal:  Denies nausea, heartburn or change in bowel habits Skin: Denies abnormal skin rashes Lymphatics: Denies new lymphadenopathy Neurological:Denies numbness, tingling or new weaknesses Behavioral/Psych: Mood is stable, no new changes  All other systems were reviewed  with the patient and are negative.  PHYSICAL EXAMINATION: ECOG PERFORMANCE STATUS: 0 - Asymptomatic  Vitals:   09/03/17 1301  BP: (!) 146/73  Pulse: 71  Resp: 19  Temp: 98.6 F (37 C)  SpO2: 100%   Filed Weights   09/03/17 1301  Weight: 155 lb 3.2 oz (70.4 kg)    GENERAL:alert, no distress and comfortable.  He is moderately obese SKIN: skin color, texture, turgor are normal, no rashes or significant lesions.  Noted mild skin bruising EYES: normal, Conjunctiva are pink and non-injected, sclera clear OROPHARYNX:no exudate, no erythema and lips, buccal mucosa, and tongue normal  NECK: supple, thyroid normal size, non-tender, without nodularity LYMPH:  no palpable lymphadenopathy in the cervical, axillary or inguinal LUNGS: clear to auscultation and percussion with normal breathing effort HEART: regular rate & rhythm and no murmurs and no lower extremity edema ABDOMEN:abdomen soft, non-tender and normal bowel sounds Musculoskeletal:no cyanosis of digits and no clubbing  NEURO: alert & oriented x 3 with fluent speech, no focal motor/sensory deficits  LABORATORY DATA:  I have reviewed the data as listed     Component Value Date/Time   NA 132 (L) 10/31/2016 0406   NA 138 06/20/2015 1303   K 3.7 10/31/2016 0406   K 4.6 06/20/2015 1303   CL 98 (L) 10/31/2016 0406   CO2 21 (L) 10/31/2016 0406   CO2 28 06/20/2015 1303   GLUCOSE 108 (H) 10/31/2016 0406   GLUCOSE 90 06/20/2015 1303   BUN 8 10/31/2016 0406   BUN 8.3 06/20/2015 1303   CREATININE 0.79 10/31/2016 0406   CREATININE 0.9 06/20/2015 1303   CALCIUM 8.4 (L) 10/31/2016 0406   CALCIUM 9.1 06/20/2015 1303   PROT 6.5 10/31/2016 0406   PROT 6.9 06/20/2015 1303   ALBUMIN 3.2 (L) 10/31/2016 0406   ALBUMIN 3.3 (L) 06/20/2015 1303   AST 97 (H) 10/31/2016 0406   AST 88 (H) 06/20/2015 1303   ALT 59 10/31/2016 0406   ALT 82 (H) 06/20/2015 1303   ALKPHOS 56 10/31/2016 0406   ALKPHOS 59 06/20/2015 1303   BILITOT 0.5  10/31/2016 0406   BILITOT 0.45 06/20/2015 1303   GFRNONAA >60 10/31/2016 0406   GFRAA >60 10/31/2016 0406    No results found for: SPEP, UPEP  Lab Results  Component Value Date   WBC 4.6 09/03/2017   NEUTROABS 2.4 09/03/2017   HGB 11.9 (L) 09/03/2017   HCT 36.0 (L) 09/03/2017   MCV 103.2 (H) 09/03/2017   PLT 159 09/03/2017      Chemistry      Component Value Date/Time   NA 132 (L) 10/31/2016 0406   NA 138 06/20/2015 1303   K 3.7 10/31/2016 0406   K 4.6 06/20/2015 1303   CL 98 (L) 10/31/2016 0406   CO2 21 (L) 10/31/2016 0406   CO2 28 06/20/2015 1303   BUN 8 10/31/2016 0406   BUN 8.3 06/20/2015 1303   CREATININE 0.79 10/31/2016 0406   CREATININE 0.9 06/20/2015 1303      Component Value Date/Time   CALCIUM 8.4 (L) 10/31/2016 0406   CALCIUM 9.1 06/20/2015 1303   ALKPHOS 56 10/31/2016 0406   ALKPHOS 59 06/20/2015 1303   AST 97 (H) 10/31/2016 0406  AST 88 (H) 06/20/2015 1303   ALT 59 10/31/2016 0406   ALT 82 (H) 06/20/2015 1303   BILITOT 0.5 10/31/2016 0406   BILITOT 0.45 06/20/2015 1303       ASSESSMENT & PLAN:  Hemochromatosis He has history of iron overload He is heterozygous for hemochromatosis gene The patient is somewhat reluctant to undergo phlebotomy His last phlebotomy was last summer His last iron study in September was elevated We discussed the risk and benefit of waiting and ultimately the patient do not want to proceed with phlebotomy until further testing.  Liver disease due to alcohol He has intermittent elevated liver enzymes which I suspect is due to alcoholism.  Iron overload, on the other hand, could also cause elevated liver enzymes. We discussed conservative management with staying abstinent from alcohol for 2 weeks and repeat blood work again If his liver enzymes and MCV remained despite abstinence from alcoholism, we might have to investigate further with imaging study of his liver  Macrocytic anemia The patient has intermittent  macrocytic anemia He is concerned about other undiagnosed disease such as bone marrow disorder We discussed some of the common causes, testing involved including imaging study or bone marrow biopsy The patient would like to try alcohol cessation and repeat blood work in 2 weeks I will order additional workup in 2 weeks and will call the patient with test results.  He is in agreement with the plan   Orders Placed This Encounter  Procedures  . Erythropoietin    Standing Status:   Future    Standing Expiration Date:   10/08/2018  . Folate RBC    Standing Status:   Future    Standing Expiration Date:   10/08/2018  . Sedimentation rate    Standing Status:   Future    Standing Expiration Date:   10/08/2018  . CBC & Diff and Retic    Standing Status:   Future    Standing Expiration Date:   10/08/2018  . Comprehensive metabolic panel    Standing Status:   Future    Standing Expiration Date:   10/08/2018  . TSH    Standing Status:   Future    Standing Expiration Date:   10/08/2018  . Testosterone    Standing Status:   Future    Standing Expiration Date:   10/08/2018    All questions were answered. The patient knows to call the clinic with any problems, questions or concerns. No barriers to learning was detected.  I spent150 minutes counseling the patient face to face. The total time spent in the appointment was 20 minutes and more than 50% was on counseling.     [name], MD 1/4/20191:39 PM

## 2017-09-03 NOTE — Assessment & Plan Note (Signed)
He has history of iron overload He is heterozygous for hemochromatosis gene The patient is somewhat reluctant to undergo phlebotomy His last phlebotomy was last summer His last iron study in September was elevated We discussed the risk and benefit of waiting and ultimately the patient do not want to proceed with phlebotomy until further testing.

## 2017-09-03 NOTE — Assessment & Plan Note (Signed)
He has intermittent elevated liver enzymes which I suspect is due to alcoholism.  Iron overload, on the other hand, could also cause elevated liver enzymes. We discussed conservative management with staying abstinent from alcohol for 2 weeks and repeat blood work again If his liver enzymes and MCV remained despite abstinence from alcoholism, we might have to investigate further with imaging study of his liver

## 2017-09-17 ENCOUNTER — Inpatient Hospital Stay: Payer: PPO | Attending: Hematology and Oncology

## 2017-09-17 DIAGNOSIS — D539 Nutritional anemia, unspecified: Secondary | ICD-10-CM | POA: Insufficient documentation

## 2017-09-17 DIAGNOSIS — E349 Endocrine disorder, unspecified: Secondary | ICD-10-CM

## 2017-09-17 LAB — CBC WITH DIFFERENTIAL (CANCER CENTER ONLY)
Basophils Absolute: 0 10*3/uL (ref 0.0–0.1)
Basophils Relative: 0 %
Eosinophils Absolute: 0.3 10*3/uL (ref 0.0–0.5)
Eosinophils Relative: 7 %
HEMATOCRIT: 35.9 % — AB (ref 38.4–49.9)
HEMOGLOBIN: 11.6 g/dL — AB (ref 13.0–17.1)
LYMPHS ABS: 1.5 10*3/uL (ref 0.9–3.3)
Lymphocytes Relative: 31 %
MCH: 33.8 pg — AB (ref 27.2–33.4)
MCHC: 32.3 g/dL (ref 32.0–36.0)
MCV: 104.7 fL — ABNORMAL HIGH (ref 79.3–98.0)
MONOS PCT: 12 %
Monocytes Absolute: 0.6 10*3/uL (ref 0.1–0.9)
NEUTROS ABS: 2.4 10*3/uL (ref 1.5–6.5)
NEUTROS PCT: 50 %
Platelet Count: 201 10*3/uL (ref 140–400)
RBC: 3.43 MIL/uL — ABNORMAL LOW (ref 4.20–5.82)
RDW: 12 % (ref 11.0–15.6)
WBC Count: 4.9 10*3/uL (ref 4.0–10.3)

## 2017-09-17 LAB — COMPREHENSIVE METABOLIC PANEL
ALBUMIN: 3.4 g/dL — AB (ref 3.5–5.0)
ALT: 29 U/L (ref 0–55)
AST: 33 U/L (ref 5–34)
Alkaline Phosphatase: 55 U/L (ref 40–150)
Anion gap: 8 (ref 3–11)
BUN: 11 mg/dL (ref 7–26)
CHLORIDE: 101 mmol/L (ref 98–109)
CO2: 28 mmol/L (ref 22–29)
Calcium: 9 mg/dL (ref 8.4–10.4)
Creatinine, Ser: 1.04 mg/dL (ref 0.70–1.30)
GFR calc Af Amer: >60 mL/min (ref >60)
GFR calc non Af Amer: >60 mL/min (ref >60)
GLUCOSE: 104 mg/dL (ref 70–140)
POTASSIUM: 4.2 mmol/L (ref 3.5–5.1)
Sodium: 137 mmol/L (ref 136–145)
Total Bilirubin: 0.3 mg/dL (ref 0.2–1.2)
Total Protein: 6.9 g/dL (ref 6.4–8.3)

## 2017-09-17 LAB — RETICULOCYTES
RBC.: 3.43 MIL/uL — ABNORMAL LOW (ref 4.20–5.82)
Retic Count, Absolute: 24 10*3/uL — ABNORMAL LOW (ref 34.8–93.9)
Retic Ct Pct: 0.7 % — ABNORMAL LOW (ref 0.8–1.8)

## 2017-09-17 LAB — SEDIMENTATION RATE: Sed Rate: 43 mm/hr — ABNORMAL HIGH (ref 0–16)

## 2017-09-17 LAB — TSH: TSH: 1.733 u[IU]/mL (ref 0.320–4.118)

## 2017-09-18 LAB — ERYTHROPOIETIN: ERYTHROPOIETIN: 12.6 m[IU]/mL (ref 2.6–18.5)

## 2017-09-18 LAB — TESTOSTERONE: Testosterone: 367 ng/dL (ref 264–916)

## 2017-09-20 ENCOUNTER — Telehealth: Payer: Self-pay | Admitting: *Deleted

## 2017-09-20 LAB — FOLATE RBC
FOLATE, RBC: 1331 ng/mL (ref >498)
Folate, Hemolysate: 453.9 ng/mL
HEMATOCRIT: 34.1 % — AB (ref 37.5–51.0)

## 2017-09-20 NOTE — Telephone Encounter (Signed)
I will send scheduling msg 

## 2017-09-20 NOTE — Telephone Encounter (Signed)
-----   Message from Hebert Soho, RN sent at 09/17/2017  3:33 PM EST ----- Regarding: FW: labs   ----- Message ----- From: [name], MD Sent: 09/17/2017   2:14 PM To: Hebert Soho, RN Subject: labs                                           Tell him:  All tests normal except he is still anemic and MCV is still high Per prior discussion, patient wants to wait and see what the test showed  Please ask him if he wants to be seen to discuss test or just talk to his PCP first ----- Message ----- From: Interface, Lab In [place] Sent: 09/17/2017   1:05 PM To: [name], MD

## 2017-09-20 NOTE — Telephone Encounter (Signed)
He is OK to schedule a return appt with Dr Alvy Bimler to review labs

## 2017-09-20 NOTE — Telephone Encounter (Signed)
Notified of message below. States he was off alcohol X 2 weeks. Is unsure of what to do.  Would he need phlebotomy?

## 2017-09-22 ENCOUNTER — Telehealth: Payer: Self-pay | Admitting: Hematology and Oncology

## 2017-09-22 NOTE — Telephone Encounter (Signed)
"  Returning a missed call from the office from [facility]."   Provided 09-28-2017 appointment information.  No further questions or needs at this time.

## 2017-09-22 NOTE — Telephone Encounter (Signed)
Left message for patient regarding upcoming January appointments per 1/21 sch message.

## 2017-09-28 ENCOUNTER — Inpatient Hospital Stay (HOSPITAL_BASED_OUTPATIENT_CLINIC_OR_DEPARTMENT_OTHER): Payer: PPO | Admitting: Hematology and Oncology

## 2017-09-28 ENCOUNTER — Encounter: Payer: Self-pay | Admitting: Hematology and Oncology

## 2017-09-28 DIAGNOSIS — D539 Nutritional anemia, unspecified: Secondary | ICD-10-CM | POA: Diagnosis not present

## 2017-09-28 NOTE — Assessment & Plan Note (Signed)
He has persistent macrocytic anemia of unknown etiology I have sent off a lot of tests I have rule out nutritional deficiency, liver disease and others I am concerned about undiagnosed myelodysplastic syndrome I recommend bone marrow aspirate and biopsy for further evaluation He disclosed to me that his brother was diagnosed with lymphoma He has no signs and symptoms to suggest lymphoma We also discussed possibility of ordering imaging study to rule out occult malignancy Ultimately, the patient is in agreement to pursue bone marrow aspirate and biopsy in the future He had numerous appointments including skin surgeries for recurrent squamous cell carcinoma of his face this week He will call me once he is ready to schedule bone marrow aspirate and biopsy At this point in time, he is not symptomatic from anemia and I think we can afford to wait a month or 2 until he is ready

## 2017-09-28 NOTE — Progress Notes (Signed)
[place] NOTE  Corey Ruff, MD SUMMARY OF HEMATOLOGIC HISTORY:  The abnormal blood work was discovered during a routine visit with his primary care provider. I have the opportunity to review his blood work dated back for a while. His white blood cell count ranged from 3.1-5.6, hemoglobin from 11.3-12.6, MCV from 103-104.5, and ferritin as high as 910, along with abnormal liver function tests. He complains of fatigue, chronic headaches and chronic joint pain. The patient has history of basal cell carcinoma removed 4 times. He had been on topical steroid cream for eczema and complained of diffuse skin rash on his sun exposed area and face. He also has a significant easy bruising. The patient has been on antiplatelet agents along time for coronary artery disease. The patient denies any recent signs or symptoms of bleeding such as spontaneous epistaxis, hematuria or hematochezia. 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 male returns for for further follow-up to discuss abnormal test results He was found to have elevated sedimentation rate along with macrocytic anemia.  Serum erythropoietin level was inappropriately low for the degree of anemia He stay abstinent from alcohol intake prior to blood test and his liver enzymes were within normal limits He is not symptomatic from anemia He is concerned about other upcoming appointments especially in regards to skin surgeries for recurrent squamous cell carcinoma of his face His appetite is stable, no recent weight loss The patient denies any recent signs or symptoms of bleeding such as spontaneous epistaxis, hematuria or hematochezia.   I have reviewed the past medical history, past surgical history, social history and family  history with the patient and they are unchanged from previous note.  ALLERGIES:  is allergic to codeine; lisinopril; and thiazide-type diuretics.  MEDICATIONS:  Current Outpatient Medications  Medication Sig Dispense Refill  . aspirin 81 MG tablet Take 81 mg by mouth daily.    Marland Kitchen atorvastatin (LIPITOR) 20 MG tablet Take 20 mg by mouth daily.    . bisoprolol (ZEBETA) 10 MG tablet Take 1.5 tablets by mouth daily.    . cholecalciferol (VITAMIN D) 1000 units tablet Take 1,000 Units by mouth daily.    . Clobetasol Prop Emollient Base (CLOBETASOL PROPIONATE E) 0.05 % emollient cream Apply 1 application topically daily as needed (dermatosis).     . hydrocortisone (PROCTO-MED HC) 2.5 % rectal cream Place 1 application rectally as needed for hemorrhoids or itching.    . loratadine (CLARITIN) 10 MG tablet Take 1 tablet by mouth daily.    . Multiple Vitamins-Minerals (ONE DAILY MULTIVITAMIN MEN PO) Take 1 tablet by mouth daily.     . Pantoprazole Sodium (PROTONIX PO) Take 1 tablet daily by mouth.    . tacrolimus (PROTOPIC) 0.03 % ointment Apply 1 application topically daily as needed (dermatitis on face).     . tamsulosin (FLOMAX) 0.4 MG CAPS capsule Take 0.4 mg by mouth daily.     No current facility-administered medications for this visit.      REVIEW OF SYSTEMS:   Constitutional: Denies fevers, chills or night sweats Eyes: Denies blurriness of vision Ears, nose, mouth, throat, and face: Denies mucositis or sore throat Respiratory: Denies cough, dyspnea or wheezes Cardiovascular: Denies palpitation, chest discomfort or lower extremity swelling Gastrointestinal:  Denies nausea, heartburn or change in bowel habits Skin: Denies abnormal skin  rashes Lymphatics: Denies new lymphadenopathy or easy bruising Neurological:Denies numbness, tingling or new weaknesses Behavioral/Psych: Mood is stable, no new changes  All other systems were reviewed with the patient and are negative.  PHYSICAL  EXAMINATION: ECOG PERFORMANCE STATUS: 0 - Asymptomatic  Vitals:   09/28/17 1326  BP: (!) 159/70  Pulse: 68  Resp: 18  Temp: 98.5 F (36.9 C)  SpO2: 100%   Filed Weights   09/28/17 1326  Weight: 157 lb 14.4 oz (71.6 kg)    GENERAL:alert, no distress and comfortable NEURO: alert & oriented x 3 with fluent speech, no focal motor/sensory deficits  LABORATORY DATA:  I have reviewed the data as listed     Component Value Date/Time   NA 137 09/17/2017 1245   NA 138 06/20/2015 1303   K 4.2 09/17/2017 1245   K 4.6 06/20/2015 1303   CL 101 09/17/2017 1245   CO2 28 09/17/2017 1245   CO2 28 06/20/2015 1303   GLUCOSE 104 09/17/2017 1245   GLUCOSE 90 06/20/2015 1303   BUN 11 09/17/2017 1245   BUN 8.3 06/20/2015 1303   CREATININE 1.04 09/17/2017 1245   CREATININE 0.9 06/20/2015 1303   CALCIUM 9.0 09/17/2017 1245   CALCIUM 9.1 06/20/2015 1303   PROT 6.9 09/17/2017 1245   PROT 6.9 06/20/2015 1303   ALBUMIN 3.4 (L) 09/17/2017 1245   ALBUMIN 3.3 (L) 06/20/2015 1303   AST 33 09/17/2017 1245   AST 88 (H) 06/20/2015 1303   ALT 29 09/17/2017 1245   ALT 82 (H) 06/20/2015 1303   ALKPHOS 55 09/17/2017 1245   ALKPHOS 59 06/20/2015 1303   BILITOT 0.3 09/17/2017 1245   BILITOT 0.45 06/20/2015 1303   GFRNONAA >60 09/17/2017 1245   GFRAA >60 09/17/2017 1245    No results found for: SPEP, UPEP  Lab Results  Component Value Date   WBC 4.9 09/17/2017   NEUTROABS 2.4 09/17/2017   HGB 11.9 (L) 09/03/2017   HCT 34.1 (L) 09/17/2017   MCV 104.7 (H) 09/17/2017   PLT 201 09/17/2017      Chemistry      Component Value Date/Time   NA 137 09/17/2017 1245   NA 138 06/20/2015 1303   K 4.2 09/17/2017 1245   K 4.6 06/20/2015 1303   CL 101 09/17/2017 1245   CO2 28 09/17/2017 1245   CO2 28 06/20/2015 1303   BUN 11 09/17/2017 1245   BUN 8.3 06/20/2015 1303   CREATININE 1.04 09/17/2017 1245   CREATININE 0.9 06/20/2015 1303      Component Value Date/Time   CALCIUM 9.0 09/17/2017 1245    CALCIUM 9.1 06/20/2015 1303   ALKPHOS 55 09/17/2017 1245   ALKPHOS 59 06/20/2015 1303   AST 33 09/17/2017 1245   AST 88 (H) 06/20/2015 1303   ALT 29 09/17/2017 1245   ALT 82 (H) 06/20/2015 1303   BILITOT 0.3 09/17/2017 1245   BILITOT 0.45 06/20/2015 1303      ASSESSMENT & PLAN:  Macrocytic anemia He has persistent macrocytic anemia of unknown etiology I have sent off a lot of tests I have rule out nutritional deficiency, liver disease and others I am concerned about undiagnosed myelodysplastic syndrome I recommend bone marrow aspirate and biopsy for further evaluation He disclosed to me that his brother was diagnosed with lymphoma He has no signs and symptoms to suggest lymphoma We also discussed possibility of ordering imaging study to rule out occult malignancy Ultimately, the patient is in agreement to pursue bone marrow aspirate and  biopsy in the future He had numerous appointments including skin surgeries for recurrent squamous cell carcinoma of his face this week He will call me once he is ready to schedule bone marrow aspirate and biopsy At this point in time, he is not symptomatic from anemia and I think we can afford to wait a month or 2 until he is ready   No orders of the defined types were placed in this encounter.   All questions were answered. The patient knows to call the clinic with any problems, questions or concerns. No barriers to learning was detected.  I spent 15 minutes counseling the patient face to face. The total time spent in the appointment was 20 minutes and more than 50% was on counseling.     [name], MD 1/29/20193:37 PM

## 2017-09-29 ENCOUNTER — Telehealth: Payer: Self-pay | Admitting: Hematology and Oncology

## 2017-09-29 DIAGNOSIS — Z961 Presence of intraocular lens: Secondary | ICD-10-CM | POA: Diagnosis not present

## 2017-09-29 DIAGNOSIS — H52223 Regular astigmatism, bilateral: Secondary | ICD-10-CM | POA: Diagnosis not present

## 2017-09-29 DIAGNOSIS — H524 Presbyopia: Secondary | ICD-10-CM | POA: Diagnosis not present

## 2017-09-29 NOTE — Telephone Encounter (Signed)
No new orders or visits per 1/29 los.

## 2017-09-30 DIAGNOSIS — L570 Actinic keratosis: Secondary | ICD-10-CM | POA: Diagnosis not present

## 2017-10-07 ENCOUNTER — Other Ambulatory Visit: Payer: Self-pay | Admitting: Hematology and Oncology

## 2017-10-07 DIAGNOSIS — D539 Nutritional anemia, unspecified: Secondary | ICD-10-CM

## 2017-10-12 ENCOUNTER — Inpatient Hospital Stay: Payer: PPO | Attending: Hematology and Oncology | Admitting: Hematology and Oncology

## 2017-10-12 ENCOUNTER — Inpatient Hospital Stay: Payer: PPO

## 2017-10-12 VITALS — BP 163/67 | HR 65 | Temp 98.4°F | Resp 18

## 2017-10-12 DIAGNOSIS — D539 Nutritional anemia, unspecified: Secondary | ICD-10-CM | POA: Diagnosis not present

## 2017-10-12 DIAGNOSIS — K709 Alcoholic liver disease, unspecified: Secondary | ICD-10-CM | POA: Insufficient documentation

## 2017-10-12 DIAGNOSIS — D7589 Other specified diseases of blood and blood-forming organs: Secondary | ICD-10-CM | POA: Diagnosis not present

## 2017-10-12 LAB — COMPREHENSIVE METABOLIC PANEL
ALK PHOS: 61 U/L (ref 40–150)
ALT: 21 U/L (ref 0–55)
AST: 37 U/L — AB (ref 5–34)
Albumin: 3.9 g/dL (ref 3.5–5.0)
Anion gap: 9 (ref 3–11)
BILIRUBIN TOTAL: 0.7 mg/dL (ref 0.2–1.2)
BUN: 11 mg/dL (ref 7–26)
CO2: 28 mmol/L (ref 22–29)
CREATININE: 1.05 mg/dL (ref 0.70–1.30)
Calcium: 9.2 mg/dL (ref 8.4–10.4)
Chloride: 99 mmol/L (ref 98–109)
GFR calc Af Amer: >60 mL/min (ref >60)
Glucose, Bld: 91 mg/dL (ref 70–140)
Potassium: 4.1 mmol/L (ref 3.5–5.1)
Sodium: 136 mmol/L (ref 136–145)
TOTAL PROTEIN: 7.4 g/dL (ref 6.4–8.3)

## 2017-10-12 LAB — CBC WITH DIFFERENTIAL/PLATELET
Basophils Absolute: 0 10*3/uL (ref 0.0–0.1)
Basophils Relative: 1 %
EOS PCT: 6 %
Eosinophils Absolute: 0.2 10*3/uL (ref 0.0–0.5)
HEMATOCRIT: 36.8 % — AB (ref 38.4–49.9)
Hemoglobin: 12.4 g/dL — ABNORMAL LOW (ref 13.0–17.1)
LYMPHS PCT: 32 %
Lymphs Abs: 1.1 10*3/uL (ref 0.9–3.3)
MCH: 34 pg — ABNORMAL HIGH (ref 27.2–33.4)
MCHC: 33.7 g/dL (ref 32.0–36.0)
MCV: 100.9 fL — AB (ref 79.3–98.0)
MONO ABS: 0.5 10*3/uL (ref 0.1–0.9)
MONOS PCT: 15 %
NEUTROS ABS: 1.6 10*3/uL (ref 1.5–6.5)
Neutrophils Relative %: 46 %
PLATELETS: 203 10*3/uL (ref 140–400)
RBC: 3.65 MIL/uL — ABNORMAL LOW (ref 4.20–5.82)
RDW: 12.3 % (ref 11.0–14.6)
WBC: 3.3 10*3/uL — ABNORMAL LOW (ref 4.0–10.3)

## 2017-10-12 LAB — LACTATE DEHYDROGENASE: LDH: 205 U/L (ref 125–245)

## 2017-10-12 MED ORDER — LIDOCAINE HCL 2 % IJ SOLN
INTRAMUSCULAR | Status: AC
  Filled 2017-10-12: qty 20

## 2017-10-12 NOTE — Patient Instructions (Signed)
Bone Marrow Aspiration and Bone Marrow Biopsy, Adult, Care After This sheet gives you information about how to care for yourself after your procedure. Your health care provider may also give you more specific instructions. If you have problems or questions, contact your health care provider. What can I expect after the procedure? After the procedure, it is common to have:  Mild pain and tenderness.  Swelling.  Bruising.  Follow these instructions at home:  Take over-the-counter or prescription medicines only as told by your health care provider.  Do not take baths, swim, or use a hot tub until your health care provider approves. Ask if you can take a shower or have a sponge bath.  Follow instructions from your health care provider about how to take care of the puncture site. Make sure you: ? Wash your hands with soap and water before you change your bandage (dressing). If soap and water are not available, use hand sanitizer. ? Change your dressing as told by your health care provider.  Check your puncture siteevery day for signs of infection. Check for: ? More redness, swelling, or pain. ? More fluid or blood. ? Warmth. ? Pus or a bad smell.  Return to your normal activities as told by your health care provider. Ask your health care provider what activities are safe for you.  Do not drive for 24 hours if you were given a medicine to help you relax (sedative).  Keep all follow-up visits as told by your health care provider. This is important. Contact a health care provider if:  You have more redness, swelling, or pain around the puncture site.  You have more fluid or blood coming from the puncture site.  Your puncture site feels warm to the touch.  You have pus or a bad smell coming from the puncture site.  You have a fever.  Your pain is not controlled with medicine. This information is not intended to replace advice given to you by your health care provider. Make sure  you discuss any questions you have with your health care provider. Document Released: 03/06/2005 Document Revised: 03/06/2016 Document Reviewed: 01/29/2016 Elsevier Interactive Patient Education  2018 [employer].

## 2017-10-12 NOTE — Progress Notes (Signed)
Bone Marrow Biopsy preformed by Dr. Alvy Bimler and assisted by this RN. Consent obtained from patient and time out performed.  Patient tolerated procedure well and monitored 30 minutes post.  Patient educated by Dr. Alvy Bimler and this RN regarding home care of procedure site.

## 2017-10-12 NOTE — Progress Notes (Signed)
Bone Marrow Biopsy and Aspiration Procedure Note   Informed consent was obtained and potential risks including bleeding, infection and pain were reviewed with the patient. The patient's name, date of birth, identification, consent and allergies were verified prior to the start of procedure and time out was performed.  The right posterior iliac crest was chosen as the site of biopsy.  The skin was prepped with Betadine solution.   8 cc of 2% lidocaine was used to provide local anaesthesia.   10 cc of bone marrow aspirate was obtained followed by 1 inch biopsy.   The procedure was tolerated well and there were no complications.  The patient was stable at the end of the procedure.  Specimens sent for flow cytometry, cytogenetics and additional studies.  

## 2017-10-13 LAB — KAPPA/LAMBDA LIGHT CHAINS
KAPPA, LAMDA LIGHT CHAIN RATIO: 0.93 (ref 0.26–1.65)
Kappa free light chain: 31.7 mg/L — ABNORMAL HIGH (ref 3.3–19.4)
Lambda free light chains: 34 mg/L — ABNORMAL HIGH (ref 5.7–26.3)

## 2017-10-15 LAB — MULTIPLE MYELOMA PANEL, SERUM
ALBUMIN/GLOB SERPL: 1.2 (ref 0.7–1.7)
ALPHA2 GLOB SERPL ELPH-MCNC: 0.9 g/dL (ref 0.4–1.0)
Albumin SerPl Elph-Mcnc: 3.7 g/dL (ref 2.9–4.4)
Alpha 1: 0.2 g/dL (ref 0.0–0.4)
B-Globulin SerPl Elph-Mcnc: 1.2 g/dL (ref 0.7–1.3)
GAMMA GLOB SERPL ELPH-MCNC: 1 g/dL (ref 0.4–1.8)
GLOBULIN, TOTAL: 3.3 g/dL (ref 2.2–3.9)
IGG (IMMUNOGLOBIN G), SERUM: 1177 mg/dL (ref 700–1600)
IGM (IMMUNOGLOBULIN M), SRM: 66 mg/dL (ref 20–172)
IgA: 424 mg/dL (ref 61–437)
Total Protein ELP: 7 g/dL (ref 6.0–8.5)

## 2017-10-25 ENCOUNTER — Ambulatory Visit: Payer: PPO | Admitting: Hematology and Oncology

## 2017-10-29 ENCOUNTER — Encounter: Payer: Self-pay | Admitting: Hematology and Oncology

## 2017-10-29 ENCOUNTER — Inpatient Hospital Stay: Payer: PPO | Attending: Hematology and Oncology | Admitting: Hematology and Oncology

## 2017-10-29 DIAGNOSIS — D61818 Other pancytopenia: Secondary | ICD-10-CM | POA: Insufficient documentation

## 2017-10-29 DIAGNOSIS — I714 Abdominal aortic aneurysm, without rupture: Secondary | ICD-10-CM | POA: Diagnosis not present

## 2017-10-29 DIAGNOSIS — K709 Alcoholic liver disease, unspecified: Secondary | ICD-10-CM | POA: Insufficient documentation

## 2017-10-29 DIAGNOSIS — D539 Nutritional anemia, unspecified: Secondary | ICD-10-CM | POA: Diagnosis present

## 2017-10-29 NOTE — Assessment & Plan Note (Signed)
Recent iron stores from bone marrow biopsy showed that it is mildly decreased He does not need phlebotomy

## 2017-10-29 NOTE — Assessment & Plan Note (Signed)
Bone marrow biopsy was negative for malignancy I suspect it could be acquired pancytopenia due to alcoholic liver disease and splenomegaly I recommend further imaging with either ultrasound or CT scan to evaluate Alternatively, I have recommended the patient to stop drinking and plan to recheck his blood count again in 6 months He would like to go home and think about the role of imaging study If I do not hear back from him, I will see him back in 6 months

## 2017-10-29 NOTE — Progress Notes (Signed)
[place] NOTE  Corey Ruff, MD SUMMARY OF HEMATOLOGIC HISTORY:  The abnormal blood work was discovered during a routine visit with his primary care provider. I have the opportunity to review his blood work dated back for a while. His white blood cell count ranged from 3.1-5.6, hemoglobin from 11.3-12.6, MCV from 103-104.5, and ferritin as high as 910, along with abnormal liver function tests. He complains of fatigue, chronic headaches and chronic joint pain. The patient has history of basal cell carcinoma removed 4 times. He had been on topical steroid cream for eczema and complained of diffuse skin rash on his sun exposed area and face. He also has a significant easy bruising. The patient has been on antiplatelet agents along time for coronary artery disease. The patient denies any recent signs or symptoms of bleeding such as spontaneous epistaxis, hematuria or hematochezia. 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  It was performed to evaluate for macrocytic anemia with mild pancytopenia  INTERVAL HISTORY: Corey Garcia 72 y.o. male returns for further follow-up.  He denies recent infection.  He tolerated recent bone marrow biopsy well.  I have reviewed the past medical history, past surgical history, social history and family history with the patient and they are unchanged from previous note.  ALLERGIES:  is allergic to codeine; lisinopril; and thiazide-type diuretics.  MEDICATIONS:  Current Outpatient Medications  Medication Sig Dispense Refill  . aspirin 81 MG tablet Take 81 mg by mouth daily.    Marland Kitchen atorvastatin (LIPITOR) 20 MG tablet Take 20 mg by mouth daily.    . bisoprolol (ZEBETA) 10 MG tablet Take 1.5 tablets by mouth daily.    . cholecalciferol  (VITAMIN D) 1000 units tablet Take 1,000 Units by mouth daily.    . Clobetasol Prop Emollient Base (CLOBETASOL PROPIONATE E) 0.05 % emollient cream Apply 1 application topically daily as needed (dermatosis).     . hydrocortisone (PROCTO-MED HC) 2.5 % rectal cream Place 1 application rectally as needed for hemorrhoids or itching.    . loratadine (CLARITIN) 10 MG tablet Take 1 tablet by mouth daily.    . Multiple Vitamins-Minerals (ONE DAILY MULTIVITAMIN MEN PO) Take 1 tablet by mouth daily.     . Pantoprazole Sodium (PROTONIX PO) Take 1 tablet daily by mouth.    . tacrolimus (PROTOPIC) 0.03 % ointment Apply 1 application topically daily as needed (dermatitis on face).     . tamsulosin (FLOMAX) 0.4 MG CAPS capsule Take 0.4 mg by mouth daily.     No current facility-administered medications for this visit.      REVIEW OF SYSTEMS:   Constitutional: Denies fevers, chills or night sweats Eyes: Denies blurriness of vision Ears, nose, mouth, throat, and face: Denies mucositis or sore throat Respiratory: Denies cough, dyspnea or wheezes Cardiovascular: Denies palpitation, chest discomfort or lower extremity swelling Gastrointestinal:  Denies nausea, heartburn or change in bowel habits Skin: Denies abnormal skin rashes Lymphatics: Denies new lymphadenopathy or easy bruising Neurological:Denies numbness, tingling or new weaknesses Behavioral/Psych: Mood is stable, no new changes  All other systems were reviewed with the patient and are negative.  PHYSICAL EXAMINATION: ECOG PERFORMANCE STATUS: 0 - Asymptomatic  Vitals:   10/29/17 1414  BP: (!) 155/67  Pulse: 74  Resp: 18  Temp: 98.2 F (36.8 C)  SpO2: 99%  Filed Weights   10/29/17 1414  Weight: 157 lb 6.4 oz (71.4 kg)    GENERAL:alert, no distress and comfortable NEURO: alert & oriented x 3 with fluent speech, no focal motor/sensory deficits  LABORATORY DATA:  I have reviewed the data as listed     Component Value Date/Time    NA 136 10/12/2017 0852   NA 138 06/20/2015 1303   K 4.1 10/12/2017 0852   K 4.6 06/20/2015 1303   CL 99 10/12/2017 0852   CO2 28 10/12/2017 0852   CO2 28 06/20/2015 1303   GLUCOSE 91 10/12/2017 0852   GLUCOSE 90 06/20/2015 1303   BUN 11 10/12/2017 0852   BUN 8.3 06/20/2015 1303   CREATININE 1.05 10/12/2017 0852   CREATININE 0.9 06/20/2015 1303   CALCIUM 9.2 10/12/2017 0852   CALCIUM 9.1 06/20/2015 1303   PROT 7.4 10/12/2017 0852   PROT 6.9 06/20/2015 1303   ALBUMIN 3.9 10/12/2017 0852   ALBUMIN 3.3 (L) 06/20/2015 1303   AST 37 (H) 10/12/2017 0852   AST 88 (H) 06/20/2015 1303   ALT 21 10/12/2017 0852   ALT 82 (H) 06/20/2015 1303   ALKPHOS 61 10/12/2017 0852   ALKPHOS 59 06/20/2015 1303   BILITOT 0.7 10/12/2017 0852   BILITOT 0.45 06/20/2015 1303   GFRNONAA >60 10/12/2017 0852   GFRAA >60 10/12/2017 0852    No results found for: SPEP, UPEP  Lab Results  Component Value Date   WBC 3.3 (L) 10/12/2017   NEUTROABS 1.6 10/12/2017   HGB 12.4 (L) 10/12/2017   HCT 36.8 (L) 10/12/2017   MCV 100.9 (H) 10/12/2017   PLT 203 10/12/2017      Chemistry      Component Value Date/Time   NA 136 10/12/2017 0852   NA 138 06/20/2015 1303   K 4.1 10/12/2017 0852   K 4.6 06/20/2015 1303   CL 99 10/12/2017 0852   CO2 28 10/12/2017 0852   CO2 28 06/20/2015 1303   BUN 11 10/12/2017 0852   BUN 8.3 06/20/2015 1303   CREATININE 1.05 10/12/2017 0852   CREATININE 0.9 06/20/2015 1303      Component Value Date/Time   CALCIUM 9.2 10/12/2017 0852   CALCIUM 9.1 06/20/2015 1303   ALKPHOS 61 10/12/2017 0852   ALKPHOS 59 06/20/2015 1303   AST 37 (H) 10/12/2017 0852   AST 88 (H) 06/20/2015 1303   ALT 21 10/12/2017 0852   ALT 82 (H) 06/20/2015 1303   BILITOT 0.7 10/12/2017 0852   BILITOT 0.45 06/20/2015 1303      ASSESSMENT & PLAN:  Pancytopenia, acquired ([place]) Bone marrow biopsy was negative for malignancy I suspect it could be acquired pancytopenia due to alcoholic liver disease and  splenomegaly I recommend further imaging with either ultrasound or CT scan to evaluate Alternatively, I have recommended the patient to stop drinking and plan to recheck his blood count again in 6 months He would like to go home and think about the role of imaging study If I do not hear back from him, I will see him back in 6 months  Hemochromatosis Recent iron stores from bone marrow biopsy showed that it is mildly decreased He does not need phlebotomy   No orders of the defined types were placed in this encounter.   All questions were answered. The patient knows to call the clinic with any problems, questions or concerns. No barriers to learning was detected.  I spent 10 minutes counseling the patient face to face. The total  time spent in the appointment was 15 minutes and more than 50% was on counseling.     [name], MD 3/1/20192:33 PM

## 2017-11-01 ENCOUNTER — Other Ambulatory Visit: Payer: Self-pay | Admitting: Hematology and Oncology

## 2017-11-01 ENCOUNTER — Telehealth: Payer: Self-pay | Admitting: Hematology and Oncology

## 2017-11-01 DIAGNOSIS — R945 Abnormal results of liver function studies: Secondary | ICD-10-CM

## 2017-11-01 DIAGNOSIS — F1020 Alcohol dependence, uncomplicated: Secondary | ICD-10-CM

## 2017-11-01 DIAGNOSIS — D61818 Other pancytopenia: Secondary | ICD-10-CM

## 2017-11-01 DIAGNOSIS — K760 Fatty (change of) liver, not elsewhere classified: Secondary | ICD-10-CM

## 2017-11-01 DIAGNOSIS — R748 Abnormal levels of other serum enzymes: Secondary | ICD-10-CM

## 2017-11-01 NOTE — Telephone Encounter (Signed)
Mailed patient calendar of upcoming September appointments.  °

## 2017-11-03 ENCOUNTER — Other Ambulatory Visit: Payer: Self-pay | Admitting: Hematology and Oncology

## 2017-11-04 DIAGNOSIS — L570 Actinic keratosis: Secondary | ICD-10-CM | POA: Diagnosis not present

## 2017-11-05 ENCOUNTER — Encounter (HOSPITAL_COMMUNITY): Payer: Self-pay | Admitting: Hematology and Oncology

## 2017-11-05 ENCOUNTER — Telehealth: Payer: Self-pay | Admitting: Hematology and Oncology

## 2017-11-05 LAB — CHROMOSOME ANALYSIS, BONE MARROW

## 2017-11-05 LAB — TISSUE HYBRIDIZATION (BONE MARROW)-NCBH

## 2017-11-05 NOTE — Telephone Encounter (Signed)
Spoke to patient regarding upcoming march appointments per 3/8 sch message

## 2017-11-08 ENCOUNTER — Ambulatory Visit (HOSPITAL_COMMUNITY)
Admission: RE | Admit: 2017-11-08 | Discharge: 2017-11-08 | Disposition: A | Discharge status: Home / Self Care | Payer: PPO | Source: Ambulatory Visit | Attending: Hematology and Oncology | Admitting: Hematology and Oncology

## 2017-11-08 ENCOUNTER — Encounter (HOSPITAL_COMMUNITY): Payer: Self-pay

## 2017-11-08 DIAGNOSIS — K760 Fatty (change of) liver, not elsewhere classified: Secondary | ICD-10-CM | POA: Diagnosis not present

## 2017-11-08 DIAGNOSIS — R932 Abnormal findings on diagnostic imaging of liver and biliary tract: Secondary | ICD-10-CM | POA: Insufficient documentation

## 2017-11-08 DIAGNOSIS — R935 Abnormal findings on diagnostic imaging of other abdominal regions, including retroperitoneum: Secondary | ICD-10-CM | POA: Insufficient documentation

## 2017-11-08 DIAGNOSIS — R7989 Other specified abnormal findings of blood chemistry: Secondary | ICD-10-CM | POA: Diagnosis not present

## 2017-11-08 DIAGNOSIS — R945 Abnormal results of liver function studies: Secondary | ICD-10-CM | POA: Insufficient documentation

## 2017-11-08 MED ORDER — IOPAMIDOL (ISOVUE-300) INJECTION 61%
INTRAVENOUS | Status: AC
  Filled 2017-11-08: qty 100

## 2017-11-08 MED ORDER — SODIUM CHLORIDE 0.9 % IJ SOLN
INTRAMUSCULAR | Status: AC
  Filled 2017-11-08: qty 50

## 2017-11-08 MED ORDER — IOPAMIDOL (ISOVUE-300) INJECTION 61%
100.0000 mL | Freq: Once | INTRAVENOUS | Status: AC | PRN
  Administered 2017-11-08: 100 mL via INTRAVENOUS

## 2017-11-09 ENCOUNTER — Encounter: Payer: Self-pay | Admitting: Hematology and Oncology

## 2017-11-09 ENCOUNTER — Inpatient Hospital Stay (HOSPITAL_BASED_OUTPATIENT_CLINIC_OR_DEPARTMENT_OTHER): Payer: PPO | Admitting: Hematology and Oncology

## 2017-11-09 DIAGNOSIS — I714 Abdominal aortic aneurysm, without rupture, unspecified: Secondary | ICD-10-CM | POA: Insufficient documentation

## 2017-11-09 DIAGNOSIS — K709 Alcoholic liver disease, unspecified: Secondary | ICD-10-CM

## 2017-11-09 DIAGNOSIS — D61818 Other pancytopenia: Secondary | ICD-10-CM | POA: Diagnosis not present

## 2017-11-09 NOTE — Assessment & Plan Note (Signed)
He has occasional intermittent pancytopenia I suspect they were related to alcoholism Recommend observation only

## 2017-11-09 NOTE — Assessment & Plan Note (Signed)
Recent iron stores from bone marrow biopsy showed that it is mildly decreased He does not need phlebotomy We discussed future follow-up He would like to follow with her primary care doctor for iron studies monitoring I recommend repeat phlebotomy if ferritin level is greater than 500

## 2017-11-09 NOTE — Progress Notes (Signed)
[place] OFFICE PROGRESS NOTE  [name], MD  ASSESSMENT & PLAN:  Hemochromatosis Recent iron stores from bone marrow biopsy showed that it is mildly decreased He does not need phlebotomy We discussed future follow-up He would like to follow with her primary care doctor for iron studies monitoring I recommend repeat phlebotomy if ferritin level is greater than 500  Liver disease due to alcohol We have reviewed recent CT imaging which show early signs of liver cirrhosis The cause is unknown but presumably related to alcoholism We discussed extensively about future follow-up and possible referral to see a liver specialist The patient will discuss further with his primary care doctor before making that decision In the meantime, I recommend he stay abstinent from alcoholism  Pancytopenia, acquired [facility]) He has occasional intermittent pancytopenia I suspect they were related to alcoholism Recommend observation only  Abdominal aneurysm ([place]) I have reviewed CT imaging with the patient extensively CT scan showed evidence of aneurysm of the aorta He has cardiovascular risk factors I recommend referral to see vascular surgery for further management and he agreed to proceed   Orders Placed This Encounter  Procedures  . Ambulatory referral to Vascular Surgery    Referral Priority:   Routine    Referral Type:   Surgical    Referral Reason:   Specialty Services Required    Requested Specialty:   Vascular Surgery    Number of Visits Requested:   1    INTERVAL HISTORY: Corey Garcia 72 y.o. male returns for further follow-up and review of CT imaging He feels well.  He has no recent infection.  SUMMARY OF HEMATOLOGIC HISTORY:  The abnormal blood work was discovered during a routine visit with his primary care provider. I have the opportunity to review his blood work dated back for a while. His white blood cell count ranged from 3.1-5.6, hemoglobin from  11.3-12.6, MCV from 103-104.5, and ferritin as high as 910, along with abnormal liver function tests. He complains of fatigue, chronic headaches and chronic joint pain. The patient has history of basal cell carcinoma removed 4 times. He had been on topical steroid cream for eczema and complained of diffuse skin rash on his sun exposed area and face. He also has a significant easy bruising. The patient has been on antiplatelet agents along time for coronary artery disease. The patient denies any recent signs or symptoms of bleeding such as spontaneous epistaxis, hematuria or hematochezia. 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  It was performed to evaluate for macrocytic anemia with mild pancytopenia In May 2019, he had CT imaging which show early signs of liver cirrhosis.  I have reviewed the past medical history, past surgical history, social history and family history with the patient and they are unchanged from previous note.  ALLERGIES:  is allergic to codeine; lisinopril; and thiazide-type diuretics.  MEDICATIONS:  Current Outpatient Medications  Medication Sig Dispense Refill  . aspirin 81 MG tablet Take 81 mg by mouth daily.    Marland Kitchen atorvastatin (LIPITOR) 20 MG tablet Take 20 mg by mouth daily.    . bisoprolol (ZEBETA) 10 MG tablet Take 1.5 tablets by mouth daily.    . cholecalciferol (VITAMIN D) 1000 units tablet Take 1,000 Units by mouth daily.    . Clobetasol Prop Emollient Base (CLOBETASOL PROPIONATE E) 0.05 %  emollient cream Apply 1 application topically daily as needed (dermatosis).     . hydrocortisone (PROCTO-MED HC) 2.5 % rectal cream Place 1 application rectally as needed for hemorrhoids or itching.    . loratadine (CLARITIN) 10 MG tablet Take 1 tablet by mouth daily.    . Multiple  Vitamins-Minerals (ONE DAILY MULTIVITAMIN MEN PO) Take 1 tablet by mouth daily.     . Pantoprazole Sodium (PROTONIX PO) Take 1 tablet daily by mouth.    . tacrolimus (PROTOPIC) 0.03 % ointment Apply 1 application topically daily as needed (dermatitis on face).     . tamsulosin (FLOMAX) 0.4 MG CAPS capsule Take 0.4 mg by mouth daily.     No current facility-administered medications for this visit.      REVIEW OF SYSTEMS:   Constitutional: Denies fevers, chills or night sweats Eyes: Denies blurriness of vision Ears, nose, mouth, throat, and face: Denies mucositis or sore throat Respiratory: Denies cough, dyspnea or wheezes Cardiovascular: Denies palpitation, chest discomfort or lower extremity swelling Gastrointestinal:  Denies nausea, heartburn or change in bowel habits Skin: Denies abnormal skin rashes Lymphatics: Denies new lymphadenopathy or easy bruising Neurological:Denies numbness, tingling or new weaknesses Behavioral/Psych: Mood is stable, no new changes  All other systems were reviewed with the patient and are negative.  PHYSICAL EXAMINATION: ECOG PERFORMANCE STATUS: 0 - Asymptomatic  Vitals:   11/09/17 1252  BP: (!) 146/67  Pulse: 73  Resp: 18  Temp: 98.1 F (36.7 C)  SpO2: 100%   Filed Weights   11/09/17 1252  Weight: 158 lb 12.8 oz (72 kg)    GENERAL:alert, no distress and comfortable SKIN: skin color, texture, turgor are normal, no rashes or significant lesions EYES: normal, Conjunctiva are pink and non-injected, sclera clear Musculoskeletal:no cyanosis of digits and no clubbing  NEURO: alert & oriented x 3 with fluent speech, no focal motor/sensory deficits  LABORATORY DATA:  I have reviewed the data as listed     Component Value Date/Time   NA 136 10/12/2017 0852   NA 138 06/20/2015 1303   K 4.1 10/12/2017 0852   K 4.6 06/20/2015 1303   CL 99 10/12/2017 0852   CO2 28 10/12/2017 0852   CO2 28 06/20/2015 1303   GLUCOSE 91 10/12/2017 0852   GLUCOSE  90 06/20/2015 1303   BUN 11 10/12/2017 0852   BUN 8.3 06/20/2015 1303   CREATININE 1.05 10/12/2017 0852   CREATININE 0.9 06/20/2015 1303   CALCIUM 9.2 10/12/2017 0852   CALCIUM 9.1 06/20/2015 1303   PROT 7.4 10/12/2017 0852   PROT 6.9 06/20/2015 1303   ALBUMIN 3.9 10/12/2017 0852   ALBUMIN 3.3 (L) 06/20/2015 1303   AST 37 (H) 10/12/2017 0852   AST 88 (H) 06/20/2015 1303   ALT 21 10/12/2017 0852   ALT 82 (H) 06/20/2015 1303   ALKPHOS 61 10/12/2017 0852   ALKPHOS 59 06/20/2015 1303   BILITOT 0.7 10/12/2017 0852   BILITOT 0.45 06/20/2015 1303   GFRNONAA >60 10/12/2017 0852   GFRAA >60 10/12/2017 0852    No results found for: SPEP, UPEP  Lab Results  Component Value Date   WBC 3.3 (L) 10/12/2017   NEUTROABS 1.6 10/12/2017   HGB 12.4 (L) 10/12/2017   HCT 36.8 (L) 10/12/2017   MCV 100.9 (H) 10/12/2017   PLT 203 10/12/2017      Chemistry      Component Value Date/Time   NA 136 10/12/2017 0852   NA 138 06/20/2015 1303   K 4.1 10/12/2017  0852   K 4.6 06/20/2015 1303   CL 99 10/12/2017 0852   CO2 28 10/12/2017 0852   CO2 28 06/20/2015 1303   BUN 11 10/12/2017 0852   BUN 8.3 06/20/2015 1303   CREATININE 1.05 10/12/2017 0852   CREATININE 0.9 06/20/2015 1303      Component Value Date/Time   CALCIUM 9.2 10/12/2017 0852   CALCIUM 9.1 06/20/2015 1303   ALKPHOS 61 10/12/2017 0852   ALKPHOS 59 06/20/2015 1303   AST 37 (H) 10/12/2017 0852   AST 88 (H) 06/20/2015 1303   ALT 21 10/12/2017 0852   ALT 82 (H) 06/20/2015 1303   BILITOT 0.7 10/12/2017 0852   BILITOT 0.45 06/20/2015 1303       RADIOGRAPHIC STUDIES: I have reviewed imaging study with the patient extensively I have personally reviewed the radiological images as listed and agreed with the findings in the report. Ct Abdomen W Contrast  Result Date: 11/08/2017 CLINICAL DATA:  Abnormal LFTs.  Pancytopenia EXAM: CT ABDOMEN WITH CONTRAST TECHNIQUE: Multidetector CT imaging of the abdomen was performed using the  standard protocol following bolus administration of intravenous contrast. CONTRAST:  149m ISOVUE-300 IOPAMIDOL (ISOVUE-300) INJECTION 61% COMPARISON:  None. FINDINGS: Lower chest:  Lung bases are clear. Hepatobiliary: Smooth contour of the liver. Mildly prominent caudate lobe. Mild atrophy of the RIGHT hepatic lobe lobe. No enhancing hepatic lesion. Gallbladder normal. Common bile duct normal Pancreas: Normal pancreatic parenchymal intensity. No ductal dilatation or inflammation. Spleen: Normal spleen. Adrenals/urinary tract: Adrenal glands and kidneys are normal. Stomach/Bowel: Stomach and limited of the small bowel is unremarkable Vascular/Lymphatic: Calcification of the abdominal aorta. Penetrating ulcer/saccular aneurysm along the anterior RIGHT aspect of the abdominal aorta measuring 12 x 13 mm (image 42, series 2). No abdominal lymphadenopathy Musculoskeletal: No aggressive osseous lesion IMPRESSION: 1. Early morphologic changes in liver could indicate cirrhosis. 2. No acute findings in the abdomen pelvis. 3. Normal spleen. 4. Small penetrating ulcer versus saccular aneurysm along the abdominal aorta. Electronically Signed   By: SSuzy BouchardM.D.   On: 11/08/2017 14:30    I spent 15 minutes counseling the patient face to face. The total time spent in the appointment was 20 minutes and more than 50% was on counseling.   All questions were answered. The patient knows to call the clinic with any problems, questions or concerns. No barriers to learning was detected.    NHeath Lark MD 3/12/20192:49 PM

## 2017-11-09 NOTE — Assessment & Plan Note (Signed)
We have reviewed recent CT imaging which show early signs of liver cirrhosis The cause is unknown but presumably related to alcoholism We discussed extensively about future follow-up and possible referral to see a liver specialist The patient will discuss further with his primary care doctor before making that decision In the meantime, I recommend he stay abstinent from alcoholism

## 2017-11-09 NOTE — Assessment & Plan Note (Signed)
I have reviewed CT imaging with the patient extensively CT scan showed evidence of aneurysm of the aorta He has cardiovascular risk factors I recommend referral to see vascular surgery for further management and he agreed to proceed

## 2017-11-18 ENCOUNTER — Ambulatory Visit: Payer: PPO | Admitting: Hematology and Oncology

## 2017-11-19 DIAGNOSIS — M1712 Unilateral primary osteoarthritis, left knee: Secondary | ICD-10-CM | POA: Diagnosis not present

## 2017-12-08 ENCOUNTER — Encounter: Payer: Self-pay | Admitting: Vascular Surgery

## 2017-12-08 ENCOUNTER — Telehealth: Payer: Self-pay | Admitting: Cardiovascular Disease

## 2017-12-08 ENCOUNTER — Ambulatory Visit: Payer: PPO | Admitting: Vascular Surgery

## 2017-12-08 ENCOUNTER — Other Ambulatory Visit: Payer: Self-pay

## 2017-12-08 VITALS — BP 174/81 | HR 63 | Temp 98.0°F | Resp 20 | Ht 64.5 in | Wt 154.8 lb

## 2017-12-08 DIAGNOSIS — I713 Abdominal aortic aneurysm, ruptured, unspecified: Secondary | ICD-10-CM

## 2017-12-08 DIAGNOSIS — I714 Abdominal aortic aneurysm, without rupture, unspecified: Secondary | ICD-10-CM

## 2017-12-08 DIAGNOSIS — Z01812 Encounter for preprocedural laboratory examination: Secondary | ICD-10-CM

## 2017-12-08 NOTE — Progress Notes (Signed)
Patient name: Corey Garcia MRN: [id number] DOB: June 30, 1946 Sex: male   REASON FOR CONSULT:    Abdominal aortic aneurysm.  The consult is requested by Dr. Alvy Bimler.  HPI:   Corey Garcia is a pleasant 72 y.o. male, who has a history of alcohol use and possible cirrhosis.  He had elevated liver function tests which prompted a CT of the abdomen which was done with contrast.  An incidental finding was a saccular abdominal aortic aneurysm and the patient was sent for vascular consultation.  I reviewed the note from the referring office on 11/09/2017.  The patient has a history of hemochromatosis.  He does not need lobotomy.  The patient also has a history of liver disease secondary to alcohol.  His CT scan shows early signs of liver cirrhosis.  The patient has a history of intermittent pancytopenia.  This is also felt to potentially be related to alcoholism.   Patient denies any abdominal pain or back pain.  He does have a history of coronary artery disease and is followed by Dr. Sallyanne Kuster.  He had a heart attack in 2009.  He underwent PTCA at that time.  He also has a history of some aortic stenosis.  He denies any recent chest pain or chest pressure.  He denies significant shortness of breath with exertion.  He denies orthopnea.  He denies any history of claudication or rest pain.  Past Medical History:  Diagnosis Date  . Alcoholism ([place]) 03/15/2014  . Anemia   . Bradycardia by electrocardiogram    BB decreased 11/12/11  . CAD (coronary artery disease)    last cath=01/2008, PCI; last nuc=08/09/08, normal  . CHF (congestive heart failure) ([place])   . Dyslipidemia   . ED (erectile dysfunction)    viagra helps  . HTN (hypertension)   . Iron overload 03/15/2014  . Liver disease due to alcohol ([place]) 03/15/2014  . Murmur, cardiac    faint early systolic grade 1/6 aortic ejection murmur  . Myocardial infarction ([place])   . Peripheral vascular disease ([place])   . Rib pain 04/16/2014  . Skin  cancer    basal cell ca X 4    Family History  Problem Relation Age of Onset  . Heart failure Mother        heart disease  . Heart disease Mother   . Heart failure Father        heart disease  . Heart disease Father   . Heart disease Brother   . Heart failure Brother        "bad heart valve"    SOCIAL HISTORY: Social History   Socioeconomic History  . Marital status: Married    Spouse name: Not on file  . Number of children: Not on file  . Years of education: Not on file  . Highest education level: Not on file  Occupational History  . Occupation: Pharmacist, community  Social Needs  . Financial resource strain: Not on file  . Food insecurity:    Worry: Not on file    Inability: Not on file  . Transportation needs:    Medical: Not on file    Non-medical: Not on file  Tobacco Use  . Smoking status: Former Smoker    Last attempt to quit: 10/31/1982    Years since quitting: 35.1  . Smokeless tobacco: Never Used  Substance and Sexual Activity  . Alcohol use: Yes    Alcohol/week: 12.0 oz    Types: 20  Cans of beer per week  . Drug use: No  . Sexual activity: Not on file  Lifestyle  . Physical activity:    Days per week: Not on file    Minutes per session: Not on file  . Stress: Not on file  Relationships  . Social connections:    Talks on phone: Not on file    Gets together: Not on file    Attends religious service: Not on file    Active member of club or organization: Not on file    Attends meetings of clubs or organizations: Not on file    Relationship status: Not on file  . Intimate partner violence:    Fear of current or ex partner: Not on file    Emotionally abused: Not on file    Physically abused: Not on file    Forced sexual activity: Not on file  Other Topics Concern  . Not on file  Social History Narrative  . Not on file    Allergies  Allergen Reactions  . Codeine Nausea Only  . Lisinopril     PHOTO SENSITIVITY ON ARMS  . Thiazide-Type  Diuretics Other (See Comments)    "Lowered my blood pressure"    Current Outpatient Medications  Medication Sig Dispense Refill  . aspirin 81 MG tablet Take 81 mg by mouth daily.    Marland Kitchen atorvastatin (LIPITOR) 20 MG tablet Take 20 mg by mouth daily.    . bisoprolol (ZEBETA) 10 MG tablet Take 1.5 tablets by mouth daily.    . cholecalciferol (VITAMIN D) 1000 units tablet Take 1,000 Units by mouth daily.    . Clobetasol Prop Emollient Base (CLOBETASOL PROPIONATE E) 0.05 % emollient cream Apply 1 application topically daily as needed (dermatosis).     . hydrocortisone (PROCTO-MED HC) 2.5 % rectal cream Place 1 application rectally as needed for hemorrhoids or itching.    . loratadine (CLARITIN) 10 MG tablet Take 1 tablet by mouth daily.    . Multiple Vitamins-Minerals (ONE DAILY MULTIVITAMIN MEN PO) Take 1 tablet by mouth daily.     . Pantoprazole Sodium (PROTONIX PO) Take 1 tablet daily by mouth.    . tacrolimus (PROTOPIC) 0.03 % ointment Apply 1 application topically daily as needed (dermatitis on face).     . tamsulosin (FLOMAX) 0.4 MG CAPS capsule Take 0.4 mg by mouth daily.     No current facility-administered medications for this visit.     REVIEW OF SYSTEMS:  [X]  denotes positive finding, [ ]  denotes negative finding Cardiac  Comments:  Chest pain or chest pressure:    Shortness of breath upon exertion:    Short of breath when lying flat:    Irregular heart rhythm:        Vascular    Pain in calf, thigh, or hip brought on by ambulation:    Pain in feet at night that wakes you up from your sleep:     Blood clot in your veins:    Leg swelling:         Pulmonary    Oxygen at home:    Productive cough:     Wheezing:         Neurologic    Sudden weakness in arms or legs:     Sudden numbness in arms or legs:     Sudden onset of difficulty speaking or slurred speech:    Temporary loss of vision in one eye:     Problems with dizziness:  Gastrointestinal    Blood in  stool:     Vomited blood:         Genitourinary    Burning when urinating:     Blood in urine:        Psychiatric    Major depression:         Hematologic    Bleeding problems:    Problems with blood clotting too easily:        Skin    Rashes or ulcers:        Constitutional    Fever or chills:     PHYSICAL EXAM:   Vitals:   12/08/17 0859 12/08/17 0901  BP: (!) 175/79 (!) 174/81  Pulse: 63   Resp: 20   Temp: 98 F (36.7 C)   TempSrc: Oral   SpO2: 98%   Weight: 154 lb 12.8 oz (70.2 kg)   Height: 5' 4.5" (1.638 m)     GENERAL: The patient is a well-nourished male, in no acute distress. The vital signs are documented above. CARDIAC: There is a regular rate and rhythm.  VASCULAR: I do not detect carotid bruits. On the right side he has a palpable femoral, popliteal, and dorsalis pedis pulse. On the left side he has a palpable femoral and popliteal pulse.  I cannot palpate pedal pulses. Both feet are warm and well-perfused.  He has no significant lower extremity swelling. PULMONARY: There is good air exchange bilaterally without wheezing or rales. ABDOMEN: Soft and non-tender with normal pitched bowel sounds.  I cannot palpate his aneurysm. MUSCULOSKELETAL: There are no major deformities or cyanosis. NEUROLOGIC: No focal weakness or paresthesias are detected. SKIN: There are no ulcers or rashes noted. PSYCHIATRIC: The patient has a normal affect.  DATA:    LABS: I reviewed his labs from February of this year.  Creatinine was 1.05.  GFR was greater than 60.  His white blood cell count was 3.3.  Hemoglobin 12.4.  Platelets 203,000.  His coags were normal.  CT ABDOMEN WITH CONTRAST: I have reviewed the CT abdomen with contrast that was done on 11/08/2017.  This was done to workup for abnormal LFTs and pancytopenia.  An incidental finding on the scan was a saccular infrarenal abdominal aortic aneurysm.  I measured the aorta above the saccular aneurysm at 20 mm.  The distal  aorta measures 17 mm.  There is a 30 mm neck proximally and a 25 mm neck distally.  The maximum diameter of the aneurysm is 3.2 cm.  MEDICAL ISSUES:   SACCULAR INFRARENAL ABDOMINAL AORTIC ANEURYSM: This patient has a 3.2 cm saccular infrarenal abdominal aortic aneurysm.  Given that this aneurysm is saccular and at high risk for rupture I would recommend consideration for elective repair.  Fortunately I think we could potentially repair this with an endovascular graft.  I will schedule him for CT angiogram of the abdomen and pelvis to evaluate him for an endovascular approach.  He does have some calcium in his aorta but I do not think this is a contraindication.  We would certainly need preoperative cardiac clearance given his history.  In addition will need clearance from his hematologist given his history of possible cirrhosis.  The last labs I saw showed normal coags however.  HYPERTENSION: The patient's initial blood pressure today was elevated. We repeated this and this was still elevated. We have encouraged the patient to follow up with their primary care physician for management of their blood pressure.   Deitra Mayo [facility] [phone]

## 2017-12-08 NOTE — Telephone Encounter (Signed)
   [facility] Pre-operative Risk Assessment    Request for surgical clearance:  1. What type of surgery is being performed? EVAR for AAA   2. When is this surgery scheduled? TBD   3. What type of clearance is required (medical clearance vs. Pharmacy clearance to hold med vs. Both)? Medical/cardiac   4. Are there any medications that need to be held prior to surgery and how long? None specified    5. Practice name and name of physician performing surgery? Dr. Deitra Mayo @ [facility]    6. What is your office phone number [phone])    7.   What is your office fax number [phone])  8.   Anesthesia type (None, local, MAC, general) ? Not specified   Surgery scheduling nurse = Olive Bass M 12/08/2017, 3:31 PM  _________________________________________________________________   (provider comments below)

## 2017-12-08 NOTE — Progress Notes (Signed)
Thanks for the update, Gerald Stabs. If he has EVAR, I do not think he needs any additional cardiac workup. I would recommend a Lexiscan Myoview preop if you think he is at high risk for needing open repair. Briya Lookabaugh

## 2017-12-09 NOTE — Telephone Encounter (Signed)
This patient follows with Dr. Sallyanne Kuster. Yesterday at approximately 10:00AM, Dr. Sallyanne Kuster recommended no further cardiac testing if this patient underwent an EVAR. However, lesixcan myoview would be needed if open repair was a possibility. Later in the afternoon, a preoperative clearance was sent to the preop pool.   I do not know the risk of an open procedure at this time. I think the safest course of action would be to obtain a lexiscan myoview for preoperative evaluation.   I will forward this message to the callback pool as well as to Dr. Sallyanne Kuster.  Once myoview is read, please send recommendations either to the preop pool or directly to Dr. Scot Dock.

## 2017-12-09 NOTE — Telephone Encounter (Signed)
When attempting to schedule the patient for a myoview, the patient revealed that he will have a CT scan on 12/20/17 for better visualization. This CT scan will further guide Dr. Scot Dock in terms of which approach will be used (EVAR vs open). The patient does not want to schedule a myoview until Dr. Scot Dock reviews his CT scan, in the event that an EVAR is highly likely.   The patient has an appt with Jory Sims on 12/29/17. He will be assessed at that visit - myoview can be ordered at that time if deemed appropriate.   As per Dr. Victorino December note on 12/08/17: "If he has EVAR, I do not think he needs any additional cardiac workup. I would recommend a Lexiscan Myoview preop if you think he is at high risk for needing open repair."  K. Lawrence, if no myoview needed, please send cardiac clearance to Dr. Nicole Cella office. If myoview needed, please schedule.

## 2017-12-09 NOTE — Telephone Encounter (Signed)
Spoke with pt re: ordering Lexiscan before he could be cleared for his upcoming surgery. Pt had several questions re: his surgery and why he would need a Lexiscan.  I transferred pt back to Doreene Adas, PA-C to answer his questions.

## 2017-12-10 ENCOUNTER — Encounter: Payer: Self-pay | Admitting: Vascular Surgery

## 2017-12-11 NOTE — Telephone Encounter (Signed)
Ok. I don't know when he is scheduled to be seen again in the office or if he will see Crenshaw or me.  Will keep this in mind.

## 2017-12-20 ENCOUNTER — Ambulatory Visit
Admission: RE | Admit: 2017-12-20 | Discharge: 2017-12-20 | Disposition: A | Discharge status: Home / Self Care | Payer: PPO | Source: Ambulatory Visit | Attending: Vascular Surgery | Admitting: Vascular Surgery

## 2017-12-20 DIAGNOSIS — I714 Abdominal aortic aneurysm, without rupture: Secondary | ICD-10-CM | POA: Diagnosis not present

## 2017-12-20 DIAGNOSIS — I713 Abdominal aortic aneurysm, ruptured, unspecified: Secondary | ICD-10-CM

## 2017-12-20 MED ORDER — IOPAMIDOL (ISOVUE-370) INJECTION 76%
75.0000 mL | Freq: Once | INTRAVENOUS | Status: AC | PRN
  Administered 2017-12-20: 75 mL via INTRAVENOUS

## 2017-12-27 NOTE — Progress Notes (Signed)
Cardiology Office Note   Date:  12/29/2017   ID:  Corey, Garcia 12-10-1945, MRN [id number]  PCP:  Corey Ruff, MD  Cardiologist:  Dr.Croitoru   Chief Complaint  Patient presents with  . Follow-up  . Pre-op Exam  . Coronary Artery Disease     History of Present Illness: Corey Garcia is a 72 y.o. male who presents for pre-op evaluation for EVAR by Dr. Scot Garcia. Surgery date is not planned yet.   He also has a history of elevated LFT's due to ETOH abuse and possible cirrhosis. CT scan of the abdomen revealed a saccular AAA at that time.   He also has a history of CAD with PTCA of high-grade stenosis of the mid LAD and ostial circumflex, he underwent two-vessel CI cutting balloon atherectomy of the OM@ and DES to the LAD. He had 50% RCA stenosis. He had a low risk NM study April of 2014; HTN, and Hyperlipidemia.   A second CT of the abdomen and pelvis was completed on 12/20/2017 revealing a small  infrarenal abdominal aorta with a maximal transverse diameter of 3.2. No suggestion of rupture or leaking.   He denies chest pain or dyspnea, dizziness or fatigue.   Past Medical History:  Diagnosis Date  . Alcoholism ([place]) 03/15/2014  . Anemia   . Bradycardia by electrocardiogram    BB decreased 11/12/11  . CAD (coronary artery disease)    last cath=01/2008, PCI; last nuc=08/09/08, normal  . CHF (congestive heart failure) (Corey Garcia)   . Dyslipidemia   . ED (erectile dysfunction)    viagra helps  . HTN (hypertension)   . Iron overload 03/15/2014  . 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 Murmur, cardiac    faint early systolic grade 1/6 aortic ejection murmur  . Myocardial infarction (Corey Garcia)   . Peripheral vascular disease (Corey Garcia)   . Rib pain 04/16/2014  . Skin cancer    basal cell ca X 4    Past Surgical History:  Procedure Laterality Date  . CORONARY ANGIOPLASTY WITH STENT PLACEMENT  01/31/2008   PCI with cutting balloon arthrectomy of the ostium of the circ obtuse  marg 2 vessel  . CORONARY ANGIOPLASTY WITH STENT PLACEMENT  01/27/08   nl LV function, PTCA/stenting of LAD with 3x5mm Cypher post dilated 3.34mm  . HERNIA REPAIR    . NASAL SEPTUM SURGERY       Current Outpatient Medications  Medication Sig Dispense Refill  . aspirin 81 MG tablet Take 81 mg by mouth daily.    Marland Kitchen atorvastatin (LIPITOR) 20 MG tablet Take 20 mg by mouth daily.    . bisoprolol (ZEBETA) 10 MG tablet Take 1.5 tablets by mouth daily.    . cholecalciferol (VITAMIN D) 1000 units tablet Take 1,000 Units by mouth daily.    . Clobetasol Prop Emollient Base (CLOBETASOL PROPIONATE E) 0.05 % emollient cream Apply 1 application topically daily as needed (dermatosis).     . hydrocortisone (PROCTO-MED HC) 2.5 % rectal cream Place 1 application rectally as needed for hemorrhoids or itching.    . loratadine (CLARITIN) 10 MG tablet Take 1 tablet by mouth daily.    . Multiple Vitamins-Minerals (ONE DAILY MULTIVITAMIN MEN PO) Take 1 tablet by mouth daily.     . Pantoprazole Sodium (PROTONIX PO) Take 1 tablet daily by mouth.    . tacrolimus (PROTOPIC) 0.03 % ointment Apply 1 application topically daily as needed (dermatitis on face).     . tamsulosin (FLOMAX) 0.4  MG CAPS capsule Take 0.4 mg by mouth daily.     No current facility-administered medications for this visit.     Allergies:   Codeine; Lisinopril; and Thiazide-type diuretics    Social History:  The patient  reports that he quit smoking about 35 years ago. He has never used smokeless tobacco. He reports that he drinks about 12.0 oz of alcohol per week. He reports that he does not use drugs.   Family History:  The patient's family history includes Heart disease in his brother, father, and mother; Heart failure in his brother, father, and mother.    ROS: All other systems are reviewed and negative. Unless otherwise mentioned in H&P    PHYSICAL EXAM: VS:  BP (!) 152/78   Pulse 73   Ht 5' 4.5" (1.638 m)   Wt 153 lb 3.2 oz (69.5  kg)   BMI 25.89 kg/m  , BMI Body mass index is 25.89 kg/m. GEN: Well nourished, well developed, in no acute distress  HEENT: normal  Neck: no JVD, carotid bruits, or masses Cardiac: RRR 1/6 systolic murmur; no, rubs, or gallops,no edema  Respiratory:  clear to auscultation bilaterally, normal work of breathing GI: soft, nontender, nondistended, + BS MS: no deformity or atrophy  Skin: warm and dry, no rash Neuro:  Strength and sensation are intact Psych: euthymic mood, full affect   EKG:  NSR reate of 73 bpm.    Recent Labs: 09/17/2017: TSH 1.733 10/12/2017: ALT 21; BUN 11; Creatinine, Ser 1.05; Hemoglobin 12.4; Platelets 203; Potassium 4.1; Sodium 136    Lipid Panel    Component Value Date/Time   CHOL  01/27/2008 0652    157        ATP III CLASSIFICATION:  <200     mg/dL   Desirable  200-239  mg/dL   Borderline High  >=240    mg/dL   High   TRIG 52 01/27/2008 0652   HDL 53 01/27/2008 0652   CHOLHDL 3.0 01/27/2008 0652   VLDL 10 01/27/2008 0652   LDLCALC  01/27/2008 0652    94        Total Cholesterol/HDL:CHD Risk Coronary Heart Disease Risk Table                     Men   Women  1/2 Average Risk   3.4   3.3      Wt Readings from Last 3 Encounters:  12/29/17 153 lb 3.2 oz (69.5 kg)  12/08/17 154 lb 12.8 oz (70.2 kg)  11/09/17 158 lb 12.8 oz (72 kg)      Other studies Reviewed: Vascular/Lymphatic: Calcification of the abdominal aorta. Penetrating ulcer/saccular aneurysm along the anterior RIGHT aspect of the abdominal aorta measuring 12 x 13 mm (image 42, series 2). No abdominal lymphadenopathy  Musculoskeletal: No aggressive osseous lesion  IMPRESSION: 1. Early morphologic changes in liver could indicate cirrhosis. 2. No acute findings in the abdomen pelvis. 3. Normal spleen. 4. Small penetrating ulcer versus saccular aneurysm along the abdominal aorta.   ASSESSMENT AND PLAN:  1.  AAA: Patient is scheduled to see Dr. Scot Garcia for planned surgery  scheduling. Dr. Sallyanne Kuster has cleared him for surgery per note on 12/08/2017 stating that he did not need additional work up. As he is asymptomatic for cardiac symptoms, normal  EKG,  I will not plan stress test at this time.   Chart reviewed as part of pre-operative protocol coverage. Given past medical history and time since last visit,  based on ACC/AHA guidelines, Eean Buss would be at acceptable risk for the planned procedure without further cardiovascular testing.    2. Hypertension: Elevated in the office today. He is very nervous about upcoming surgery and AAA. He is worried about rupture. Will  add low dose amlodipine 2.5 mg to regimen.  Currently on bisoprolol 15 mg daily, with normal HR. He is intolerant to lisinopril and HCTZ.   3. Hx of CAD: Hx of PTCA of high-grade stenosis of the mid LAD and ostial circumflex, he underwent two-vessel CI cutting balloon atherectomy of the OM and DES to the LAD. He had 50% RCA stenosis. Continues on secondary prevention.   4. Hypercholesterolemia: Continue statin therapy.    Current medicines are reviewed at length with the patient today.    Labs/ tests ordered today include: None   Phill Myron. West Pugh, ANP, AACC   12/29/2017 8:57 AM    [facility] [address] [place], [place] [address] Phone: [phone]; Fax: [phone]

## 2017-12-29 ENCOUNTER — Encounter: Payer: Self-pay | Admitting: Adult Health

## 2017-12-29 ENCOUNTER — Ambulatory Visit (INDEPENDENT_AMBULATORY_CARE_PROVIDER_SITE_OTHER): Payer: PPO | Admitting: Adult Health

## 2017-12-29 VITALS — BP 152/78 | HR 73 | Ht 64.5 in | Wt 153.2 lb

## 2017-12-29 DIAGNOSIS — I714 Abdominal aortic aneurysm, without rupture, unspecified: Secondary | ICD-10-CM

## 2017-12-29 DIAGNOSIS — I10 Essential (primary) hypertension: Secondary | ICD-10-CM

## 2017-12-29 DIAGNOSIS — I25118 Atherosclerotic heart disease of native coronary artery with other forms of angina pectoris: Secondary | ICD-10-CM

## 2017-12-29 MED ORDER — AMLODIPINE BESYLATE 2.5 MG PO TABS: 2.5000 mg | ORAL_TABLET | Freq: Every day | ORAL | 0 refills | Status: DC

## 2017-12-29 NOTE — Patient Instructions (Addendum)
CLEARED FOR EVAR for AAA W/Dr. Deitra Mayo @ [facility].

## 2017-12-31 ENCOUNTER — Encounter: Payer: Self-pay | Admitting: *Deleted

## 2017-12-31 ENCOUNTER — Other Ambulatory Visit: Payer: Self-pay | Admitting: *Deleted

## 2018-01-07 NOTE — Pre-Procedure Instructions (Signed)
Tilton Marsalis  01/07/2018      [id number] [address], [place] [address] Phone: [phone] Fax: [phone]  [facility] [place], [place] - [place] [facility] [place] [address] [place] [address] Phone: [phone] Fax: [phone]    Your procedure is scheduled on 01/13/2018.  Report to [facility] at 0800 A.M.  Call this number if you have problems the morning of surgery:  [phone]   Remember:  Do not eat food or drink liquids after midnight.  Take these medicines the morning of surgery with A SIP OF WATER: Acetaminophen (Tylenol) Amlodipine (Norvasc) Bisoprolol (Zebeta) loratidine (Claritin) Pantoprazole (Protonix) Tamsulosin (Flomax)  7 days prior to surgery STOP taking any Aleve, Naproxen, Ibuprofen, Motrin, Advil, Goody's, BC's, all herbal medications, fish oil, and all vitamins  Follow your doctors instructions regarding your Aspirin.  If no instructions were given by your doctor, then you will need to call your surgeons office to get instructions.      Do not wear jewelry.  Do not wear lotions, powders, or colognes, or deodorant.  Men may shave face and neck.  Do not bring valuables to the hospital.  [facility] is not responsible for any belongings or valuables.  Hearing aids, eyeglasses, contacts, dentures or bridgework may not be worn into surgery.  Leave your suitcase in the car.  After surgery it may be brought to your room.  For patients admitted to the hospital, discharge time will be determined by your treatment team.  Patients discharged the day of surgery will not be allowed to drive home.   Name and phone number of your driver:  Special instructions:   [facility]- Preparing For Surgery  Before surgery, you can play an important role. Because  skin is not sterile, your skin needs to be as free of germs as possible. You can reduce the number of germs on your skin by washing with CHG (chlorahexidine gluconate) Soap before surgery.  CHG is an antiseptic cleaner which kills germs and bonds with the skin to continue killing germs even after washing.  Oral Hygiene is also important to reduce your risk of infection.  Remember - BRUSH YOUR TEETH THE MORNING OF SURGERY  Please do not use if you have an allergy to CHG or antibacterial soaps. If your skin becomes reddened/irritated stop using the CHG.  Do not shave (including legs and underarms) for at least 48 hours prior to first CHG shower. It is OK to shave your face.  Please follow these instructions carefully.   1. Shower the NIGHT BEFORE SURGERY and the MORNING OF SURGERY with CHG.   2. If you chose to wash your hair, wash your hair first as usual with your normal shampoo.  3. After you shampoo, rinse your hair and body thoroughly to remove the shampoo.  4. Use CHG as you would any other liquid soap. You can apply CHG directly to the skin and wash gently with a scrungie or a clean washcloth.   5. Apply the CHG Soap to your body ONLY FROM THE NECK DOWN.  Do not use on open wounds or open sores. Avoid contact with your eyes, ears, mouth and genitals (private parts). Wash Face and genitals (private parts)  with your normal soap.  6. Wash thoroughly, paying special attention to the area  where your surgery will be performed.  7. Thoroughly rinse your body with warm water from the neck down.  8. DO NOT shower/wash with your normal soap after using and rinsing off the CHG Soap.  9. Pat yourself dry with a CLEAN TOWEL.  10. Wear CLEAN PAJAMAS to bed the night before surgery, wear comfortable clothes the morning of surgery  11. Place CLEAN SHEETS on your bed the night of your first shower and DO NOT SLEEP WITH PETS.    Day of Surgery: Shower as stated above. Do not apply any  deodorants/lotions. Please wear clean clothes to the hospital/surgery center. Remember to brush your teeth.      Please read over the following fact sheets that you were given.

## 2018-01-10 ENCOUNTER — Other Ambulatory Visit: Payer: Self-pay

## 2018-01-10 ENCOUNTER — Encounter (HOSPITAL_COMMUNITY): Payer: Self-pay

## 2018-01-10 ENCOUNTER — Encounter (HOSPITAL_COMMUNITY)
Admission: RE | Admit: 2018-01-10 | Discharge: 2018-01-10 | Disposition: A | Discharge status: Home / Self Care | Payer: PPO | Source: Ambulatory Visit | Attending: Vascular Surgery | Admitting: Vascular Surgery

## 2018-01-10 DIAGNOSIS — E785 Hyperlipidemia, unspecified: Secondary | ICD-10-CM | POA: Diagnosis present

## 2018-01-10 DIAGNOSIS — Z87891 Personal history of nicotine dependence: Secondary | ICD-10-CM | POA: Diagnosis not present

## 2018-01-10 DIAGNOSIS — D61818 Other pancytopenia: Secondary | ICD-10-CM | POA: Diagnosis present

## 2018-01-10 DIAGNOSIS — I723 Aneurysm of iliac artery: Secondary | ICD-10-CM | POA: Diagnosis present

## 2018-01-10 DIAGNOSIS — H5319 Other subjective visual disturbances: Secondary | ICD-10-CM | POA: Diagnosis not present

## 2018-01-10 DIAGNOSIS — I714 Abdominal aortic aneurysm, without rupture: Secondary | ICD-10-CM | POA: Diagnosis present

## 2018-01-10 DIAGNOSIS — H35033 Hypertensive retinopathy, bilateral: Secondary | ICD-10-CM | POA: Diagnosis not present

## 2018-01-10 DIAGNOSIS — Z7982 Long term (current) use of aspirin: Secondary | ICD-10-CM | POA: Diagnosis not present

## 2018-01-10 DIAGNOSIS — I700 Atherosclerosis of aorta: Secondary | ICD-10-CM | POA: Diagnosis not present

## 2018-01-10 DIAGNOSIS — Z885 Allergy status to narcotic agent status: Secondary | ICD-10-CM | POA: Diagnosis not present

## 2018-01-10 DIAGNOSIS — Z961 Presence of intraocular lens: Secondary | ICD-10-CM | POA: Diagnosis not present

## 2018-01-10 DIAGNOSIS — F1020 Alcohol dependence, uncomplicated: Secondary | ICD-10-CM | POA: Diagnosis present

## 2018-01-10 DIAGNOSIS — I350 Nonrheumatic aortic (valve) stenosis: Secondary | ICD-10-CM | POA: Diagnosis present

## 2018-01-10 DIAGNOSIS — I739 Peripheral vascular disease, unspecified: Secondary | ICD-10-CM | POA: Diagnosis present

## 2018-01-10 DIAGNOSIS — K219 Gastro-esophageal reflux disease without esophagitis: Secondary | ICD-10-CM | POA: Diagnosis present

## 2018-01-10 DIAGNOSIS — K709 Alcoholic liver disease, unspecified: Secondary | ICD-10-CM | POA: Diagnosis present

## 2018-01-10 DIAGNOSIS — H3562 Retinal hemorrhage, left eye: Secondary | ICD-10-CM | POA: Diagnosis not present

## 2018-01-10 DIAGNOSIS — Z888 Allergy status to other drugs, medicaments and biological substances status: Secondary | ICD-10-CM | POA: Diagnosis not present

## 2018-01-10 DIAGNOSIS — Z8249 Family history of ischemic heart disease and other diseases of the circulatory system: Secondary | ICD-10-CM | POA: Diagnosis not present

## 2018-01-10 DIAGNOSIS — Z955 Presence of coronary angioplasty implant and graft: Secondary | ICD-10-CM | POA: Diagnosis not present

## 2018-01-10 DIAGNOSIS — I2510 Atherosclerotic heart disease of native coronary artery without angina pectoris: Secondary | ICD-10-CM | POA: Diagnosis present

## 2018-01-10 DIAGNOSIS — I10 Essential (primary) hypertension: Secondary | ICD-10-CM | POA: Diagnosis present

## 2018-01-10 DIAGNOSIS — I252 Old myocardial infarction: Secondary | ICD-10-CM | POA: Diagnosis not present

## 2018-01-10 HISTORY — DX: Gastro-esophageal reflux disease without esophagitis: K21.9

## 2018-01-10 LAB — APTT: APTT: 21 s — AB (ref 24–36)

## 2018-01-10 LAB — URINALYSIS, ROUTINE W REFLEX MICROSCOPIC
Bilirubin Urine: NEGATIVE
Glucose, UA: NEGATIVE mg/dL
Hgb urine dipstick: NEGATIVE
KETONES UR: NEGATIVE mg/dL
LEUKOCYTES UA: NEGATIVE
NITRITE: NEGATIVE
PH: 7 (ref 5.0–8.0)
PROTEIN: NEGATIVE mg/dL
Specific Gravity, Urine: 1.013 (ref 1.005–1.030)

## 2018-01-10 LAB — COMPREHENSIVE METABOLIC PANEL
ALK PHOS: 47 U/L (ref 38–126)
ALT: 29 U/L (ref 17–63)
AST: 50 U/L — AB (ref 15–41)
Albumin: 3.8 g/dL (ref 3.5–5.0)
Anion gap: 11 (ref 5–15)
BILIRUBIN TOTAL: 0.6 mg/dL (ref 0.3–1.2)
BUN: 14 mg/dL (ref 6–20)
CALCIUM: 9.2 mg/dL (ref 8.9–10.3)
CHLORIDE: 100 mmol/L — AB (ref 101–111)
CO2: 24 mmol/L (ref 22–32)
CREATININE: 1 mg/dL (ref 0.61–1.24)
Glucose, Bld: 101 mg/dL — ABNORMAL HIGH (ref 65–99)
Potassium: 4.3 mmol/L (ref 3.5–5.1)
Sodium: 135 mmol/L (ref 135–145)
Total Protein: 7.2 g/dL (ref 6.5–8.1)

## 2018-01-10 LAB — CBC
HEMATOCRIT: 37.8 % — AB (ref 39.0–52.0)
HEMOGLOBIN: 12.6 g/dL — AB (ref 13.0–17.0)
MCH: 34.5 pg — AB (ref 26.0–34.0)
MCHC: 33.3 g/dL (ref 30.0–36.0)
MCV: 103.6 fL — ABNORMAL HIGH (ref 78.0–100.0)
PLATELETS: 164 10*3/uL (ref 150–400)
RBC: 3.65 MIL/uL — ABNORMAL LOW (ref 4.22–5.81)
RDW: 12.4 % (ref 11.5–15.5)
WBC: 5.5 10*3/uL (ref 4.0–10.5)

## 2018-01-10 LAB — BLOOD GAS, ARTERIAL
Acid-Base Excess: 1.1 mmol/L (ref 0.0–2.0)
Bicarbonate: 24.9 mmol/L (ref 20.0–28.0)
DRAWN BY: 421801
FIO2: 21
O2 Saturation: 98.1 %
PO2 ART: 118 mmHg — AB (ref 83.0–108.0)
Patient temperature: 98.6
pCO2 arterial: 37.9 mmHg (ref 32.0–48.0)
pH, Arterial: 7.434 (ref 7.350–7.450)

## 2018-01-10 LAB — TYPE AND SCREEN
ABO/RH(D): O POS
Antibody Screen: NEGATIVE

## 2018-01-10 LAB — ABO/RH: ABO/RH(D): O POS

## 2018-01-10 LAB — PROTIME-INR
INR: 0.99
PROTHROMBIN TIME: 13 s (ref 11.4–15.2)

## 2018-01-10 LAB — SURGICAL PCR SCREEN
MRSA, PCR: NEGATIVE
STAPHYLOCOCCUS AUREUS: NEGATIVE

## 2018-01-10 NOTE — Progress Notes (Signed)
PCP - Dr. [name] Cardiologist - Dr. Sallyanne Kuster  Chest x-ray - n/a EKG - 12/29/2017 Stress Test - 12/22/2012 ECHO - 11/24/2016 Cardiac Cath - 2009 with PCI  Sleep Study - patient denies  Aspirin Instructions: instructions to continue ASA  Anesthesia review: yes, cardiac hx  Patient denies shortness of breath, fever, cough and chest pain at PAT appointment   Patient verbalized understanding of instructions that were given to them at the PAT appointment. Patient was also instructed that they will need to review over the PAT instructions again at home before surgery.

## 2018-01-11 ENCOUNTER — Encounter (HOSPITAL_COMMUNITY): Payer: Self-pay

## 2018-01-11 NOTE — Progress Notes (Signed)
Anesthesia Chart Review:   Case:  [id number] Date/Time:  01/13/18 0946   Procedure:  ABDOMINAL AORTIC ENDOVASCULAR STENT GRAFT (N/A )   Anesthesia type:  General   Pre-op diagnosis:  abdominal aortic aneurysm   Location:  [facility] OR ROOM 16 / [facility]   Surgeon:  Angelia Mould, MD      DISCUSSION:  - Pt is a 72 year old male with hx CAD (DES to LAD and OM2 2009), CHF, mild aortic stenosis, hx alcohol abuse - Has cardiac clearance for surgery   VS: Pulse 69   Temp 36.7 C   Resp 20   Ht 5\' 4"  (1.626 m)   Wt 154 lb 14.4 oz (70.3 kg)   SpO2 97%   BMI 26.59 kg/m    PROVIDERS: PCP is [name], MD   Patient Care Team: [name], MD as Consulting Physician (Hematology and Oncology)  Cardiologist is Sanda Klein, MD who cleared pt for surgery 12/08/17.  Last office visit 12/29/17 with Jory Sims, NP   LABS: Labs reviewed: Acceptable for surgery. (all labs ordered are listed, but only abnormal results are displayed)  Labs Reviewed  APTT - Abnormal; Notable for the following components:      Result Value   aPTT 21 (*)    All other components within normal limits  BLOOD GAS, ARTERIAL - Abnormal; Notable for the following components:   pO2, Arterial 118 (*)    All other components within normal limits  CBC - Abnormal; Notable for the following components:   RBC 3.65 (*)    Hemoglobin 12.6 (*)    HCT 37.8 (*)    MCV 103.6 (*)    MCH 34.5 (*)    All other components within normal limits  COMPREHENSIVE METABOLIC PANEL - Abnormal; Notable for the following components:   Chloride 100 (*)    Glucose, Bld 101 (*)    AST 50 (*)    All other components within normal limits  SURGICAL PCR SCREEN  PROTIME-INR  URINALYSIS, ROUTINE W REFLEX MICROSCOPIC  TYPE AND SCREEN  ABO/RH     IMAGES:  CT angio abd pelvis 12/20/17:  - Penetrating atherosclerotic ulcer and saccular aneurysm in the infrarenal aorta results in a maximal diameter of 3.2 cm. - Aneurysmal  dilatation of the right common iliac artery results in a maximal diameter of 2.2 cm.   EKG 12/29/17: NSR   CV:  Echo 11/24/16:  - Left ventricle: The cavity size was normal. Wall thickness was normal. Systolic function was normal. The estimated ejection fraction was in the range of 60% to 65%. Wall motion was normal; there were no regional wall motion abnormalities. Doppler parameters are consistent with abnormal left ventricular relaxation (grade 1 diastolic dysfunction). - Aortic valve: Trileaflet; moderately calcified leaflets. There was mild stenosis. Mean gradient (S): 11 mm Hg. Peak gradient (S): 21 mm Hg. - Mitral valve: Mildly calcified annulus. There was trivial regurgitation. - Right ventricle: The cavity size was normal. Systolic function was normal. - Tricuspid valve: Peak RV-RA gradient (S): 21 mm Hg. - Pulmonary arteries: PA peak pressure: 24 mm Hg (S). - Inferior vena cava: The vessel was normal in size. The respirophasic diameter changes were in the normal range (>= 50%), consistent with normal central venous pressure.  Cardiac event monitor 11/09/16:  - Normal event monitor. No etiology for syncope identified  Nuclear stress test 12/22/12:  - Normal stress nuclear study. Low risk stress nuclear study. - LV Wall Motion:  NL LV  Function, EF 79%; NL Wall Motion  Cardiac cath 01/31/08:  1. Successful percutaneous coronary intervention with cutting balloon arthrotomy of the ostium of the CX OM2 vessel with 80%-90% stenosis being reduced to 0% done with bivalirudin in this patient on oral Plavix in addition to intracoronary nitroglycerin.  2. Widely patent stent in the LAD at the site of prior intervention from Jan 27, 2008.  Cardiac cath 01/27/08:  1. Normal LV function. 2. Three-vessel coronary artery disease with 95-99% diffuse stenosis in the LAD between the D1 and D2; 80-90% ostial stenosis in the CX OM2 vessel and 50% diffuse narrowing in the proximal-to-mid RCA 3. Normal  aortoiliac system. 4. Successful percutaneous transluminal coronary angioplasty/stenting of the LAD with ultimate insertion of a 3.0 x 13-mm Cypher drug-eluting stent postdilated 3.1 mm, double-bolus Integralin/600 mg oral Plavix/weight-adjusted heparinization as well as intravenous and intracoronary nitroglycerin.   Past Medical History:  Diagnosis Date  . Alcoholism ([place]) 03/15/2014  . Anemia   . Bradycardia by electrocardiogram    BB decreased 11/12/11  . CAD (coronary artery disease)    last cath=01/2008, PCI  . CHF (congestive heart failure) ([place])   . Dyslipidemia   . ED (erectile dysfunction)    viagra helps  . GERD (gastroesophageal reflux disease)   . HTN (hypertension)   . Iron overload 03/15/2014  . Liver disease due to alcohol ([place]) 03/15/2014  . Murmur, cardiac    faint early systolic grade 1/6 aortic ejection murmur  . Myocardial infarction ([place])   . Peripheral vascular disease ([place])   . Rib pain 04/16/2014  . Skin cancer    basal cell ca X 4    Past Surgical History:  Procedure Laterality Date  . CORONARY ANGIOPLASTY WITH STENT PLACEMENT  01/31/2008   PCI with cutting balloon arthrectomy of the ostium of the circ obtuse marg 2 vessel  . CORONARY ANGIOPLASTY WITH STENT PLACEMENT  01/27/08   nl LV function, PTCA/stenting of LAD with 3x7mm Cypher post dilated 3.66mm  . HERNIA REPAIR    . KNEE ARTHROSCOPY Left   . MOHS SURGERY     x4  . NASAL SEPTUM SURGERY      MEDICATIONS: . acetaminophen (TYLENOL) 650 MG CR tablet  . amLODipine (NORVASC) 2.5 MG tablet  . aspirin 81 MG tablet  . atorvastatin (LIPITOR) 20 MG tablet  . bisoprolol (ZEBETA) 10 MG tablet  . cholecalciferol (VITAMIN D) 1000 units tablet  . Clobetasol Prop Emollient Base (CLOBETASOL PROPIONATE E) 0.05 % emollient cream  . hydrocortisone (PROCTO-MED HC) 2.5 % rectal cream  . loratadine (CLARITIN) 10 MG tablet  . Multiple Vitamins-Minerals (ONE DAILY MULTIVITAMIN MEN PO)  . pantoprazole (PROTONIX) 40 MG  tablet  . tacrolimus (PROTOPIC) 0.03 % ointment  . tamsulosin (FLOMAX) 0.4 MG CAPS capsule   No current facility-administered medications for this encounter.     If no changes, I anticipate pt can proceed with surgery as scheduled.   Willeen Cass, FNP-BC [facility]/Anesthesiology Phone: [phone] 01/11/2018 10:28 AM

## 2018-01-12 DIAGNOSIS — Z961 Presence of intraocular lens: Secondary | ICD-10-CM | POA: Diagnosis not present

## 2018-01-12 DIAGNOSIS — H35033 Hypertensive retinopathy, bilateral: Secondary | ICD-10-CM | POA: Diagnosis not present

## 2018-01-12 DIAGNOSIS — H3562 Retinal hemorrhage, left eye: Secondary | ICD-10-CM | POA: Diagnosis not present

## 2018-01-12 DIAGNOSIS — H5319 Other subjective visual disturbances: Secondary | ICD-10-CM | POA: Diagnosis not present

## 2018-01-13 ENCOUNTER — Inpatient Hospital Stay (HOSPITAL_COMMUNITY)
Admission: RE | Admit: 2018-01-13 | Discharge: 2018-01-14 | DRG: 269 | Disposition: A | Discharge status: Home / Self Care | Payer: PPO | Source: Ambulatory Visit | Attending: Vascular Surgery | Admitting: Vascular Surgery

## 2018-01-13 ENCOUNTER — Inpatient Hospital Stay (HOSPITAL_COMMUNITY): Payer: PPO

## 2018-01-13 ENCOUNTER — Inpatient Hospital Stay (HOSPITAL_COMMUNITY): Payer: PPO | Admitting: Emergency Medicine

## 2018-01-13 ENCOUNTER — Encounter (HOSPITAL_COMMUNITY): Payer: Self-pay | Admitting: *Deleted

## 2018-01-13 ENCOUNTER — Inpatient Hospital Stay (HOSPITAL_COMMUNITY): Payer: PPO | Admitting: Certified Registered Nurse Anesthetist

## 2018-01-13 ENCOUNTER — Encounter (HOSPITAL_COMMUNITY)
Admission: RE | Disposition: A | Discharge status: Home / Self Care | Payer: Self-pay | Source: Ambulatory Visit | Attending: Vascular Surgery

## 2018-01-13 DIAGNOSIS — K219 Gastro-esophageal reflux disease without esophagitis: Secondary | ICD-10-CM | POA: Diagnosis present

## 2018-01-13 DIAGNOSIS — Z7982 Long term (current) use of aspirin: Secondary | ICD-10-CM

## 2018-01-13 DIAGNOSIS — I252 Old myocardial infarction: Secondary | ICD-10-CM

## 2018-01-13 DIAGNOSIS — E785 Hyperlipidemia, unspecified: Secondary | ICD-10-CM | POA: Diagnosis present

## 2018-01-13 DIAGNOSIS — Z885 Allergy status to narcotic agent status: Secondary | ICD-10-CM | POA: Diagnosis not present

## 2018-01-13 DIAGNOSIS — I714 Abdominal aortic aneurysm, without rupture, unspecified: Secondary | ICD-10-CM | POA: Diagnosis present

## 2018-01-13 DIAGNOSIS — Z955 Presence of coronary angioplasty implant and graft: Secondary | ICD-10-CM | POA: Diagnosis not present

## 2018-01-13 DIAGNOSIS — I739 Peripheral vascular disease, unspecified: Secondary | ICD-10-CM | POA: Diagnosis present

## 2018-01-13 DIAGNOSIS — F1020 Alcohol dependence, uncomplicated: Secondary | ICD-10-CM | POA: Diagnosis present

## 2018-01-13 DIAGNOSIS — Z888 Allergy status to other drugs, medicaments and biological substances status: Secondary | ICD-10-CM

## 2018-01-13 DIAGNOSIS — Z87891 Personal history of nicotine dependence: Secondary | ICD-10-CM | POA: Diagnosis not present

## 2018-01-13 DIAGNOSIS — D61818 Other pancytopenia: Secondary | ICD-10-CM | POA: Diagnosis present

## 2018-01-13 DIAGNOSIS — K709 Alcoholic liver disease, unspecified: Secondary | ICD-10-CM | POA: Diagnosis present

## 2018-01-13 DIAGNOSIS — I723 Aneurysm of iliac artery: Secondary | ICD-10-CM | POA: Diagnosis present

## 2018-01-13 DIAGNOSIS — I10 Essential (primary) hypertension: Secondary | ICD-10-CM | POA: Diagnosis present

## 2018-01-13 DIAGNOSIS — I350 Nonrheumatic aortic (valve) stenosis: Secondary | ICD-10-CM | POA: Diagnosis present

## 2018-01-13 DIAGNOSIS — Z8249 Family history of ischemic heart disease and other diseases of the circulatory system: Secondary | ICD-10-CM

## 2018-01-13 DIAGNOSIS — I2510 Atherosclerotic heart disease of native coronary artery without angina pectoris: Secondary | ICD-10-CM | POA: Diagnosis present

## 2018-01-13 DIAGNOSIS — Z9889 Other specified postprocedural states: Secondary | ICD-10-CM

## 2018-01-13 HISTORY — PX: ABDOMINAL AORTIC ENDOVASCULAR STENT GRAFT: SHX5707

## 2018-01-13 LAB — MAGNESIUM: Magnesium: 1.6 mg/dL — ABNORMAL LOW (ref 1.7–2.4)

## 2018-01-13 LAB — CBC
HCT: 35 % — ABNORMAL LOW (ref 39.0–52.0)
HEMATOCRIT: 34.2 % — AB (ref 39.0–52.0)
Hemoglobin: 11.6 g/dL — ABNORMAL LOW (ref 13.0–17.0)
Hemoglobin: 11.5 g/dL — ABNORMAL LOW (ref 13.0–17.0)
MCH: 34.3 pg — ABNORMAL HIGH (ref 26.0–34.0)
MCH: 34.2 pg — AB (ref 26.0–34.0)
MCHC: 33.1 g/dL (ref 30.0–36.0)
MCHC: 33.6 g/dL (ref 30.0–36.0)
MCV: 103.6 fL — ABNORMAL HIGH (ref 78.0–100.0)
MCV: 101.8 fL — AB (ref 78.0–100.0)
PLATELETS: 157 10*3/uL (ref 150–400)
Platelets: 164 10*3/uL (ref 150–400)
RBC: 3.38 MIL/uL — ABNORMAL LOW (ref 4.22–5.81)
RBC: 3.36 MIL/uL — ABNORMAL LOW (ref 4.22–5.81)
RDW: 11.9 % (ref 11.5–15.5)
RDW: 12 % (ref 11.5–15.5)
WBC: 4.2 10*3/uL (ref 4.0–10.5)
WBC: 6 10*3/uL (ref 4.0–10.5)

## 2018-01-13 LAB — CREATININE, SERUM
Creatinine, Ser: 0.86 mg/dL (ref 0.61–1.24)
GFR calc non Af Amer: >60 mL/min (ref >60)

## 2018-01-13 LAB — BASIC METABOLIC PANEL
Anion gap: 13 (ref 5–15)
BUN: 8 mg/dL (ref 6–20)
CALCIUM: 8.4 mg/dL — AB (ref 8.9–10.3)
CHLORIDE: 102 mmol/L (ref 101–111)
CO2: 25 mmol/L (ref 22–32)
CREATININE: 0.87 mg/dL (ref 0.61–1.24)
Glucose, Bld: 83 mg/dL (ref 65–99)
Potassium: 3.4 mmol/L — ABNORMAL LOW (ref 3.5–5.1)
Sodium: 140 mmol/L (ref 135–145)

## 2018-01-13 LAB — PROTIME-INR
INR: 1.25
PROTHROMBIN TIME: 15.6 s — AB (ref 11.4–15.2)

## 2018-01-13 LAB — APTT: aPTT: 35 seconds (ref 24–36)

## 2018-01-13 SURGERY — INSERTION, ENDOVASCULAR STENT GRAFT, AORTA, ABDOMINAL
Anesthesia: General | Site: Abdomen

## 2018-01-13 MED ORDER — PROTAMINE SULFATE 10 MG/ML IV SOLN
INTRAVENOUS | Status: DC | PRN
  Administered 2018-01-13: 20 mg via INTRAVENOUS
  Administered 2018-01-13 (×2): 10 mg via INTRAVENOUS

## 2018-01-13 MED ORDER — POTASSIUM CHLORIDE CRYS ER 20 MEQ PO TBCR: 20.0000 meq | EXTENDED_RELEASE_TABLET | Freq: Every day | ORAL | Status: DC | PRN

## 2018-01-13 MED ORDER — 0.9 % SODIUM CHLORIDE (POUR BTL) OPTIME
TOPICAL | Status: DC | PRN
  Administered 2018-01-13: 1000 mL

## 2018-01-13 MED ORDER — SODIUM CHLORIDE 0.9 % IV SOLN
INTRAVENOUS | Status: AC
  Filled 2018-01-13: qty 1.2

## 2018-01-13 MED ORDER — MAGNESIUM SULFATE 2 GM/50ML IV SOLN: 2.0000 g | Freq: Every day | INTRAVENOUS | Status: DC | PRN

## 2018-01-13 MED ORDER — ACETAMINOPHEN 325 MG RE SUPP: 325.0000 mg | RECTAL | Status: DC | PRN

## 2018-01-13 MED ORDER — LACTATED RINGERS IV SOLN
INTRAVENOUS | Status: DC
  Administered 2018-01-13: 09:00:00 via INTRAVENOUS

## 2018-01-13 MED ORDER — SUGAMMADEX SODIUM 200 MG/2ML IV SOLN
INTRAVENOUS | Status: DC | PRN
  Administered 2018-01-13: 139.8 mg via INTRAVENOUS

## 2018-01-13 MED ORDER — HEPARIN SODIUM (PORCINE) 1000 UNIT/ML IJ SOLN
INTRAMUSCULAR | Status: DC | PRN
  Administered 2018-01-13: 7000 [IU] via INTRAVENOUS

## 2018-01-13 MED ORDER — ONDANSETRON HCL 4 MG/2ML IJ SOLN
INTRAMUSCULAR | Status: DC | PRN
  Administered 2018-01-13: 4 mg via INTRAVENOUS

## 2018-01-13 MED ORDER — ACETAMINOPHEN 325 MG PO TABS
325.0000 mg | ORAL_TABLET | ORAL | Status: DC | PRN
  Administered 2018-01-13: 650 mg via ORAL
  Filled 2018-01-13: qty 2

## 2018-01-13 MED ORDER — CEFAZOLIN SODIUM-DEXTROSE 2-3 GM-%(50ML) IV SOLR
INTRAVENOUS | Status: DC | PRN
  Administered 2018-01-13: 2 g via INTRAVENOUS

## 2018-01-13 MED ORDER — DOCUSATE SODIUM 100 MG PO CAPS
100.0000 mg | ORAL_CAPSULE | Freq: Every day | ORAL | Status: DC
  Administered 2018-01-14: 100 mg via ORAL
  Filled 2018-01-13: qty 1

## 2018-01-13 MED ORDER — ONDANSETRON HCL 4 MG/2ML IJ SOLN: 4.0000 mg | Freq: Four times a day (QID) | INTRAMUSCULAR | Status: DC | PRN

## 2018-01-13 MED ORDER — FENTANYL CITRATE (PF) 100 MCG/2ML IJ SOLN
INTRAMUSCULAR | Status: DC | PRN
  Administered 2018-01-13: 100 ug via INTRAVENOUS

## 2018-01-13 MED ORDER — ROCURONIUM BROMIDE 50 MG/5ML IV SOLN
INTRAVENOUS | Status: AC
  Filled 2018-01-13: qty 1

## 2018-01-13 MED ORDER — ROCURONIUM BROMIDE 100 MG/10ML IV SOLN
INTRAVENOUS | Status: DC | PRN
  Administered 2018-01-13: 50 mg via INTRAVENOUS

## 2018-01-13 MED ORDER — PANTOPRAZOLE SODIUM 40 MG PO TBEC
40.0000 mg | DELAYED_RELEASE_TABLET | Freq: Every day | ORAL | Status: DC
  Administered 2018-01-14: 40 mg via ORAL
  Filled 2018-01-13: qty 1

## 2018-01-13 MED ORDER — ENOXAPARIN SODIUM 40 MG/0.4ML ~~LOC~~ SOLN
40.0000 mg | SUBCUTANEOUS | Status: DC
  Administered 2018-01-14: 40 mg via SUBCUTANEOUS
  Filled 2018-01-13: qty 0.4

## 2018-01-13 MED ORDER — LIDOCAINE 2% (20 MG/ML) 5 ML SYRINGE
INTRAMUSCULAR | Status: AC
  Filled 2018-01-13: qty 5

## 2018-01-13 MED ORDER — ONDANSETRON HCL 4 MG/2ML IJ SOLN
INTRAMUSCULAR | Status: AC
  Filled 2018-01-13: qty 2

## 2018-01-13 MED ORDER — FENTANYL CITRATE (PF) 250 MCG/5ML IJ SOLN
INTRAMUSCULAR | Status: AC
  Filled 2018-01-13: qty 5

## 2018-01-13 MED ORDER — ATORVASTATIN CALCIUM 20 MG PO TABS
20.0000 mg | ORAL_TABLET | Freq: Every day | ORAL | Status: DC
  Administered 2018-01-13 – 2018-01-14 (×2): 20 mg via ORAL
  Filled 2018-01-13 (×2): qty 1

## 2018-01-13 MED ORDER — OXYCODONE HCL 5 MG PO TABS: 5.0000 mg | ORAL_TABLET | ORAL | Status: DC | PRN

## 2018-01-13 MED ORDER — VITAMIN D 1000 UNITS PO TABS
1000.0000 [IU] | ORAL_TABLET | Freq: Every day | ORAL | Status: DC
  Administered 2018-01-13 – 2018-01-14 (×2): 1000 [IU] via ORAL
  Filled 2018-01-13 (×2): qty 1

## 2018-01-13 MED ORDER — LABETALOL HCL 5 MG/ML IV SOLN: 10.0000 mg | INTRAVENOUS | Status: DC | PRN

## 2018-01-13 MED ORDER — CEFAZOLIN SODIUM-DEXTROSE 2-4 GM/100ML-% IV SOLN
INTRAVENOUS | Status: AC
  Administered 2018-01-13: 2 g via INTRAVENOUS
  Filled 2018-01-13: qty 100

## 2018-01-13 MED ORDER — SODIUM CHLORIDE 0.9 % IV SOLN
INTRAVENOUS | Status: DC
  Administered 2018-01-13: 18:00:00 via INTRAVENOUS

## 2018-01-13 MED ORDER — GUAIFENESIN-DM 100-10 MG/5ML PO SYRP: 15.0000 mL | ORAL_SOLUTION | ORAL | Status: DC | PRN

## 2018-01-13 MED ORDER — SODIUM CHLORIDE 0.9 % IV SOLN: 500.0000 mL | Freq: Once | INTRAVENOUS | Status: DC | PRN

## 2018-01-13 MED ORDER — METOPROLOL TARTRATE 5 MG/5ML IV SOLN: 2.0000 mg | INTRAVENOUS | Status: DC | PRN

## 2018-01-13 MED ORDER — BISACODYL 5 MG PO TBEC: 5.0000 mg | DELAYED_RELEASE_TABLET | Freq: Every day | ORAL | Status: DC | PRN

## 2018-01-13 MED ORDER — LACTATED RINGERS IV SOLN
INTRAVENOUS | Status: DC | PRN
  Administered 2018-01-13: 12:00:00 via INTRAVENOUS

## 2018-01-13 MED ORDER — MORPHINE SULFATE (PF) 2 MG/ML IV SOLN: 0.5000 mg | INTRAVENOUS | Status: DC | PRN

## 2018-01-13 MED ORDER — CEFAZOLIN SODIUM-DEXTROSE 2-4 GM/100ML-% IV SOLN
2.0000 g | Freq: Three times a day (TID) | INTRAVENOUS | Status: AC
  Administered 2018-01-13 – 2018-01-14 (×2): 2 g via INTRAVENOUS
  Filled 2018-01-13 (×2): qty 100

## 2018-01-13 MED ORDER — TAMSULOSIN HCL 0.4 MG PO CAPS
0.4000 mg | ORAL_CAPSULE | Freq: Every day | ORAL | Status: DC
  Administered 2018-01-14: 0.4 mg via ORAL
  Filled 2018-01-13: qty 1

## 2018-01-13 MED ORDER — PROPOFOL 10 MG/ML IV BOLUS
INTRAVENOUS | Status: AC
  Filled 2018-01-13: qty 20

## 2018-01-13 MED ORDER — ALUM & MAG HYDROXIDE-SIMETH 200-200-20 MG/5ML PO SUSP: 15.0000 mL | ORAL | Status: DC | PRN

## 2018-01-13 MED ORDER — LIDOCAINE HCL (CARDIAC) PF 100 MG/5ML IV SOSY
PREFILLED_SYRINGE | INTRAVENOUS | Status: DC | PRN
  Administered 2018-01-13: 30 mg via INTRAVENOUS

## 2018-01-13 MED ORDER — ASPIRIN 81 MG PO CHEW
81.0000 mg | CHEWABLE_TABLET | Freq: Every day | ORAL | Status: DC
  Administered 2018-01-14: 81 mg via ORAL
  Filled 2018-01-13: qty 1

## 2018-01-13 MED ORDER — ASPIRIN 81 MG PO TABS: 81.0000 mg | ORAL_TABLET | Freq: Every day | ORAL | Status: DC

## 2018-01-13 MED ORDER — IODIXANOL 320 MG/ML IV SOLN
INTRAVENOUS | Status: DC | PRN
  Administered 2018-01-13: 40.3 mL via INTRAVENOUS

## 2018-01-13 MED ORDER — SODIUM CHLORIDE 0.9 % IV SOLN
INTRAVENOUS | Status: DC | PRN
  Administered 2018-01-13: 14:00:00

## 2018-01-13 MED ORDER — LORATADINE 10 MG PO TABS
10.0000 mg | ORAL_TABLET | Freq: Every day | ORAL | Status: DC
  Administered 2018-01-14: 10 mg via ORAL
  Filled 2018-01-13: qty 1

## 2018-01-13 MED ORDER — PHENOL 1.4 % MT LIQD: 1.0000 | OROMUCOSAL | Status: DC | PRN

## 2018-01-13 MED ORDER — HYDRALAZINE HCL 20 MG/ML IJ SOLN
INTRAMUSCULAR | Status: DC | PRN
  Administered 2018-01-13: 5 mg via INTRAVENOUS

## 2018-01-13 MED ORDER — BISOPROLOL FUMARATE 5 MG PO TABS
15.0000 mg | ORAL_TABLET | Freq: Every day | ORAL | Status: DC
  Administered 2018-01-14: 15 mg via ORAL
  Filled 2018-01-13: qty 3

## 2018-01-13 MED ORDER — PROPOFOL 10 MG/ML IV BOLUS
INTRAVENOUS | Status: DC | PRN
  Administered 2018-01-13: 150 mg via INTRAVENOUS

## 2018-01-13 MED ORDER — HYDRALAZINE HCL 20 MG/ML IJ SOLN: 5.0000 mg | INTRAMUSCULAR | Status: DC | PRN

## 2018-01-13 MED ORDER — AMLODIPINE BESYLATE 5 MG PO TABS
2.5000 mg | ORAL_TABLET | Freq: Every day | ORAL | Status: DC
  Administered 2018-01-14: 2.5 mg via ORAL
  Filled 2018-01-13: qty 1

## 2018-01-13 MED ORDER — PHENYLEPHRINE 40 MCG/ML (10ML) SYRINGE FOR IV PUSH (FOR BLOOD PRESSURE SUPPORT)
PREFILLED_SYRINGE | INTRAVENOUS | Status: AC
  Filled 2018-01-13: qty 10

## 2018-01-13 MED ORDER — HYDRALAZINE HCL 20 MG/ML IJ SOLN
INTRAMUSCULAR | Status: AC
  Filled 2018-01-13: qty 1

## 2018-01-13 MED ORDER — SENNOSIDES-DOCUSATE SODIUM 8.6-50 MG PO TABS: 1.0000 | ORAL_TABLET | Freq: Every evening | ORAL | Status: DC | PRN

## 2018-01-13 MED ORDER — SUGAMMADEX SODIUM 200 MG/2ML IV SOLN
INTRAVENOUS | Status: AC
  Filled 2018-01-13: qty 2

## 2018-01-13 MED ORDER — FENTANYL CITRATE (PF) 100 MCG/2ML IJ SOLN: 25.0000 ug | INTRAMUSCULAR | Status: DC | PRN

## 2018-01-13 SURGICAL SUPPLY — 56 items
ADH SKN CLS APL DERMABOND .7 (GAUZE/BANDAGES/DRESSINGS) ×1
BLADE CLIPPER SURG (BLADE) ×3 IMPLANT
CANISTER SUCT 3000ML PPV (MISCELLANEOUS) ×3 IMPLANT
CATH ACCU-VU SIZ PIG 5F 70CM (CATHETERS) IMPLANT
CATH ANGIO 5F BER2 65CM (CATHETERS) IMPLANT
CATH BEACON 5.038 65CM KMP-01 (CATHETERS) IMPLANT
CATH OMNI FLUSH .035X70CM (CATHETERS) ×2 IMPLANT
COVER PROBE W GEL 5X96 (DRAPES) IMPLANT
DERMABOND ADVANCED (GAUZE/BANDAGES/DRESSINGS) ×2
DERMABOND ADVANCED .7 DNX12 (GAUZE/BANDAGES/DRESSINGS) ×1 IMPLANT
DEVICE CLOSURE PERCLS PRGLD 6F (VASCULAR PRODUCTS) IMPLANT
DRSG TEGADERM 2-3/8X2-3/4 SM (GAUZE/BANDAGES/DRESSINGS) ×6 IMPLANT
DRYSEAL FLEXSHEATH 20FR 33CM (SHEATH) ×2
ELECT REM PT RETURN 9FT ADLT (ELECTROSURGICAL) ×6
ELECTRODE REM PT RTRN 9FT ADLT (ELECTROSURGICAL) ×2 IMPLANT
ENDOPROSTHESIS THORAC 26X21X10 (Endovascular Graft) IMPLANT
ENDOPROTH THORACIC 26X21X10 (Endovascular Graft) ×3 IMPLANT
GAUZE SPONGE 2X2 8PLY STRL LF (GAUZE/BANDAGES/DRESSINGS) ×2 IMPLANT
GLOVE BIO SURGEON STRL SZ7.5 (GLOVE) ×3 IMPLANT
GLOVE BIOGEL PI IND STRL 6.5 (GLOVE) IMPLANT
GLOVE BIOGEL PI IND STRL 7.5 (GLOVE) IMPLANT
GLOVE BIOGEL PI IND STRL 8 (GLOVE) ×1 IMPLANT
GLOVE BIOGEL PI INDICATOR 6.5 (GLOVE) ×8
GLOVE BIOGEL PI INDICATOR 7.5 (GLOVE) ×2
GLOVE BIOGEL PI INDICATOR 8 (GLOVE) ×2
GLOVE ECLIPSE 6.5 STRL STRAW (GLOVE) ×2 IMPLANT
GOWN STRL REUS W/ TWL LRG LVL3 (GOWN DISPOSABLE) ×3 IMPLANT
GOWN STRL REUS W/TWL LRG LVL3 (GOWN DISPOSABLE) ×9
GRAFT BALLN CATH 65CM (STENTS) IMPLANT
GUIDEWIRE SAF TJ AMPL .035X180 (WIRE) ×2 IMPLANT
GUIDEWIRE SAFE TJ AMPLATZ EXST (WIRE) ×2 IMPLANT
KIT BASIN OR (CUSTOM PROCEDURE TRAY) ×3 IMPLANT
KIT TURNOVER KIT B (KITS) ×3 IMPLANT
NDL PERC 18GX7CM (NEEDLE) ×1 IMPLANT
NEEDLE PERC 18GX7CM (NEEDLE) ×6 IMPLANT
NS IRRIG 1000ML POUR BTL (IV SOLUTION) ×3 IMPLANT
PACK ENDOVASCULAR (PACKS) ×3 IMPLANT
PAD ARMBOARD 7.5X6 YLW CONV (MISCELLANEOUS) ×6 IMPLANT
PERCLOSE PROGLIDE 6F (VASCULAR PRODUCTS) ×6
SHEATH AVANTI 11CM 8FR (SHEATH) IMPLANT
SHEATH BRITE TIP 8FR 23CM (SHEATH) ×2 IMPLANT
SHEATH DRYSEAL FLEX 20FR 33CM (SHEATH) IMPLANT
SPONGE GAUZE 2X2 STER 10/PKG (GAUZE/BANDAGES/DRESSINGS) ×4
STAPLER VISISTAT (STAPLE) IMPLANT
STENT GRAFT BALLN CATH 65CM (STENTS) ×2
STOPCOCK MORSE 400PSI 3WAY (MISCELLANEOUS) ×5 IMPLANT
SUT PROLENE 5 0 C 1 24 (SUTURE) IMPLANT
SUT VIC AB 4-0 PS2 18 (SUTURE) ×2 IMPLANT
SUT VICRYL 4-0 PS2 18IN ABS (SUTURE) ×6 IMPLANT
SYR 30ML LL (SYRINGE) ×3 IMPLANT
SYR MEDRAD MARK V 150ML (SYRINGE) ×2 IMPLANT
TOWEL GREEN STERILE (TOWEL DISPOSABLE) ×3 IMPLANT
TRAY FOLEY MTR SLVR 16FR STAT (SET/KITS/TRAYS/PACK) ×3 IMPLANT
TUBING HIGH PRESSURE 120CM (CONNECTOR) ×7 IMPLANT
WIRE AMPLATZ SS-J .035X180CM (WIRE) IMPLANT
WIRE BENTSON .035X145CM (WIRE) ×2 IMPLANT

## 2018-01-13 NOTE — Progress Notes (Signed)
Patient received from PACU, A&Ox4, VSS. Telemetry applied and CCMD notified. 

## 2018-01-13 NOTE — Anesthesia Procedure Notes (Signed)
Procedure Name: Intubation Date/Time: 01/13/2018 1:28 PM Performed by: Eligha Bridegroom, CRNA Pre-anesthesia Checklist: Patient identified, Emergency Drugs available, Suction available and Patient being monitored Patient Re-evaluated:Patient Re-evaluated prior to induction Oxygen Delivery Method: Circle system utilized Preoxygenation: Pre-oxygenation with 100% oxygen Induction Type: IV induction Ventilation: Mask ventilation without difficulty and Oral airway inserted - appropriate to patient size Grade View: Grade II Tube type: Oral Tube size: 7.5 mm Number of attempts: 1 Airway Equipment and Method: Stylet Placement Confirmation: ETT inserted through vocal cords under direct vision,  positive ETCO2 and breath sounds checked- equal and bilateral Secured at: 21 cm Tube secured with: Tape Dental Injury: Teeth and Oropharynx as per pre-operative assessment

## 2018-01-13 NOTE — Discharge Instructions (Signed)
   [facility]   Discharge Instructions  Endovascular Aortic Aneurysm Repair  Please refer to the following instructions for your post-procedure care. Your surgeon or Physician Assistant will discuss any changes with you.  Activity  You are encouraged to walk as much as you can. You can slowly return to normal activities but must avoid strenuous activity and heavy lifting until your doctor tells you it's OK. Avoid activities such as vacuuming or swinging a gold club. It is normal to feel tired for several weeks after your surgery. Do not drive until your doctor gives the OK and you are no longer taking prescription pain medications. It is also normal to have difficulty with sleep habits, eating, and bowel movements after surgery. These will go away with time.  Bathing/Showering  You may shower after you go home. If you have an incision, do not soak in a bathtub, hot tub, or swim until the incision heals completely.  Incision Care  Shower every day. Clean your incision with mild soap and water. Pat the area dry with a clean towel. You do not need a bandage unless otherwise instructed. Do not apply any ointments or creams to your incision. If you clothing is irritating, you may cover your incision with a dry gauze pad.  Diet  Resume your normal diet. There are no special food restrictions following this procedure. A low fat/low cholesterol diet is recommended for all patients with vascular disease. In order to heal from your surgery, it is CRITICAL to get adequate nutrition. Your body requires vitamins, minerals, and protein. Vegetables are the best source of vitamins and minerals. Vegetables also provide the perfect balance of protein. Processed food has little nutritional value, so try to avoid this.  Medications  Resume taking all of your medications unless your doctor or nurse practitioner tells you not to. If your incision is causing pain, you may take  over-the-counter pain relievers such as acetaminophen (Tylenol). If you were prescribed a stronger pain medication, please be aware these medications can cause nausea and constipation. Prevent nausea by taking the medication with a snack or meal. Avoid constipation by drinking plenty of fluids and eating foods with a high amount of fiber, such as fruits, vegetables, and grains. Do not take Tylenol if you are taking prescription pain medications.   Follow up  Our office will schedule a follow-up appointment with a C.T. scan 3-4 weeks after your surgery.  Please call us immediately for any of the following conditions  Severe or worsening pain in your legs or feet or in your abdomen back or chest. Increased pain, redness, drainage (pus) from your incision sit. Increased abdominal pain, bloating, nausea, vomiting or persistent diarrhea. Fever of 101 degrees or higher. Swelling in your leg (s),  Reduce your risk of vascular disease  Stop smoking. If you would like help call [facility] at 1-800-QUIT-NOW (1-800-784-8669) or [facility] at 336-586-4000. Manage your cholesterol Maintain a desired weight Control your diabetes Keep your blood pressure down  If you have questions, please call the office at [phone].   

## 2018-01-13 NOTE — Anesthesia Procedure Notes (Signed)
Arterial Line Insertion Start/End5/16/2019 12:40 PM, 01/13/2018 12:45 PM Performed by: Lowella Dell, CRNA, CRNA  Preanesthetic checklist: patient identified, IV checked, site marked, risks and benefits discussed, surgical consent, monitors and equipment checked, pre-op evaluation, timeout performed and anesthesia consent Lidocaine 1% used for infiltration Left, radial was placed Catheter size: 20 G Hand hygiene performed  and maximum sterile barriers used   Attempts: 1 Procedure performed without using ultrasound guided technique. Following insertion, Biopatch and dressing applied. Post procedure assessment: normal  Patient tolerated the procedure well with no immediate complications.

## 2018-01-13 NOTE — H&P (Signed)
REASON FOR CONSULT:    Abdominal aortic aneurysm.  The consult is requested by Dr. Alvy Bimler.  HPI:   Corey Garcia is a pleasant 72 y.o. male, who has a history of alcohol use and possible cirrhosis.  He had elevated liver function tests which prompted a CT of the abdomen which was done with contrast.  An incidental finding was a saccular abdominal aortic aneurysm and the patient was sent for vascular consultation.  I reviewed the note from the referring office on 11/09/2017.  The patient has a history of hemochromatosis.  He does not need lobotomy.  The patient also has a history of liver disease secondary to alcohol.  His CT scan shows early signs of liver cirrhosis.  The patient has a history of intermittent pancytopenia.  This is also felt to potentially be related to alcoholism.   Patient denies any abdominal pain or back pain.  He does have a history of coronary artery disease and is followed by Dr. Sallyanne Kuster.  He had a heart attack in 2009.  He underwent PTCA at that time.  He also has a history of some aortic stenosis.  He denies any recent chest pain or chest pressure.  He denies significant shortness of breath with exertion.  He denies orthopnea.  He denies any history of claudication or rest pain.      Past Medical History:  Diagnosis Date  . Alcoholism ([place]) 03/15/2014  . Anemia   . Bradycardia by electrocardiogram    BB decreased 11/12/11  . CAD (coronary artery disease)    last cath=01/2008, PCI; last nuc=08/09/08, normal  . CHF (congestive heart failure) ([place])   . Dyslipidemia   . ED (erectile dysfunction)    viagra helps  . HTN (hypertension)   . Iron overload 03/15/2014  . Liver disease due to alcohol ([place]) 03/15/2014  . Murmur, cardiac    faint early systolic grade 1/6 aortic ejection murmur  . Myocardial infarction ([place])   . Peripheral vascular disease ([place])   . Rib pain 04/16/2014  . Skin cancer    basal cell ca X 4         Family  History  Problem Relation Age of Onset  . Heart failure Mother        heart disease  . Heart disease Mother   . Heart failure Father        heart disease  . Heart disease Father   . Heart disease Brother   . Heart failure Brother        "bad heart valve"    SOCIAL HISTORY: Social History        Socioeconomic History  . Marital status: Married    Spouse name: Not on file  . Number of children: Not on file  . Years of education: Not on file  . Highest education level: Not on file  Occupational History  . Occupation: Pharmacist, community  Social Needs  . Financial resource strain: Not on file  . Food insecurity:    Worry: Not on file    Inability: Not on file  . Transportation needs:    Medical: Not on file    Non-medical: Not on file  Tobacco Use  . Smoking status: Former Smoker    Last attempt to quit: 10/31/1982    Years since quitting: 35.1  . Smokeless tobacco: Never Used  Substance and Sexual Activity  . Alcohol use: Yes    Alcohol/week: 12.0 oz    Types: 20 Cans  of beer per week  . Drug use: No  . Sexual activity: Not on file  Lifestyle  . Physical activity:    Days per week: Not on file    Minutes per session: Not on file  . Stress: Not on file  Relationships  . Social connections:    Talks on phone: Not on file    Gets together: Not on file    Attends religious service: Not on file    Active member of club or organization: Not on file    Attends meetings of clubs or organizations: Not on file    Relationship status: Not on file  . Intimate partner violence:    Fear of current or ex partner: Not on file    Emotionally abused: Not on file    Physically abused: Not on file    Forced sexual activity: Not on file  Other Topics Concern  . Not on file  Social History Narrative  . Not on file         Allergies  Allergen Reactions  . Codeine Nausea Only  . Lisinopril     PHOTO  SENSITIVITY ON ARMS  . Thiazide-Type Diuretics Other (See Comments)    "Lowered my blood pressure"          Current Outpatient Medications  Medication Sig Dispense Refill  . aspirin 81 MG tablet Take 81 mg by mouth daily.    Marland Kitchen atorvastatin (LIPITOR) 20 MG tablet Take 20 mg by mouth daily.    . bisoprolol (ZEBETA) 10 MG tablet Take 1.5 tablets by mouth daily.    . cholecalciferol (VITAMIN D) 1000 units tablet Take 1,000 Units by mouth daily.    . Clobetasol Prop Emollient Base (CLOBETASOL PROPIONATE E) 0.05 % emollient cream Apply 1 application topically daily as needed (dermatosis).     . hydrocortisone (PROCTO-MED HC) 2.5 % rectal cream Place 1 application rectally as needed for hemorrhoids or itching.    . loratadine (CLARITIN) 10 MG tablet Take 1 tablet by mouth daily.    . Multiple Vitamins-Minerals (ONE DAILY MULTIVITAMIN MEN PO) Take 1 tablet by mouth daily.     . Pantoprazole Sodium (PROTONIX PO) Take 1 tablet daily by mouth.    . tacrolimus (PROTOPIC) 0.03 % ointment Apply 1 application topically daily as needed (dermatitis on face).     . tamsulosin (FLOMAX) 0.4 MG CAPS capsule Take 0.4 mg by mouth daily.     No current facility-administered medications for this visit.     REVIEW OF SYSTEMS:  [X]  denotes positive finding, [ ]  denotes negative finding Cardiac  Comments:  Chest pain or chest pressure:    Shortness of breath upon exertion:    Short of breath when lying flat:    Irregular heart rhythm:        Vascular    Pain in calf, thigh, or hip brought on by ambulation:    Pain in feet at night that wakes you up from your sleep:     Blood clot in your veins:    Leg swelling:         Pulmonary    Oxygen at home:    Productive cough:     Wheezing:         Neurologic    Sudden weakness in arms or legs:     Sudden numbness in arms or legs:     Sudden onset of difficulty speaking or slurred speech:     Temporary loss of vision in  one eye:     Problems with dizziness:         Gastrointestinal    Blood in stool:     Vomited blood:         Genitourinary    Burning when urinating:     Blood in urine:        Psychiatric    Major depression:         Hematologic    Bleeding problems:    Problems with blood clotting too easily:        Skin    Rashes or ulcers:        Constitutional    Fever or chills:     PHYSICAL EXAM:       Vitals:   12/08/17 0859 12/08/17 0901  BP: (!) 175/79 (!) 174/81  Pulse: 63   Resp: 20   Temp: 98 F (36.7 C)   TempSrc: Oral   SpO2: 98%   Weight: 154 lb 12.8 oz (70.2 kg)   Height: 5' 4.5" (1.638 m)    Vitals:   01/13/18 0821  BP: 135/72  Pulse: 62  Resp: 20  Temp: 98.1 F (36.7 C)  SpO2: 100%     GENERAL: The patient is a well-nourished male, in no acute distress. The vital signs are documented above. CARDIAC: There is a regular rate and rhythm.  VASCULAR: I do not detect carotid bruits. On the right side he has a palpable femoral, popliteal, and dorsalis pedis pulse. On the left side he has a palpable femoral and popliteal pulse.  I cannot palpate pedal pulses. Both feet are warm and well-perfused.  He has no significant lower extremity swelling. PULMONARY: There is good air exchange bilaterally without wheezing or rales. ABDOMEN: Soft and non-tender with normal pitched bowel sounds.  I cannot palpate his aneurysm. MUSCULOSKELETAL: There are no major deformities or cyanosis. NEUROLOGIC: No focal weakness or paresthesias are detected. SKIN: There are no ulcers or rashes noted. PSYCHIATRIC: The patient has a normal affect.  DATA:    LABS: I reviewed his labs from February of this year.  Creatinine was 1.05.  GFR was greater than 60.  His white blood cell count was 3.3.  Hemoglobin 12.4.  Platelets 203,000.  His coags were normal.  CT ABDOMEN WITH CONTRAST: I  have reviewed the CT abdomen with contrast that was done on 11/08/2017.  This was done to workup for abnormal LFTs and pancytopenia.  An incidental finding on the scan was a saccular infrarenal abdominal aortic aneurysm.  I measured the aorta above the saccular aneurysm at 20 mm.  The distal aorta measures 17 mm.  There is a 30 mm neck proximally and a 25 mm neck distally.  The maximum diameter of the aneurysm is 3.2 cm.  MEDICAL ISSUES:   SACCULAR INFRARENAL ABDOMINAL AORTIC ANEURYSM: This patient has a 3.2 cm saccular infrarenal abdominal aortic aneurysm.  Given that this aneurysm is saccular and at high risk for rupture I have recommended elective repair.  He is a good candidate for endovascular approach.  He has undergone preoperative cardiac evaluation.  We have discussed the indications for the procedure and the potential complications including but not limited to bleeding, infection, graft migration, and the need for continued follow-up.  Deitra Mayo

## 2018-01-13 NOTE — Op Note (Signed)
NAME: Corey Garcia    MRN: [id number] DOB: [birth year]    DATE OF OPERATION: 01/13/2018  PREOP DIAGNOSIS:    Saccular abdominal aortic aneurysm  POSTOP DIAGNOSIS:    Same  PROCEDURE:    Pre-closure right common femoral artery Aortogram Endovascular repair of saccular abdominal aortic aneurysm  SURGEON: Judeth Cornfield. Scot Dock, MD, FACS  ASSIST: Laurence Slate, PA  ANESTHESIA: General  EBL: Minimal  INDICATIONS:    Corey Garcia is a 72 y.o. male who had elevated liver function tests.  This prompted a CT scan and an incidental finding was a saccular abdominal aortic aneurysm.  Given the saccular nature of this aneurysm I recommended elective repair and I felt he was a reasonable candidate for endovascular approach.  He did have a small 2 cm right common iliac artery aneurysm that I thought we could follow.  FINDINGS:   Completion arteriogram showed type II endoleak with   No evidence of type I endoleak.  The graft was in excellent position.  TECHNIQUE:    The patient was taken to the operating room and received a general anesthetic.  The abdomen and both groins were prepped and draped in usual sterile fashion.  Under ultrasound guidance, a small incision was made over the common femoral artery.  This was cannulated and a Bentson wire introduced under fluoroscopic control.  The tract over the wire was dilated with an 8 [place] dilator.  The initial Perclose device was rotated 15 degrees medially.  A second Perclose device was rotated 15 degrees laterally.  An 8 French sheath was placed on the right side.  The patient was then heparinized.  I then exchanged the 8 French sheath for a 20 French sheath was advanced into the infrarenal aorta.  The  endovascular graft was selected.  This was a 26 mm x 21 mm x 10 cm device.  This was advanced through the sheath and positioned below the level of the renal arteries.  The sheath was retracted.  I then using the buddy wire technique  advanced a Bentson wire through the sheath and positioned a pigtail catheter above the renal arteries.  Aortogram was obtained to demonstrate the position of the renal arteries.  The endovascular graft was positioned below the level of the renal arteries and deployed without difficulty.  The pigtail catheter was removed over a wire.  I then used a MO balloon to angioplasty the distal graft, and the proximal graft.  The pigtail catheter was then advanced over the wire and completion arteriogram was obtained.  This showed no evidence of type I endoleak.  The graft was in excellent position.  There was a good seal proximally below the renal arteries and a good seal distally above the bifurcation.  There was some filling of the sac via to lumbar arteries.  Thus the patient had a tattoo endoleak.  The heparin was partially reversed with protamine.  The sheath was removed on the right and pressure held.  The Perclose devices were secured and there appeared to be good hemostasis.  The wire was then removed and the Perclose device is again secured for good hemostasis.  The sutures were then cut.  The incision was closed with a 4-0 Monocryl.  Sterile dressing was applied.  Patient tolerated the procedure well was transferred to the recovery room in stable condition.  All needle and sponge counts were correct.  Deitra Mayo, MD, FACS [facility] DICTATION:  01/13/2018   

## 2018-01-13 NOTE — Anesthesia Postprocedure Evaluation (Signed)
Anesthesia Post Note  Patient: Corey Garcia  Procedure(s) Performed: ABDOMINAL AORTIC ENDOVASCULAR STENT GRAFT (N/A Abdomen)     Patient location during evaluation: PACU Anesthesia Type: General Level of consciousness: awake and alert Pain management: pain level controlled Vital Signs Assessment: post-procedure vital signs reviewed and stable Respiratory status: spontaneous breathing, nonlabored ventilation and respiratory function stable Cardiovascular status: blood pressure returned to baseline and stable Postop Assessment: no apparent nausea or vomiting Anesthetic complications: no    Last Vitals:  Vitals:   01/13/18 1511 01/13/18 1526  BP: (!) 110/54 116/62  Pulse:  68  Resp: 12 (!) 9  Temp:    SpO2:  97%    Last Pain:  Vitals:   01/13/18 1526  TempSrc:   PainSc: 0-No pain                 Marguarite Markov,W. EDMOND

## 2018-01-13 NOTE — Anesthesia Preprocedure Evaluation (Signed)
Anesthesia Evaluation  Patient identified by MRN, date of birth, ID band Patient awake    Reviewed: Allergy & Precautions, H&P , NPO status , Patient's Chart, lab work & pertinent test results, reviewed documented beta blocker date and time   Airway Mallampati: III  TM Distance: >3 FB Neck ROM: Full    Dental no notable dental hx. (+) Teeth Intact, Dental Advisory Given   Pulmonary neg pulmonary ROS, former smoker,    Pulmonary exam normal breath sounds clear to auscultation       Cardiovascular hypertension, Pt. on medications and Pt. on home beta blockers + CAD, + Cardiac Stents and + Peripheral Vascular Disease  + Valvular Problems/Murmurs AS  Rhythm:Regular Rate:Normal + Systolic murmurs    Neuro/Psych negative neurological ROS  negative psych ROS   GI/Hepatic Neg liver ROS, GERD  Medicated and Controlled,  Endo/Other  negative endocrine ROS  Renal/GU negative Renal ROS  negative genitourinary   Musculoskeletal   Abdominal   Peds  Hematology negative hematology ROS (+) anemia ,   Anesthesia Other Findings   Reproductive/Obstetrics negative OB ROS                             Anesthesia Physical Anesthesia Plan  ASA: III  Anesthesia Plan: General   Post-op Pain Management:    Induction: Intravenous  PONV Risk Score and Plan: 3 and Ondansetron, Dexamethasone and Midazolam  Airway Management Planned: Oral ETT  Additional Equipment: Arterial line  Intra-op Plan:   Post-operative Plan: Extubation in OR  Informed Consent: I have reviewed the patients History and Physical, chart, labs and discussed the procedure including the risks, benefits and alternatives for the proposed anesthesia with the patient or authorized representative who has indicated his/her understanding and acceptance.   Dental advisory given  Plan Discussed with: CRNA  Anesthesia Plan Comments:          Anesthesia Quick Evaluation

## 2018-01-13 NOTE — Transfer of Care (Signed)
Immediate Anesthesia Transfer of Care Note  Patient: Corey Garcia  Procedure(s) Performed: ABDOMINAL AORTIC ENDOVASCULAR STENT GRAFT (N/A Abdomen)  Patient Location: PACU  Anesthesia Type:General  Level of Consciousness: awake, alert  and oriented  Airway & Oxygen Therapy: Patient Spontanous Breathing and Patient connected to nasal cannula oxygen  Post-op Assessment: Report given to RN and Post -op Vital signs reviewed and stable  Post vital signs: Reviewed and stable  Last Vitals:  Vitals Value Taken Time  BP 127/67 01/13/2018  2:56 PM  Temp    Pulse 69 01/13/2018  3:01 PM  Resp 9 01/13/2018  3:01 PM  SpO2 97 % 01/13/2018  3:01 PM  Vitals shown include unvalidated device data.  Last Pain:  Vitals:   01/13/18 0842  TempSrc:   PainSc: 0-No pain         Complications: No apparent anesthesia complications

## 2018-01-14 ENCOUNTER — Encounter (HOSPITAL_COMMUNITY): Payer: Self-pay | Admitting: Vascular Surgery

## 2018-01-14 LAB — BASIC METABOLIC PANEL
Anion gap: 11 (ref 5–15)
BUN: 8 mg/dL (ref 6–20)
CALCIUM: 8.4 mg/dL — AB (ref 8.9–10.3)
CHLORIDE: 105 mmol/L (ref 101–111)
CO2: 24 mmol/L (ref 22–32)
CREATININE: 1.03 mg/dL (ref 0.61–1.24)
GFR calc non Af Amer: >60 mL/min (ref >60)
GLUCOSE: 88 mg/dL (ref 65–99)
Potassium: 3.3 mmol/L — ABNORMAL LOW (ref 3.5–5.1)
Sodium: 140 mmol/L (ref 135–145)

## 2018-01-14 LAB — CBC
HEMATOCRIT: 33.9 % — AB (ref 39.0–52.0)
Hemoglobin: 11.4 g/dL — ABNORMAL LOW (ref 13.0–17.0)
MCH: 35 pg — AB (ref 26.0–34.0)
MCHC: 33.6 g/dL (ref 30.0–36.0)
MCV: 104 fL — AB (ref 78.0–100.0)
Platelets: 148 10*3/uL — ABNORMAL LOW (ref 150–400)
RBC: 3.26 MIL/uL — ABNORMAL LOW (ref 4.22–5.81)
RDW: 12.1 % (ref 11.5–15.5)
WBC: 5.6 10*3/uL (ref 4.0–10.5)

## 2018-01-14 MED ORDER — OXYCODONE HCL 5 MG PO TABS: 5.0000 mg | ORAL_TABLET | Freq: Four times a day (QID) | ORAL | 0 refills | Status: DC | PRN

## 2018-01-14 NOTE — Consult Note (Signed)
[facility] CM Primary Care Navigator  01/14/2018  Corey Garcia 10-15-1945 [id number]   Met withpatientand wife Corey Garcia) at the bedsideto identify possible discharge needs. Patientreportsthat his hematologist had sent him for CT scan, had an incidental finding of an abdominal aortic aneurysm and he was sent for vascular consultation that had led to this admission/ surgery. (underwent ABDOMINAL AORTIC ENDOVASCULAR STENT GRAFT)  Patientendorses Dr.Elizabeth Drema [place] with [place] [facility] as [place] care provider.   [facility] [place] [facility].   Patient [facility]man[facility] of "[facility]" system filled every week.  Patient statesthathe was driving prior to admission/ surgery, but wife will be providingtransportation to hisdoctors'appointments after discharge.  Patientmentionedthatgrandson Corey Garcia) liveswith him and wife. His wife and grandson will be his primary caregivers at home as stated.  Anticipateddischarge planishomeaccording to patient.  Patientand wife voiced understanding to call primary care provider's officewhen hereturnshomefor a post discharge follow-upvisitwithin1- 2weeksor sooner if needs arise.Patient letter (with PCP's contact number) was provided asareminder.   Discussed with patient and wiferegarding [facility] CM services available for health management at home but both deniedany needs or concerns at this point.  Patientand wife verbalized understandingto seekreferral from primary care provider to [facility] care management if deemednecessaryand appropriate for anyservices in the future.  [facility] care management information was provided for future needs thathe may have.  Patienthadverbally agreedand optedforEMMIcalls tofollowup hisrecoveryat home.   Referral made for  [facility] calls after discharge.   For additional questions please contact:  Edwena Felty A. Dayveon Halley, BSN, RN-BC [facility] PRIMARY CARE Navigator Cell: [phone]

## 2018-01-17 ENCOUNTER — Telehealth: Payer: Self-pay | Admitting: Cardiovascular Disease

## 2018-01-17 NOTE — Telephone Encounter (Signed)
°*  STAT* If patient is at the pharmacy, call can be transferred to refill team.   1. Which medications need to be refilled? (please list name of each medication and dose if known) Amlodipine 2.5 mg   2. Which pharmacy/location (including street and city if local pharmacy) is medication to be sent to?[facility] [address]  3. Do they need a 30 day or 90 day supply? [place]

## 2018-01-18 NOTE — Discharge Summary (Signed)
EVAR Discharge Summary   Corey Garcia 04/10/1946 72 y.o. male  MRN: [id number]  Admission Date: 01/13/2018  Discharge Date: 01/14/18  Physician: Dr. Scot Dock  Admission Diagnosis: abdominal aortic aneurysm  Discharge Day services:   Minimal soreness to R groin catheterization site  Physical Exam: Vitals:   01/14/18 0356 01/14/18 0842  BP: 139/70 (!) 161/76  Pulse: 75 75  Resp: 14 14  Temp: 99 F (37.2 C) 98.7 F (37.1 C)  SpO2: 97% 98%   General: NAD Cardiac:  RRR Lungs:  Non labored Incisions:  R groin without palpable hematoma or pseudoaneurysm Abdomen:  Soft Neurologic: A&O  Hospital Course:  The patient was admitted to the hospital and taken to the operating room on 01/13/2018 and underwent: endovascular repair of saccular abdominal aortic aneurysm by Dr. Scot Dock.  Completion arteriogram demonstrated type II endoleak.  The pt tolerated the procedure well and was transported to the PACU in good condition.   The remainder of the hospital course consisted of increasing mobilization and increasing intake of solids without difficulty.  POD#1 patient was ambulating without difficulty and feeling fit for discharge home.  He will follow up to see Dr. Scot Dock in office in about 4 weeks with CTA abd/pelvis.  Discharge instructions were reviewed with the patient and he voiced his understanding.  He was discharged in stable condition.  CBC    Component Value Date/Time   WBC 5.6 01/14/2018 0420   RBC 3.26 (L) 01/14/2018 0420   HGB 11.4 (L) 01/14/2018 0420   HGB 11.6 (L) 09/17/2017 1245   HGB 11.9 (L) 09/03/2017 1245   HCT 33.9 (L) 01/14/2018 0420   HCT 34.1 (L) 09/17/2017 1246   HCT 36.0 (L) 09/03/2017 1245   PLT 148 (L) 01/14/2018 0420   PLT 201 09/17/2017 1245   PLT 159 09/03/2017 1245   MCV 104.0 (H) 01/14/2018 0420   MCV 103.2 (H) 09/03/2017 1245   MCH 35.0 (H) 01/14/2018 0420   MCHC 33.6 01/14/2018 0420   RDW 12.1 01/14/2018 0420   RDW 11.7 09/03/2017  1245   LYMPHSABS 1.1 10/12/2017 0852   LYMPHSABS 1.4 09/03/2017 1245   MONOABS 0.5 10/12/2017 0852   MONOABS 0.7 09/03/2017 1245   EOSABS 0.2 10/12/2017 0852   EOSABS 0.1 09/03/2017 1245   BASOSABS 0.0 10/12/2017 0852   BASOSABS 0.0 09/03/2017 1245    BMET    Component Value Date/Time   NA 140 01/14/2018 0420   NA 138 06/20/2015 1303   K 3.3 (L) 01/14/2018 0420   K 4.6 06/20/2015 1303   CL 105 01/14/2018 0420   CO2 24 01/14/2018 0420   CO2 28 06/20/2015 1303   GLUCOSE 88 01/14/2018 0420   GLUCOSE 90 06/20/2015 1303   BUN 8 01/14/2018 0420   BUN 8.3 06/20/2015 1303   CREATININE 1.03 01/14/2018 0420   CREATININE 0.9 06/20/2015 1303   CALCIUM 8.4 (L) 01/14/2018 0420   CALCIUM 9.1 06/20/2015 1303   GFRNONAA >60 01/14/2018 0420   GFRAA >60 01/14/2018 0420         Discharge Diagnosis:  abdominal aortic aneurysm  Secondary Diagnosis: Patient Active Problem List   Diagnosis Date Noted  . AAA (abdominal aortic aneurysm) ([place]) 01/13/2018  . Abdominal aneurysm ([place]) 11/09/2017  . Pancytopenia, acquired ([place]) 10/29/2017  . Macrocytic anemia 09/03/2017  . Testosterone deficiency 09/03/2017  . Nonrheumatic aortic valve stenosis 07/05/2017  . Elevated liver enzymes 05/10/2015  . Preoperative cardiovascular examination 06/29/2014  . Rib pain 04/16/2014  . Liver disease  due to alcohol ([place]) 03/15/2014  . Alcoholism ([place]) 03/15/2014  . Hemochromatosis 03/15/2014  . Anemia of chronic illness 03/15/2014  . Leukopenia 03/15/2014  . Hyponatremia 03/15/2014  . Basal cell carcinoma 03/15/2014  . CAD (coronary artery disease) 01/12/2014  . Hyperlipidemia 01/12/2014  . Essential hypertension 01/12/2014  . Nocturia 01/12/2014   Past Medical History:  Diagnosis Date  . Alcoholism ([place]) 03/15/2014  . Anemia   . Bradycardia by electrocardiogram    BB decreased 11/12/11  . CAD (coronary artery disease)    last cath=01/2008, PCI  . CHF (congestive heart failure) ([place])   .  Dyslipidemia   . ED (erectile dysfunction)    viagra helps  . GERD (gastroesophageal reflux disease)   . HTN (hypertension)   . Iron overload 03/15/2014  . Liver disease due to alcohol ([place]) 03/15/2014  . Murmur, cardiac    faint early systolic grade 1/6 aortic ejection murmur  . Myocardial infarction ([place])   . Peripheral vascular disease ([place])   . Rib pain 04/16/2014  . Skin cancer    basal cell ca X 4     Allergies as of 01/14/2018      Reactions   Lisinopril Other (See Comments)   PHOTOSENSITIVITY ON ARMS   Thiazide-type Diuretics Other (See Comments)   "Lowered my blood pressure"   Codeine Nausea Only      Medication List    TAKE these medications   acetaminophen 650 MG CR tablet Commonly known as:  TYLENOL Take 1,300 mg by mouth every 8 (eight) hours.   amLODipine 2.5 MG tablet Commonly known as:  NORVASC Take 1 tablet (2.5 mg total) by mouth daily.   aspirin 81 MG tablet Take 81 mg by mouth daily.   atorvastatin 20 MG tablet Commonly known as:  LIPITOR Take 20 mg by mouth daily.   bisoprolol 10 MG tablet Commonly known as:  ZEBETA Take 15 mg by mouth daily. Take 1.5 tablets by mouth daily.   cholecalciferol 1000 units tablet Commonly known as:  VITAMIN D Take 1,000 Units by mouth daily.   CLOBETASOL PROPIONATE E 0.05 % emollient cream Generic drug:  Clobetasol Prop Emollient Base Apply 1 application topically daily as needed (dermatosis).   loratadine 10 MG tablet Commonly known as:  CLARITIN Take 1 tablet by mouth daily.   ONE DAILY MULTIVITAMIN MEN PO Take 1 tablet by mouth daily.   oxyCODONE 5 MG immediate release tablet Commonly known as:  Oxy IR/ROXICODONE Take 1 tablet (5 mg total) by mouth every 6 (six) hours as needed for moderate pain.   PROCTO-MED HC 2.5 % rectal cream Generic drug:  hydrocortisone Place 1 application rectally as needed for hemorrhoids or itching.   PROTONIX 40 MG tablet Generic drug:  pantoprazole Take 40 mg by  mouth daily.   tacrolimus 0.03 % ointment Commonly known as:  PROTOPIC Apply 1 application topically daily as needed (dermatitis on face).   tamsulosin 0.4 MG Caps capsule Commonly known as:  FLOMAX Take 0.4 mg by mouth daily.       Discharge Instructions:   [facility]  Discharge Instructions Endovascular Aortic Aneurysm Repair  Please refer to the following instructions for your post-procedure care. Your surgeon or Physician Assistant will discuss any changes with you.  Activity  You are encouraged to walk as much as you can. You can slowly return to normal activities but must avoid strenuous activity and heavy lifting until your doctor tells you it's OK. Avoid  activities such as vacuuming or swinging a gold club. It is normal to feel tired for several weeks after your surgery. Do not drive until your doctor gives the OK and you are no longer taking prescription pain medications. It is also normal to have difficulty with sleep habits, eating, and bowel movements after surgery. These will go away with time.  Bathing/Showering  You may shower after you go home. If you have an incision, do not soak in a bathtub, hot tub, or swim until the incision heals completely.  Incision Care  Shower every day. Clean your incision with mild soap and water. Pat the area dry with a clean towel. You do not need a bandage unless otherwise instructed. Do not apply any ointments or creams to your incision. If you clothing is irritating, you may cover your incision with a dry gauze pad.  Diet  Resume your normal diet. There are no special food restrictions following this procedure. A low fat/low cholesterol diet is recommended for all patients with vascular disease. In order to heal from your surgery, it is CRITICAL to get adequate nutrition. Your body requires vitamins, minerals, and protein. Vegetables are the best source of vitamins and minerals. Vegetables also  provide the perfect balance of protein. Processed food has little nutritional value, so try to avoid this.  Medications  Resume taking all of your medications unless your doctor or Physician Assistnat tells you not to. If your incision is causing pain, you may take over-the-counter pain relievers such as acetaminophen (Tylenol). If you were prescribed a stronger pain medication, please be aware these medications can cause nausea and constipation. Prevent nausea by taking the medication with a snack or meal. Avoid constipation by drinking plenty of fluids and eating foods with a high amount of fiber, such as fruits, vegetables, and grains. Do not take Tylenol if you are taking prescription pain medications.   Follow up  [facility] office will schedule a follow-up appointment with a C.T. scan 3-4 weeks after your surgery.  Please call us immediately for any of the following conditions  Severe or worsening pain in your legs or feet or in your abdomen back or chest. Increased pain, redness, drainage (pus) from your incision sit. Increased abdominal pain, bloating, nausea, vomiting or persistent diarrhea. Fever of 101 degrees or higher. Swelling in your leg (s),  Reduce your risk of vascular disease  .Stop smoking. If you would like help call [facility] at 1-800-QUIT-NOW 218-788-6660) or [place] at (872)775-5229. .Manage your cholesterol .Maintain a desired weight .Control your diabetes .Keep your blood pressure down  If you have questions, please call the office at [phone].   Disposition: home  Patient's condition: is Good  Follow up: 1. Dr. Scot Dock in 4 weeks with CTA protocol   Dagoberto Ligas, PA-C [facility] [phone] 01/18/2018  2:23 PM   - For VQI Registry use - Post-op:  Time to Extubation: [x]  In OR, [ ]  < 12 hrs, [ ]  12-24 hrs, [ ]  >=24 hrs Vasopressors Req. Post-op: No MI: No., [ ]  Troponin only, [ ]  EKG or Clinical New Arrhythmia:  No CHF: No ICU Stay: none Transfusion: No     If yes,  units given  Complications: Resp failure: No., [ ]  Pneumonia, [ ]  Ventilator Chg in renal function: No., [ ]  Inc. Cr > 0.5, [ ]  Temp. Dialysis,  [ ]  Permanent dialysis Leg ischemia: No., no Surgery needed, [ ]  Yes, Surgery needed,  [ ]  Amputation Bowel ischemia:  No., [ ]  Medical Rx, [ ]  Surgical Rx Wound complication: No., [ ]  Superficial separation/infection, [ ]  Return to OR Return to OR: No  Return to OR for bleeding: No Stroke: No., [ ]  Minor, [ ]  Major  Discharge medications: Statin use:  Yes  ASA use:  Yes  Plavix use:  No  Beta blocker use:  No  ARB use:  No ACEI use:  No CCB use:  Yes

## 2018-01-21 ENCOUNTER — Other Ambulatory Visit: Payer: Self-pay

## 2018-01-21 DIAGNOSIS — Z9889 Other specified postprocedural states: Secondary | ICD-10-CM | POA: Diagnosis not present

## 2018-01-21 DIAGNOSIS — I714 Abdominal aortic aneurysm, without rupture, unspecified: Secondary | ICD-10-CM

## 2018-01-21 DIAGNOSIS — D649 Anemia, unspecified: Secondary | ICD-10-CM | POA: Diagnosis not present

## 2018-01-21 DIAGNOSIS — E878 Other disorders of electrolyte and fluid balance, not elsewhere classified: Secondary | ICD-10-CM | POA: Diagnosis not present

## 2018-01-25 ENCOUNTER — Telehealth: Payer: Self-pay

## 2018-01-25 MED ORDER — AMLODIPINE BESYLATE 2.5 MG PO TABS: 2.5000 mg | ORAL_TABLET | Freq: Every day | ORAL | 3 refills | Status: DC

## 2018-01-25 NOTE — Telephone Encounter (Signed)
Patient walked in office requesting 90 day refill for amlodipine 2.5 mg.Refill sent to pharmacy.

## 2018-02-08 DIAGNOSIS — L570 Actinic keratosis: Secondary | ICD-10-CM | POA: Diagnosis not present

## 2018-02-08 DIAGNOSIS — L739 Follicular disorder, unspecified: Secondary | ICD-10-CM | POA: Diagnosis not present

## 2018-02-10 DIAGNOSIS — M1712 Unilateral primary osteoarthritis, left knee: Secondary | ICD-10-CM | POA: Diagnosis not present

## 2018-02-16 ENCOUNTER — Encounter: Payer: PPO | Admitting: Vascular Surgery

## 2018-02-22 ENCOUNTER — Ambulatory Visit
Admission: RE | Admit: 2018-02-22 | Discharge: 2018-02-22 | Disposition: A | Discharge status: Home / Self Care | Payer: PPO | Source: Ambulatory Visit | Attending: Vascular Surgery | Admitting: Vascular Surgery

## 2018-02-22 DIAGNOSIS — I714 Abdominal aortic aneurysm, without rupture, unspecified: Secondary | ICD-10-CM

## 2018-02-22 MED ORDER — IOPAMIDOL (ISOVUE-370) INJECTION 76%
75.0000 mL | Freq: Once | INTRAVENOUS | Status: AC | PRN
  Administered 2018-02-22: 75 mL via INTRAVENOUS

## 2018-02-23 ENCOUNTER — Encounter: Payer: PPO | Admitting: Vascular Surgery

## 2018-02-23 DIAGNOSIS — H35033 Hypertensive retinopathy, bilateral: Secondary | ICD-10-CM | POA: Diagnosis not present

## 2018-02-23 DIAGNOSIS — H43811 Vitreous degeneration, right eye: Secondary | ICD-10-CM | POA: Diagnosis not present

## 2018-02-23 DIAGNOSIS — Z961 Presence of intraocular lens: Secondary | ICD-10-CM | POA: Diagnosis not present

## 2018-02-23 DIAGNOSIS — H5319 Other subjective visual disturbances: Secondary | ICD-10-CM | POA: Diagnosis not present

## 2018-02-23 DIAGNOSIS — H3562 Retinal hemorrhage, left eye: Secondary | ICD-10-CM | POA: Diagnosis not present

## 2018-02-28 ENCOUNTER — Ambulatory Visit (INDEPENDENT_AMBULATORY_CARE_PROVIDER_SITE_OTHER): Payer: Self-pay | Admitting: Vascular Surgery

## 2018-02-28 ENCOUNTER — Encounter: Payer: Self-pay | Admitting: Vascular Surgery

## 2018-02-28 ENCOUNTER — Other Ambulatory Visit: Payer: Self-pay

## 2018-02-28 VITALS — BP 153/83 | HR 66 | Temp 97.9°F | Resp 18 | Ht 64.0 in | Wt 152.0 lb

## 2018-02-28 DIAGNOSIS — Z48812 Encounter for surgical aftercare following surgery on the circulatory system: Secondary | ICD-10-CM

## 2018-02-28 NOTE — Progress Notes (Signed)
Patient name: Corey Garcia MRN: [id number] DOB: [birth year] Sex: male  REASON FOR VISIT:   Follow-up after endovascular repair of saccular abdominal aortic aneurysm.   HPI:   Corey Garcia is a pleasant 72 y.o. male who had elevated liver function tests.  This prompted a CT scan and an incidental finding was a saccular abdominal aortic aneurysm.  I felt this could be addressed with an endovascular approach.  He also had a small 2.5 cm right common iliac artery aneurysm which I thought we could follow.  He underwent endovascular repair of his aneurysm on 01/13/2018.  He returns for a one-month follow-up visit.  He does have some chronic upper back pain which is not new.  He denies any abdominal pain.  He is not a smoker.  He is on aspirin and is on a statin.  Current Outpatient Medications  Medication Sig Dispense Refill  . acetaminophen (TYLENOL) 650 MG CR tablet Take 1,300 mg by mouth every 8 (eight) hours.    Marland Kitchen amLODipine (NORVASC) 2.5 MG tablet Take 1 tablet (2.5 mg total) by mouth daily. 90 tablet 3  . aspirin 81 MG tablet Take 81 mg by mouth daily.    Marland Kitchen atorvastatin (LIPITOR) 20 MG tablet Take 20 mg by mouth daily.    . bisoprolol (ZEBETA) 10 MG tablet Take 15 mg by mouth daily. Take 1.5 tablets by mouth daily.    . cholecalciferol (VITAMIN D) 1000 units tablet Take 1,000 Units by mouth daily.    . Clobetasol Prop Emollient Base (CLOBETASOL PROPIONATE E) 0.05 % emollient cream Apply 1 application topically daily as needed (dermatosis).     . hydrocortisone (PROCTO-MED HC) 2.5 % rectal cream Place 1 application rectally as needed for hemorrhoids or itching.    . loratadine (CLARITIN) 10 MG tablet Take 1 tablet by mouth daily.    . minocycline (MINOCIN,DYNACIN) 50 MG capsule TK 1 C PO BID  2  . Multiple Vitamins-Minerals (ONE DAILY MULTIVITAMIN MEN PO) Take 1 tablet by mouth daily.     . pantoprazole (PROTONIX) 40 MG tablet Take 40 mg by mouth daily.     . tacrolimus (PROTOPIC)  0.03 % ointment Apply 1 application topically daily as needed (dermatitis on face).     . tamsulosin (FLOMAX) 0.4 MG CAPS capsule Take 0.4 mg by mouth daily.    Marland Kitchen oxyCODONE (OXY IR/ROXICODONE) 5 MG immediate release tablet Take 1 tablet (5 mg total) by mouth every 6 (six) hours as needed for moderate pain. (Patient not taking: Reported on 02/28/2018) 8 tablet 0   No current facility-administered medications for this visit.     REVIEW OF SYSTEMS:  [X]  denotes positive finding, [ ]  denotes negative finding Cardiac  Comments:  Chest pain or chest pressure:    Shortness of breath upon exertion:    Short of breath when lying flat:    Irregular heart rhythm:    Constitutional    Fever or chills:     PHYSICAL EXAM:   Vitals:   02/28/18 1030 02/28/18 1035  BP: (!) 158/81 (!) 153/83  Pulse: 66 66  Resp: 18   Temp: 97.9 F (36.6 C)   TempSrc: Oral   SpO2: 99%   Weight: 152 lb (68.9 kg)   Height: 5\' 4"  (1.626 m)     GENERAL: The patient is a well-nourished male, in no acute distress. The vital signs are documented above. CARDIOVASCULAR: There is a regular rate and rhythm. PULMONARY: There is good air exchange  bilaterally without wheezing or rales. VASCULAR: I do not detect carotid bruits. He has palpable femoral pulses. Both feet are warm and well-perfused. His groin sites look fine.  DATA:   CT ANGIOGRAM: I reviewed the CT angiogram.  His stent is in good position.  The maximum diameter of his saccular infrarenal aneurysm is 3.4 cm which has not changed in size.  He does have a type II endoleak likely secondary to the inferior mesenteric artery and multiple lumbar arteries.  He does have a small 2.5 cm right common iliac artery aneurysm which is stable in size.  MEDICAL ISSUES:   STATUS POST ENDOVASCULAR ANEURYSM REPAIR OF SACCULAR ANEURYSM: The patient is doing well status post endovascular repair of his saccular infrarenal abdominal aortic aneurysm.  He does have a small right  common iliac artery aneurysm (2.5 cm) that we will continue to follow.  I have ordered a follow-up ultrasound in 6 months and I will see him back at that time.  He knows to call sooner if he has problems.  Fortunately he is not a smoker.  He is on aspirin and is on a statin.  Deitra Mayo [facility] [phone]

## 2018-02-28 NOTE — Progress Notes (Signed)
Vitals:   02/28/18 1030  BP: (!) 158/81  Pulse: 66  Resp: 18  Temp: 97.9 F (36.6 C)  TempSrc: Oral  SpO2: 99%  Weight: 152 lb (68.9 kg)  Height: 5\' 4"  (1.626 m)

## 2018-03-01 DIAGNOSIS — M1712 Unilateral primary osteoarthritis, left knee: Secondary | ICD-10-CM | POA: Diagnosis not present

## 2018-03-07 ENCOUNTER — Other Ambulatory Visit: Payer: Self-pay

## 2018-03-07 DIAGNOSIS — I714 Abdominal aortic aneurysm, without rupture, unspecified: Secondary | ICD-10-CM

## 2018-03-07 DIAGNOSIS — Z48812 Encounter for surgical aftercare following surgery on the circulatory system: Secondary | ICD-10-CM

## 2018-03-08 DIAGNOSIS — M1712 Unilateral primary osteoarthritis, left knee: Secondary | ICD-10-CM | POA: Diagnosis not present

## 2018-03-10 DIAGNOSIS — T1490XA Injury, unspecified, initial encounter: Secondary | ICD-10-CM | POA: Diagnosis not present

## 2018-03-15 DIAGNOSIS — M1712 Unilateral primary osteoarthritis, left knee: Secondary | ICD-10-CM | POA: Diagnosis not present

## 2018-03-18 DIAGNOSIS — M1712 Unilateral primary osteoarthritis, left knee: Secondary | ICD-10-CM | POA: Diagnosis not present

## 2018-03-18 DIAGNOSIS — M549 Dorsalgia, unspecified: Secondary | ICD-10-CM | POA: Diagnosis not present

## 2018-03-22 DIAGNOSIS — M1712 Unilateral primary osteoarthritis, left knee: Secondary | ICD-10-CM | POA: Diagnosis not present

## 2018-03-30 DIAGNOSIS — M1712 Unilateral primary osteoarthritis, left knee: Secondary | ICD-10-CM | POA: Diagnosis not present

## 2018-04-12 DIAGNOSIS — L309 Dermatitis, unspecified: Secondary | ICD-10-CM | POA: Diagnosis not present

## 2018-04-12 DIAGNOSIS — L719 Rosacea, unspecified: Secondary | ICD-10-CM | POA: Diagnosis not present

## 2018-04-12 DIAGNOSIS — L570 Actinic keratosis: Secondary | ICD-10-CM | POA: Diagnosis not present

## 2018-04-27 DIAGNOSIS — M1712 Unilateral primary osteoarthritis, left knee: Secondary | ICD-10-CM | POA: Diagnosis not present

## 2018-05-03 ENCOUNTER — Telehealth: Payer: Self-pay | Admitting: *Deleted

## 2018-05-03 ENCOUNTER — Ambulatory Visit: Payer: PPO | Admitting: Hematology and Oncology

## 2018-05-03 ENCOUNTER — Other Ambulatory Visit: Payer: PPO

## 2018-05-03 NOTE — Telephone Encounter (Signed)
-----   Message from [name], MD sent at 05/03/2018  3:16 PM EDT ----- On my previous notes in March, we discussed follow-up and he said he will follow with PCP for blood count monitoring (it was documented) That's why I cancelled his appt ----- Message ----- From: Patton Salles, RN Sent: 05/03/2018   2:31 PM EDT To: [name], MD, Modena Morrow    ----- Message ----- From: Modena Morrow Sent: 05/03/2018   1:52 PM EDT To: Patton Salles, RN  Patient came in today thinking he was to have labs and see you. Appointments were cancelled.  He was curious as to why.  Can you please advise patient?  Thanks [employer]

## 2018-05-03 NOTE — Telephone Encounter (Signed)
Notified of message below

## 2018-05-04 ENCOUNTER — Telehealth: Payer: Self-pay | Admitting: Hematology and Oncology

## 2018-05-04 NOTE — Telephone Encounter (Signed)
LMVM for patient with info as to why appts were cancelled per 9/3 staff message

## 2018-05-05 DIAGNOSIS — N3001 Acute cystitis with hematuria: Secondary | ICD-10-CM | POA: Diagnosis not present

## 2018-05-05 DIAGNOSIS — R350 Frequency of micturition: Secondary | ICD-10-CM | POA: Diagnosis not present

## 2018-05-05 DIAGNOSIS — R52 Pain, unspecified: Secondary | ICD-10-CM | POA: Diagnosis not present

## 2018-05-10 ENCOUNTER — Emergency Department (HOSPITAL_COMMUNITY): Payer: PPO

## 2018-05-10 ENCOUNTER — Encounter (HOSPITAL_COMMUNITY): Payer: Self-pay | Admitting: Emergency Medicine

## 2018-05-10 ENCOUNTER — Inpatient Hospital Stay (HOSPITAL_COMMUNITY)
Admission: EM | Admit: 2018-05-10 | Discharge: 2018-05-12 | DRG: 640 | Disposition: A | Discharge status: Home / Self Care | Payer: PPO | Source: Ambulatory Visit | Attending: Internal Medicine | Admitting: Internal Medicine

## 2018-05-10 DIAGNOSIS — N400 Enlarged prostate without lower urinary tract symptoms: Secondary | ICD-10-CM | POA: Diagnosis present

## 2018-05-10 DIAGNOSIS — D638 Anemia in other chronic diseases classified elsewhere: Secondary | ICD-10-CM | POA: Diagnosis present

## 2018-05-10 DIAGNOSIS — N390 Urinary tract infection, site not specified: Secondary | ICD-10-CM | POA: Diagnosis not present

## 2018-05-10 DIAGNOSIS — E876 Hypokalemia: Secondary | ICD-10-CM | POA: Diagnosis present

## 2018-05-10 DIAGNOSIS — I110 Hypertensive heart disease with heart failure: Secondary | ICD-10-CM | POA: Diagnosis present

## 2018-05-10 DIAGNOSIS — K760 Fatty (change of) liver, not elsewhere classified: Secondary | ICD-10-CM | POA: Diagnosis present

## 2018-05-10 DIAGNOSIS — R443 Hallucinations, unspecified: Secondary | ICD-10-CM | POA: Diagnosis present

## 2018-05-10 DIAGNOSIS — Z7982 Long term (current) use of aspirin: Secondary | ICD-10-CM

## 2018-05-10 DIAGNOSIS — E871 Hypo-osmolality and hyponatremia: Principal | ICD-10-CM | POA: Diagnosis present

## 2018-05-10 DIAGNOSIS — F1010 Alcohol abuse, uncomplicated: Secondary | ICD-10-CM | POA: Diagnosis not present

## 2018-05-10 DIAGNOSIS — E785 Hyperlipidemia, unspecified: Secondary | ICD-10-CM | POA: Diagnosis present

## 2018-05-10 DIAGNOSIS — J9811 Atelectasis: Secondary | ICD-10-CM | POA: Diagnosis not present

## 2018-05-10 DIAGNOSIS — I2510 Atherosclerotic heart disease of native coronary artery without angina pectoris: Secondary | ICD-10-CM | POA: Diagnosis not present

## 2018-05-10 DIAGNOSIS — K219 Gastro-esophageal reflux disease without esophagitis: Secondary | ICD-10-CM | POA: Diagnosis present

## 2018-05-10 DIAGNOSIS — R21 Rash and other nonspecific skin eruption: Secondary | ICD-10-CM | POA: Diagnosis not present

## 2018-05-10 DIAGNOSIS — Z862 Personal history of diseases of the blood and blood-forming organs and certain disorders involving the immune mechanism: Secondary | ICD-10-CM | POA: Diagnosis not present

## 2018-05-10 DIAGNOSIS — R51 Headache: Secondary | ICD-10-CM | POA: Diagnosis not present

## 2018-05-10 DIAGNOSIS — R52 Pain, unspecified: Secondary | ICD-10-CM

## 2018-05-10 DIAGNOSIS — G0490 Encephalitis and encephalomyelitis, unspecified: Secondary | ICD-10-CM

## 2018-05-10 DIAGNOSIS — Z87891 Personal history of nicotine dependence: Secondary | ICD-10-CM

## 2018-05-10 DIAGNOSIS — G8929 Other chronic pain: Secondary | ICD-10-CM | POA: Diagnosis not present

## 2018-05-10 DIAGNOSIS — K7460 Unspecified cirrhosis of liver: Secondary | ICD-10-CM | POA: Diagnosis not present

## 2018-05-10 DIAGNOSIS — R1011 Right upper quadrant pain: Secondary | ICD-10-CM | POA: Diagnosis not present

## 2018-05-10 DIAGNOSIS — I509 Heart failure, unspecified: Secondary | ICD-10-CM | POA: Diagnosis not present

## 2018-05-10 DIAGNOSIS — F1020 Alcohol dependence, uncomplicated: Secondary | ICD-10-CM | POA: Diagnosis not present

## 2018-05-10 DIAGNOSIS — R109 Unspecified abdominal pain: Secondary | ICD-10-CM | POA: Diagnosis not present

## 2018-05-10 DIAGNOSIS — I10 Essential (primary) hypertension: Secondary | ICD-10-CM | POA: Diagnosis present

## 2018-05-10 DIAGNOSIS — R441 Visual hallucinations: Secondary | ICD-10-CM | POA: Diagnosis not present

## 2018-05-10 DIAGNOSIS — Z8249 Family history of ischemic heart disease and other diseases of the circulatory system: Secondary | ICD-10-CM | POA: Diagnosis not present

## 2018-05-10 DIAGNOSIS — G9341 Metabolic encephalopathy: Secondary | ICD-10-CM | POA: Diagnosis present

## 2018-05-10 DIAGNOSIS — M545 Low back pain: Secondary | ICD-10-CM | POA: Diagnosis not present

## 2018-05-10 DIAGNOSIS — R4189 Other symptoms and signs involving cognitive functions and awareness: Secondary | ICD-10-CM | POA: Diagnosis not present

## 2018-05-10 HISTORY — DX: Unspecified osteoarthritis, unspecified site: M19.90

## 2018-05-10 LAB — CBC WITH DIFFERENTIAL/PLATELET
BASOS PCT: 0 %
Basophils Absolute: 0 10*3/uL (ref 0.0–0.1)
Eosinophils Absolute: 0.2 10*3/uL (ref 0.0–0.7)
Eosinophils Relative: 3 %
HEMATOCRIT: 31.2 % — AB (ref 39.0–52.0)
Hemoglobin: 10.7 g/dL — ABNORMAL LOW (ref 13.0–17.0)
LYMPHS ABS: 0.7 10*3/uL (ref 0.7–4.0)
LYMPHS PCT: 13 %
MCH: 33.6 pg (ref 26.0–34.0)
MCHC: 34.3 g/dL (ref 30.0–36.0)
MCV: 98.1 fL (ref 78.0–100.0)
MONO ABS: 0.7 10*3/uL (ref 0.1–1.0)
MONOS PCT: 13 %
NEUTROS ABS: 3.9 10*3/uL (ref 1.7–7.7)
NEUTROS PCT: 71 %
Platelets: 228 10*3/uL (ref 150–400)
RBC: 3.18 MIL/uL — ABNORMAL LOW (ref 4.22–5.81)
RDW: 11.8 % (ref 11.5–15.5)
WBC: 5.4 10*3/uL (ref 4.0–10.5)

## 2018-05-10 LAB — URINALYSIS, ROUTINE W REFLEX MICROSCOPIC
BILIRUBIN URINE: NEGATIVE
GLUCOSE, UA: NEGATIVE mg/dL
Hgb urine dipstick: NEGATIVE
KETONES UR: 20 mg/dL — AB
Leukocytes, UA: NEGATIVE
NITRITE: NEGATIVE
PH: 6 (ref 5.0–8.0)
Protein, ur: NEGATIVE mg/dL
SPECIFIC GRAVITY, URINE: 1.006 (ref 1.005–1.030)

## 2018-05-10 LAB — I-STAT CHEM 8, ED
BUN: 10 mg/dL (ref 8–23)
CHLORIDE: 91 mmol/L — AB (ref 98–111)
CREATININE: 1 mg/dL (ref 0.61–1.24)
Calcium, Ion: 1.13 mmol/L — ABNORMAL LOW (ref 1.15–1.40)
Glucose, Bld: 71 mg/dL (ref 70–99)
HEMATOCRIT: 33 % — AB (ref 39.0–52.0)
Hemoglobin: 11.2 g/dL — ABNORMAL LOW (ref 13.0–17.0)
POTASSIUM: 3.5 mmol/L (ref 3.5–5.1)
SODIUM: 123 mmol/L — AB (ref 135–145)
TCO2: 19 mmol/L — ABNORMAL LOW (ref 22–32)

## 2018-05-10 LAB — COMPREHENSIVE METABOLIC PANEL
ALBUMIN: 3.5 g/dL (ref 3.5–5.0)
ALT: 28 U/L (ref 0–44)
AST: 40 U/L (ref 15–41)
Alkaline Phosphatase: 92 U/L (ref 38–126)
Anion gap: 15 (ref 5–15)
BUN: 11 mg/dL (ref 8–23)
CHLORIDE: 92 mmol/L — AB (ref 98–111)
CO2: 20 mmol/L — AB (ref 22–32)
CREATININE: 1.12 mg/dL (ref 0.61–1.24)
Calcium: 8.9 mg/dL (ref 8.9–10.3)
GFR calc Af Amer: >60 mL/min (ref >60)
GLUCOSE: 74 mg/dL (ref 70–99)
POTASSIUM: 3.4 mmol/L — AB (ref 3.5–5.1)
Sodium: 127 mmol/L — ABNORMAL LOW (ref 135–145)
Total Bilirubin: 0.9 mg/dL (ref 0.3–1.2)
Total Protein: 7.4 g/dL (ref 6.5–8.1)

## 2018-05-10 LAB — ETHANOL: Alcohol, Ethyl (B): <10 mg/dL (ref <10)

## 2018-05-10 LAB — AMMONIA: Ammonia: 16 umol/L (ref 9–35)

## 2018-05-10 LAB — I-STAT CG4 LACTIC ACID, ED: LACTIC ACID, VENOUS: 0.86 mmol/L (ref 0.5–1.9)

## 2018-05-10 MED ORDER — ONDANSETRON HCL 4 MG/2ML IJ SOLN: 4.0000 mg | Freq: Once | INTRAMUSCULAR | Status: DC

## 2018-05-10 MED ORDER — SODIUM CHLORIDE 0.9 % IV BOLUS
1000.0000 mL | Freq: Once | INTRAVENOUS | Status: AC
  Administered 2018-05-10: 1000 mL via INTRAVENOUS

## 2018-05-10 NOTE — ED Triage Notes (Signed)
Pt sent over by [facility] for hallucinations and headache. Pt had headache for about 9 days. Hallucinations of seeing things moving around that aren't real like thinking it was raining in doctor's office today.  Pt also c/o bilat flank pain that has been intermittent for while.

## 2018-05-10 NOTE — ED Notes (Signed)
Bed: WA14 Expected date:  Expected time:  Means of arrival:  Comments: For res. B 

## 2018-05-10 NOTE — ED Notes (Signed)
Pt says he is still having hallucinations. "look at the tv, its doing it now, its wobbling right to left and up and down."

## 2018-05-10 NOTE — ED Notes (Signed)
Bed: WLPT4 Expected date:  Expected time:  Means of arrival:  Comments: 

## 2018-05-10 NOTE — ED Provider Notes (Signed)
[facility] DEPT Provider Note   CSN: [id number] Arrival date & time: 05/10/18  1650     History   Chief Complaint Chief Complaint  Patient presents with  . Hallucinations  . Headache    HPI Corey Garcia is a 72 y.o. male.  72 yo M with a chief complaint of altered mental status.  Going on for the past week.  The patient was seen by his primary care physician and was started on a medicine for a urinary tract infection.  He is not sure exactly what it was.  He has been having fevers off and on though sounds like the highest temp at home was 99.4.  He has had a couple episodes where he felt nauseated and tried to vomit.  He denies chest pain.  He feels that he is having trouble with constipation and then took laxatives and is having diarrhea.  He denies dark or bloody stool.  He now feels that his abdomen is distended and somewhat painful to the sides of the abdomen.  He has chronic low back pain and feels that is an ongoing issue for him.  Describes the headache is all over.  Seems to come and go.  Saw his primary care physician today who sent him here for evaluation.  The history is provided by the patient.  Headache   This is a new problem. The current episode started less than 1 hour ago. The problem occurs constantly. The problem has not changed since onset.The headache is associated with nothing. The pain is at a severity of 8/10. The pain is moderate. The pain does not radiate. Pertinent negatives include no fever, no palpitations, no shortness of breath and no vomiting. He has tried nothing for the symptoms. The treatment provided no relief.    Past Medical History:  Diagnosis Date  . Alcoholism ([place]) 03/15/2014  . Anemia   . Bradycardia by electrocardiogram    BB decreased 11/12/11  . CAD (coronary artery disease)    last cath=01/2008, PCI  . CHF (congestive heart failure) ([place])   . Dyslipidemia   . ED (erectile dysfunction)    viagra helps   . GERD (gastroesophageal reflux disease)   . HTN (hypertension)   . Iron overload 03/15/2014  . Liver disease due to alcohol ([place]) 03/15/2014  . Murmur, cardiac    faint early systolic grade 1/6 aortic ejection murmur  . Myocardial infarction ([place])   . Peripheral vascular disease ([place])   . Rib pain 04/16/2014  . Skin cancer    basal cell ca X 4    Patient Active Problem List   Diagnosis Date Noted  . AAA (abdominal aortic aneurysm) ([place]) 01/13/2018  . Abdominal aneurysm ([place]) 11/09/2017  . Pancytopenia, acquired ([place]) 10/29/2017  . Macrocytic anemia 09/03/2017  . Testosterone deficiency 09/03/2017  . Nonrheumatic aortic valve stenosis 07/05/2017  . Elevated liver enzymes 05/10/2015  . Preoperative cardiovascular examination 06/29/2014  . Rib pain 04/16/2014  . Liver disease due to alcohol ([place]) 03/15/2014  . Alcoholism ([place]) 03/15/2014  . Hemochromatosis 03/15/2014  . Anemia of chronic illness 03/15/2014  . Leukopenia 03/15/2014  . Hyponatremia 03/15/2014  . Basal cell carcinoma 03/15/2014  . CAD (coronary artery disease) 01/12/2014  . Hyperlipidemia 01/12/2014  . Essential hypertension 01/12/2014  . Nocturia 01/12/2014    Past Surgical History:  Procedure Laterality Date  . ABDOMINAL AORTIC ENDOVASCULAR STENT GRAFT N/A 01/13/2018   Procedure: ABDOMINAL AORTIC ENDOVASCULAR STENT GRAFT;  Surgeon: Deitra Mayo  S, MD;  Location: [facility];  Service: Vascular;  Laterality: N/A;  . CORONARY ANGIOPLASTY WITH STENT PLACEMENT  01/31/2008   PCI with cutting balloon arthrectomy of the ostium of the circ obtuse marg 2 vessel  . CORONARY ANGIOPLASTY WITH STENT PLACEMENT  01/27/08   nl LV function, PTCA/stenting of LAD with 3x63mm Cypher post dilated 3.79mm  . HERNIA REPAIR    . KNEE ARTHROSCOPY Left   . MOHS SURGERY     x4  . NASAL SEPTUM SURGERY          Home Medications    Prior to Admission medications   Medication Sig Start Date End Date Taking? Authorizing Provider    acetaminophen (TYLENOL) 650 MG CR tablet Take 1,300 mg by mouth every 8 (eight) hours.   Yes [provider]  amLODipine (NORVASC) 2.5 MG tablet Take 1 tablet (2.5 mg total) by mouth daily. 01/25/18 05/10/18 Yes Croitoru, Mihai, MD  amoxicillin-clavulanate (AUGMENTIN) 875-125 MG tablet Take 1 tablet by mouth every 12 (twelve) hours. 05/05/18  Yes [provider]  aspirin 81 MG tablet Take 81 mg by mouth daily.   Yes [provider]  atorvastatin (LIPITOR) 20 MG tablet Take 20 mg by mouth daily.   Yes [provider]  bisoprolol (ZEBETA) 10 MG tablet Take 15 mg by mouth daily. Take 1.5 tablets by mouth daily. 08/22/16  Yes [provider]  cholecalciferol (VITAMIN D) 1000 units tablet Take 1,000 Units by mouth daily.   Yes [provider]  hydrocortisone (PROCTO-MED HC) 2.5 % rectal cream Place 1 application rectally as needed for hemorrhoids or itching.   Yes [provider]  loratadine (CLARITIN) 10 MG tablet Take 1 tablet by mouth daily.   Yes [provider]  Multiple Vitamins-Minerals (ONE DAILY MULTIVITAMIN MEN PO) Take 1 tablet by mouth daily.    Yes [provider]  pantoprazole (PROTONIX) 40 MG tablet Take 40 mg by mouth daily.    Yes [provider]  Probiotic Product (PROBIOTIC PO) Take 1 tablet by mouth daily.   Yes [provider]  sulfamethoxazole-trimethoprim (BACTRIM DS,SEPTRA DS) 800-160 MG tablet Take 1 tablet by mouth 2 (two) times daily. 05/05/18  Yes [provider]  tacrolimus (PROTOPIC) 0.03 % ointment Apply 1 application topically daily as needed (dermatitis on face).    Yes [provider]  tamsulosin (FLOMAX) 0.4 MG CAPS capsule Take 0.4 mg by mouth daily.   Yes [provider]  oxyCODONE (OXY IR/ROXICODONE) 5 MG immediate release tablet Take 1 tablet (5 mg total) by mouth every 6 (six) hours as needed for moderate pain. Patient not taking: Reported on  05/10/2018 01/14/18   Dagoberto Ligas, PA-C    Family History Family History  Problem Relation Age of Onset  . Heart failure Mother        heart disease  . Heart disease Mother   . Heart failure Father        heart disease  . Heart disease Father   . Heart disease Brother   . Heart failure Brother        "bad heart valve"    Social History Social History   Tobacco Use  . Smoking status: Former Smoker    Last attempt to quit: 10/31/1982    Years since quitting: 35.5  . Smokeless tobacco: Never Used  Substance Use Topics  . Alcohol use: Yes    Alcohol/week: 28.0 standard drinks    Types: 28 Cans of beer per  week    Comment: "3-4 beers or hard apple ciders/ day" 01/10/2018  . Drug use: No     Allergies   Lisinopril; Minocin [minocycline hcl]; Thiazide-type diuretics; and Codeine   Review of Systems Review of Systems  Constitutional: Positive for activity change. Negative for chills and fever.  HENT: Negative for congestion and facial swelling.   Eyes: Negative for discharge and visual disturbance.  Respiratory: Negative for shortness of breath.   Cardiovascular: Negative for chest pain and palpitations.  Gastrointestinal: Positive for abdominal distention and abdominal pain. Negative for diarrhea and vomiting.  Musculoskeletal: Positive for back pain. Negative for arthralgias and myalgias.  Skin: Negative for color change and rash.  Neurological: Positive for headaches. Negative for tremors and syncope.  Psychiatric/Behavioral: Negative for confusion and dysphoric mood.     Physical Exam Updated Vital Signs BP 139/75   Pulse 80   Temp 98.6 F (37 C) (Oral)   Resp 14   SpO2 100%   Physical Exam  Constitutional: He is oriented to person, place, and time. He appears well-developed and well-nourished.  HENT:  Head: Normocephalic and atraumatic.  Eyes: Pupils are equal, round, and reactive to light. EOM are normal.  Neck: Normal range of motion. Neck supple. No  JVD present.  Cardiovascular: Normal rate and regular rhythm. Exam reveals no gallop and no friction rub.  No murmur heard. Pulmonary/Chest: No respiratory distress. He has no wheezes.  Abdominal: He exhibits no distension. There is no rebound and no guarding.  Musculoskeletal: Normal range of motion. He exhibits tenderness.  About the midline low back about L4-5-s1  Neurological: He is alert and oriented to person, place, and time.  Skin: No rash noted. No pallor.  Psychiatric: He has a normal mood and affect. His behavior is normal.  Nursing note and vitals reviewed.    ED Treatments / Results  Labs (all labs ordered are listed, but only abnormal results are displayed) Labs Reviewed  COMPREHENSIVE METABOLIC PANEL - Abnormal; Notable for the following components:      Result Value   Sodium 127 (*)    Potassium 3.4 (*)    Chloride 92 (*)    CO2 20 (*)    All other components within normal limits  CBC WITH DIFFERENTIAL/PLATELET - Abnormal; Notable for the following components:   RBC 3.18 (*)    Hemoglobin 10.7 (*)    HCT 31.2 (*)    All other components within normal limits  URINALYSIS, ROUTINE W REFLEX MICROSCOPIC - Abnormal; Notable for the following components:   Ketones, ur 20 (*)    All other components within normal limits  I-STAT CHEM 8, ED - Abnormal; Notable for the following components:   Sodium 123 (*)    Chloride 91 (*)    Calcium, Ion 1.13 (*)    TCO2 19 (*)    Hemoglobin 11.2 (*)    HCT 33.0 (*)    All other components within normal limits  CULTURE, BLOOD (ROUTINE X 2)  CULTURE, BLOOD (ROUTINE X 2)  AMMONIA  ETHANOL  RAPID URINE DRUG SCREEN, HOSP PERFORMED  I-STAT CG4 LACTIC ACID, ED    EKG EKG Interpretation  Date/Time:  Tuesday May 10 2018 18:01:13 EDT Ventricular Rate:  84 PR Interval:    QRS Duration: 88 QT Interval:  345 QTC Calculation: 408 R Axis:   10 Text Interpretation:  Sinus rhythm Low voltage, precordial leads Probable  anteroseptal infarct, old No significant change since last tracing Confirmed by Deno Etienne [phone])  on 05/10/2018 6:03:23 PM   Radiology Dg Chest 2 View  Result Date: 05/10/2018 CLINICAL DATA:  Initial evaluation for acute altered mental status. EXAM: CHEST - 2 VIEW COMPARISON:  Prior radiograph from 01/13/2018 FINDINGS: Cardiac and mediastinal silhouettes are stable in size and contour, and remain within normal limits. Aortic atherosclerosis. Lungs normally inflated. Mild scattered subsegmental atelectasis seen involving the lung bases bilaterally. No consolidative airspace opacity. No pulmonary edema or pleural effusion. No pneumothorax. Nodular density overlying the mid right lung base favored to reflect a nipple shadow. No acute osseous abnormality. IMPRESSION: 1. Mild bibasilar subsegmental atelectasis. No other active cardiopulmonary disease. 2. Aortic atherosclerosis. Electronically Signed   By: Jeannine Boga M.D.   On: 05/10/2018 19:48   Ct Head Wo Contrast  Result Date: 05/10/2018 CLINICAL DATA:  Initial evaluation for acute headache, hallucinations. EXAM: CT HEAD WITHOUT CONTRAST TECHNIQUE: Contiguous axial images were obtained from the base of the skull through the vertex without intravenous contrast. COMPARISON:  None. FINDINGS: Brain: Generalized age-related cerebral atrophy with mild chronic small vessel ischemic disease. No acute intracranial hemorrhage. No acute large vessel territory infarct. No mass lesion, midline shift or mass effect. No hydrocephalus. No extra-axial fluid collection. Vascular: No hyperdense vessel. Scattered vascular calcifications noted within the carotid siphons. Skull: Scalp soft tissues and calvarium within normal limits. Sinuses/Orbits: Globes orbital soft tissues normal. Visualized paranasal sinuses are clear. No mastoid effusion. Other: None. IMPRESSION: 1. No acute intracranial abnormality. 2. Age-related cerebral atrophy with mild chronic small vessel  ischemic disease. Electronically Signed   By: Jeannine Boga M.D.   On: 05/10/2018 19:46   Ct L-spine No Charge  Result Date: 05/10/2018 CLINICAL DATA:  Initial evaluation for acute intermittent bilateral flank pain. EXAM: CT ABDOMEN AND PELVIS WITHOUT CONTRAST CT LUMBAR SPINE WITHOUT CONTRAST TECHNIQUE: Multidetector CT imaging of the abdomen and pelvis was performed following the standard protocol without IV contrast. COMPARISON:  Prior CT from 02/22/2018 FINDINGS: CT ABDOMEN AND PELVIS Lower chest: Visualized lung bases are clear. Hepatobiliary: Limited noncontrast evaluation of the liver is unremarkable. Mild vague hyperdensity within the gallbladder lumen, which could reflect internal sludge. Question of minimal hazy stranding about the gallbladder itself, which may reflect mild inflammation. No biliary dilatation. Pancreas: Pancreas within normal limits. Spleen: Spleen within normal limits. Adrenals/Urinary Tract: Adrenal glands are normal. Kidneys equal in size without nephrolithiasis or hydronephrosis. Bilateral perinephric stranding similar to previous, likely chronic. No radiopaque calculi seen along the course of either renal collecting system. No hydroureter. No layering stones within the bladder lumen. Mild circumferential bladder wall thickening with surrounding hazy stranding, which could be related incomplete distension or possibly acute cystitis. Stomach/Bowel: Small hiatal hernia noted. Stomach otherwise unremarkable. No evidence for bowel obstruction. Normal appendix. No acute inflammatory changes seen about the bowels. Vascular/Lymphatic: Extensive aorto bi-iliac atherosclerotic disease. Aortic stent endograft in place, grossly stable from previous. Aneurysmal dilatation of the infrarenal aorta up to 3.4 cm is stable from previous. Additional right common iliac artery aneurysm measuring up to 2.6 cm also unchanged. No adenopathy. Reproductive: Prostate normal. Other: Small fat  containing bilateral inguinal hernias noted, slightly larger on the left. No free air or fluid. Musculoskeletal: No acute osseous abnormality. No worrisome lytic or blastic osseous lesions. CT LUMBAR SPINE Segmentation: Normal segmentation. Lowest well-formed disc labeled the L5-S1 level. Alignment: Mild levoscoliosis with apex at L4. Trace retrolisthesis of L3 on L4 and L5 on S1. Vertebrae: Vertebral body heights maintained without evidence for acute or chronic fracture. No discrete  lytic or blastic osseous lesions. Visualized sacrum and pelvis intact. SI joints approximated and symmetric. Paraspinal and other soft tissues: Paraspinous soft tissues demonstrate no acute finding. Disc levels: L1-2:  Negative interspace.  Mild facet hypertrophy.  No stenosis. L2-3: Mild disc bulge. Mild bilateral facet hypertrophy. No significant stenosis. L3-4: Mild diffuse disc bulge with bilateral facet hypertrophy. Perhaps mild spinal stenosis. Mild bilateral L3 foraminal narrowing. L4-5: Chronic intervertebral disc space narrowing with diffuse disc bulge and disc desiccation. Reactive endplate changes with marginal endplate osteophytic spurring on the right. Posterior disc osteophyte flattens the ventral thecal sac and encroaches upon the lateral recess. Right greater than left facet hypertrophy. Resultant moderate canal with moderate to severe right greater than left lateral recess narrowing. Mild left with severe right L4 foraminal stenosis. L5-S1: Mild diffuse disc bulge with disc desiccation. Right greater left facet hypertrophy. Disc bulge asymmetric to the left, closely approximating the descending S1 nerve roots without frank impingement or displacement. Moderate left with mild right L5 foraminal stenosis. IMPRESSION: CT ABDOMEN AND PELVIS IMPRESSION 1. No CT evidence for nephrolithiasis or obstructive uropathy. 2. Mild circumferential bladder wall thickening with mild surrounding hazy stranding. While this finding may in  part be related incomplete distension, possible acute cystitis could also have this appearance. Correlation with urinalysis recommended. 3. Mild hyperdensity within the gallbladder lumen, suggesting internal sludge. Question surrounding mild hazy pericholecystic stranding. Correlation with laboratory values recommended. Additionally, further assessment with dedicated right upper quadrant ultrasound could be performed as clinically warranted. 4. No other acute intra-abdominal or pelvic process. 5. Extensive aorto bi-iliac atherosclerotic disease with aortic stent endograft in place. Stable infrarenal and right common iliac artery aneurysms. CT LUMBAR SPINE IMPRESSION 1. No acute abnormality within the lumbar spine. 2. Degenerative spondylolysis with facet hypertrophy at L4-5 with resultant moderate to severe bilateral lateral recess narrowing, potentially affecting either of the descending L5 nerve roots. 3. Disc bulge at L5-S1, closely approximating and potentially irritating either of the descending S1 nerve roots. 4. Multifactorial degenerative changes with resultant severe right L4 and moderate left L5 foraminal stenosis. Electronically Signed   By: Jeannine Boga M.D.   On: 05/10/2018 19:40   Ct Renal Stone Study  Result Date: 05/10/2018 CLINICAL DATA:  Initial evaluation for acute intermittent bilateral flank pain. EXAM: CT ABDOMEN AND PELVIS WITHOUT CONTRAST CT LUMBAR SPINE WITHOUT CONTRAST TECHNIQUE: Multidetector CT imaging of the abdomen and pelvis was performed following the standard protocol without IV contrast. COMPARISON:  Prior CT from 02/22/2018 FINDINGS: CT ABDOMEN AND PELVIS Lower chest: Visualized lung bases are clear. Hepatobiliary: Limited noncontrast evaluation of the liver is unremarkable. Mild vague hyperdensity within the gallbladder lumen, which could reflect internal sludge. Question of minimal hazy stranding about the gallbladder itself, which may reflect mild inflammation. No  biliary dilatation. Pancreas: Pancreas within normal limits. Spleen: Spleen within normal limits. Adrenals/Urinary Tract: Adrenal glands are normal. Kidneys equal in size without nephrolithiasis or hydronephrosis. Bilateral perinephric stranding similar to previous, likely chronic. No radiopaque calculi seen along the course of either renal collecting system. No hydroureter. No layering stones within the bladder lumen. Mild circumferential bladder wall thickening with surrounding hazy stranding, which could be related incomplete distension or possibly acute cystitis. Stomach/Bowel: Small hiatal hernia noted. Stomach otherwise unremarkable. No evidence for bowel obstruction. Normal appendix. No acute inflammatory changes seen about the bowels. Vascular/Lymphatic: Extensive aorto bi-iliac atherosclerotic disease. Aortic stent endograft in place, grossly stable from previous. Aneurysmal dilatation of the infrarenal aorta up to 3.4 cm is  stable from previous. Additional right common iliac artery aneurysm measuring up to 2.6 cm also unchanged. No adenopathy. Reproductive: Prostate normal. Other: Small fat containing bilateral inguinal hernias noted, slightly larger on the left. No free air or fluid. Musculoskeletal: No acute osseous abnormality. No worrisome lytic or blastic osseous lesions. CT LUMBAR SPINE Segmentation: Normal segmentation. Lowest well-formed disc labeled the L5-S1 level. Alignment: Mild levoscoliosis with apex at L4. Trace retrolisthesis of L3 on L4 and L5 on S1. Vertebrae: Vertebral body heights maintained without evidence for acute or chronic fracture. No discrete lytic or blastic osseous lesions. Visualized sacrum and pelvis intact. SI joints approximated and symmetric. Paraspinal and other soft tissues: Paraspinous soft tissues demonstrate no acute finding. Disc levels: L1-2:  Negative interspace.  Mild facet hypertrophy.  No stenosis. L2-3: Mild disc bulge. Mild bilateral facet hypertrophy. No  significant stenosis. L3-4: Mild diffuse disc bulge with bilateral facet hypertrophy. Perhaps mild spinal stenosis. Mild bilateral L3 foraminal narrowing. L4-5: Chronic intervertebral disc space narrowing with diffuse disc bulge and disc desiccation. Reactive endplate changes with marginal endplate osteophytic spurring on the right. Posterior disc osteophyte flattens the ventral thecal sac and encroaches upon the lateral recess. Right greater than left facet hypertrophy. Resultant moderate canal with moderate to severe right greater than left lateral recess narrowing. Mild left with severe right L4 foraminal stenosis. L5-S1: Mild diffuse disc bulge with disc desiccation. Right greater left facet hypertrophy. Disc bulge asymmetric to the left, closely approximating the descending S1 nerve roots without frank impingement or displacement. Moderate left with mild right L5 foraminal stenosis. IMPRESSION: CT ABDOMEN AND PELVIS IMPRESSION 1. No CT evidence for nephrolithiasis or obstructive uropathy. 2. Mild circumferential bladder wall thickening with mild surrounding hazy stranding. While this finding may in part be related incomplete distension, possible acute cystitis could also have this appearance. Correlation with urinalysis recommended. 3. Mild hyperdensity within the gallbladder lumen, suggesting internal sludge. Question surrounding mild hazy pericholecystic stranding. Correlation with laboratory values recommended. Additionally, further assessment with dedicated right upper quadrant ultrasound could be performed as clinically warranted. 4. No other acute intra-abdominal or pelvic process. 5. Extensive aorto bi-iliac atherosclerotic disease with aortic stent endograft in place. Stable infrarenal and right common iliac artery aneurysms. CT LUMBAR SPINE IMPRESSION 1. No acute abnormality within the lumbar spine. 2. Degenerative spondylolysis with facet hypertrophy at L4-5 with resultant moderate to severe bilateral  lateral recess narrowing, potentially affecting either of the descending L5 nerve roots. 3. Disc bulge at L5-S1, closely approximating and potentially irritating either of the descending S1 nerve roots. 4. Multifactorial degenerative changes with resultant severe right L4 and moderate left L5 foraminal stenosis. Electronically Signed   By: Jeannine Boga M.D.   On: 05/10/2018 19:40   US Abdomen Limited Ruq  Result Date: 05/10/2018 CLINICAL DATA:  Initial evaluation for acute abdominal pain. EXAM: ULTRASOUND ABDOMEN LIMITED RIGHT UPPER QUADRANT COMPARISON:  None. FINDINGS: Gallbladder: No gallstones or wall thickening visualized. No sonographic Murphy sign noted by sonographer. Common bile duct: Diameter: 3.2 mm Liver: No focal lesion identified. Increased hepatic echogenicity, compatible with steatosis. Portal vein is patent on color Doppler imaging with normal direction of blood flow towards the liver. IMPRESSION: 1. Normal sonographic evaluation of the gallbladder. No cholelithiasis, evidence for acute cholecystitis, or biliary dilatation. Previously questioned abnormality on prior CT most likely related to incomplete distension. 2. Hepatic steatosis. Electronically Signed   By: Jeannine Boga M.D.   On: 05/10/2018 22:35    Procedures Procedures (including critical care time)  Medications Ordered in ED Medications  ondansetron (ZOFRAN) injection 4 mg (4 mg Intravenous Not Given 05/10/18 2108)  sodium chloride 0.9 % bolus 1,000 mL (0 mLs Intravenous Stopped 05/10/18 2021)     Initial Impression / Assessment and Plan / ED Course  I have reviewed the triage vital signs and the nursing notes.  Pertinent labs & imaging results that were available during my care of the patient were reviewed by me and considered in my medical decision making (see chart for details).     72 yo M with a cc of headache.  This been going on for the past week.  The patient has been actively hallucinating.   Seems to come and go.  Will obtain AMS workup, urine, cxr.  Will CT head.  Give fluid bolus, reassess.   Chest x-ray without pneumonia.  UA negative for infection. Mild hyponatremia and hypochloridemia.  CT scan of the abdomen pelvis with possible sludge in the gallbladder.  Ultrasound is negative for acute pathology.  I am unsure if the patient's encephalopathy cause.  The timing is such that the patient was normal and started his antibiotic therapy for urinary tract infection and then got symptomatic.  Potentially insulin and the 2 medications that he was started, looks like he was started on Bactrim as well as Augmentin.  Both have been implicated in antibiotic induced encephalopathy in a quick literature search.  He denies any tick exposure or rash.  He still is significantly confused and different from his baseline.  Will discuss with the hospitalist.  The patients results and plan were reviewed and discussed.   Any x-rays performed were independently reviewed by myself.   Differential diagnosis were considered with the presenting HPI.  Medications  ondansetron [facility]) injection 4 mg (4 mg Intravenous Not Given 05/10/18 2108)  sodium chloride 0.9 % bolus 1,000 mL (0 mLs Intravenous Stopped 05/10/18 2021)    Vitals:   05/10/18 2100 05/10/18 2104 05/10/18 2130 05/10/18 2200  BP: 129/66 122/66 133/69 139/75  Pulse: 86 90 88 80  Resp: 19 (!) 23 (!) 22 14  Temp:      TempSrc:      SpO2: 100% 100% 99% 100%    Final diagnoses:  Abdominal pain, RUQ  Encephalitis  Hallucinations    Admission/ observation were discussed with the admitting physician, patient and/or family and they are comfortable with the plan.    Final Clinical Impressions(s) / ED Diagnoses   Final diagnoses:  Abdominal pain, RUQ  Encephalitis  Hallucinations    ED Discharge Orders    None       Deno Etienne, DO 05/10/18 2320

## 2018-05-10 NOTE — ED Notes (Addendum)
Per Dr Zadie Rhine at [facility] patient has had a change in mental status-states hallucinations-history of cirrhosis, pancytopenia-last drink was 8/31-states new onset of headaches for about 2 weeks-doesn't think it is withdrawal or related to his cirrhosis-wants eval/workup-states triple A in May

## 2018-05-11 ENCOUNTER — Encounter (HOSPITAL_COMMUNITY): Payer: Self-pay

## 2018-05-11 ENCOUNTER — Other Ambulatory Visit: Payer: Self-pay

## 2018-05-11 DIAGNOSIS — R443 Hallucinations, unspecified: Secondary | ICD-10-CM | POA: Diagnosis present

## 2018-05-11 DIAGNOSIS — K219 Gastro-esophageal reflux disease without esophagitis: Secondary | ICD-10-CM | POA: Diagnosis present

## 2018-05-11 DIAGNOSIS — E876 Hypokalemia: Secondary | ICD-10-CM | POA: Diagnosis present

## 2018-05-11 DIAGNOSIS — N400 Enlarged prostate without lower urinary tract symptoms: Secondary | ICD-10-CM | POA: Diagnosis present

## 2018-05-11 DIAGNOSIS — I509 Heart failure, unspecified: Secondary | ICD-10-CM | POA: Diagnosis present

## 2018-05-11 DIAGNOSIS — E871 Hypo-osmolality and hyponatremia: Secondary | ICD-10-CM | POA: Diagnosis present

## 2018-05-11 DIAGNOSIS — Z8249 Family history of ischemic heart disease and other diseases of the circulatory system: Secondary | ICD-10-CM | POA: Diagnosis not present

## 2018-05-11 DIAGNOSIS — Z87891 Personal history of nicotine dependence: Secondary | ICD-10-CM | POA: Diagnosis not present

## 2018-05-11 DIAGNOSIS — R21 Rash and other nonspecific skin eruption: Secondary | ICD-10-CM | POA: Diagnosis present

## 2018-05-11 DIAGNOSIS — R441 Visual hallucinations: Secondary | ICD-10-CM | POA: Diagnosis present

## 2018-05-11 DIAGNOSIS — I10 Essential (primary) hypertension: Secondary | ICD-10-CM

## 2018-05-11 DIAGNOSIS — R1011 Right upper quadrant pain: Secondary | ICD-10-CM

## 2018-05-11 DIAGNOSIS — N390 Urinary tract infection, site not specified: Secondary | ICD-10-CM | POA: Diagnosis present

## 2018-05-11 DIAGNOSIS — E785 Hyperlipidemia, unspecified: Secondary | ICD-10-CM | POA: Diagnosis present

## 2018-05-11 DIAGNOSIS — G9341 Metabolic encephalopathy: Secondary | ICD-10-CM | POA: Diagnosis present

## 2018-05-11 DIAGNOSIS — I110 Hypertensive heart disease with heart failure: Secondary | ICD-10-CM | POA: Diagnosis present

## 2018-05-11 DIAGNOSIS — I2510 Atherosclerotic heart disease of native coronary artery without angina pectoris: Secondary | ICD-10-CM | POA: Diagnosis present

## 2018-05-11 DIAGNOSIS — D638 Anemia in other chronic diseases classified elsewhere: Secondary | ICD-10-CM | POA: Diagnosis present

## 2018-05-11 DIAGNOSIS — F1010 Alcohol abuse, uncomplicated: Secondary | ICD-10-CM

## 2018-05-11 DIAGNOSIS — G8929 Other chronic pain: Secondary | ICD-10-CM | POA: Diagnosis present

## 2018-05-11 DIAGNOSIS — Z7982 Long term (current) use of aspirin: Secondary | ICD-10-CM | POA: Diagnosis not present

## 2018-05-11 DIAGNOSIS — K760 Fatty (change of) liver, not elsewhere classified: Secondary | ICD-10-CM | POA: Diagnosis present

## 2018-05-11 DIAGNOSIS — F1020 Alcohol dependence, uncomplicated: Secondary | ICD-10-CM | POA: Diagnosis present

## 2018-05-11 LAB — CBC
HCT: 29.6 % — ABNORMAL LOW (ref 39.0–52.0)
Hemoglobin: 10.1 g/dL — ABNORMAL LOW (ref 13.0–17.0)
MCH: 33.9 pg (ref 26.0–34.0)
MCHC: 34.1 g/dL (ref 30.0–36.0)
MCV: 99.3 fL (ref 78.0–100.0)
PLATELETS: 369 10*3/uL (ref 150–400)
RBC: 2.98 MIL/uL — AB (ref 4.22–5.81)
RDW: 12 % (ref 11.5–15.5)
WBC: 5.9 10*3/uL (ref 4.0–10.5)

## 2018-05-11 LAB — BASIC METABOLIC PANEL
Anion gap: 16 — ABNORMAL HIGH (ref 5–15)
BUN: 10 mg/dL (ref 8–23)
CO2: 17 mmol/L — ABNORMAL LOW (ref 22–32)
CREATININE: 1.05 mg/dL (ref 0.61–1.24)
Calcium: 8.7 mg/dL — ABNORMAL LOW (ref 8.9–10.3)
Chloride: 95 mmol/L — ABNORMAL LOW (ref 98–111)
GFR calc Af Amer: >60 mL/min (ref >60)
Glucose, Bld: 64 mg/dL — ABNORMAL LOW (ref 70–99)
POTASSIUM: 3.5 mmol/L (ref 3.5–5.1)
SODIUM: 128 mmol/L — AB (ref 135–145)

## 2018-05-11 LAB — GLUCOSE, CAPILLARY
GLUCOSE-CAPILLARY: 103 mg/dL — AB (ref 70–99)
GLUCOSE-CAPILLARY: 90 mg/dL (ref 70–99)
Glucose-Capillary: 224 mg/dL — ABNORMAL HIGH (ref 70–99)
Glucose-Capillary: 81 mg/dL (ref 70–99)
Glucose-Capillary: 92 mg/dL (ref 70–99)

## 2018-05-11 LAB — RAPID URINE DRUG SCREEN, HOSP PERFORMED
AMPHETAMINES: NOT DETECTED
BARBITURATES: NOT DETECTED
Benzodiazepines: NOT DETECTED
Cocaine: NOT DETECTED
Opiates: NOT DETECTED
TETRAHYDROCANNABINOL: NOT DETECTED

## 2018-05-11 LAB — OSMOLALITY: Osmolality: 258 mOsm/kg — ABNORMAL LOW (ref 275–295)

## 2018-05-11 LAB — OSMOLALITY, URINE: OSMOLALITY UR: 193 mosm/kg — AB (ref 300–900)

## 2018-05-11 LAB — TSH: TSH: 3.513 u[IU]/mL (ref 0.350–4.500)

## 2018-05-11 LAB — SODIUM, URINE, RANDOM: Sodium, Ur: 23 mmol/L

## 2018-05-11 MED ORDER — PANTOPRAZOLE SODIUM 40 MG PO TBEC
40.0000 mg | DELAYED_RELEASE_TABLET | Freq: Every day | ORAL | Status: DC
  Administered 2018-05-11 – 2018-05-12 (×2): 40 mg via ORAL
  Filled 2018-05-11 (×2): qty 1

## 2018-05-11 MED ORDER — ALBUTEROL SULFATE (2.5 MG/3ML) 0.083% IN NEBU: 2.5000 mg | INHALATION_SOLUTION | Freq: Four times a day (QID) | RESPIRATORY_TRACT | Status: DC | PRN

## 2018-05-11 MED ORDER — ONDANSETRON HCL 4 MG/2ML IJ SOLN
4.0000 mg | Freq: Four times a day (QID) | INTRAMUSCULAR | Status: DC | PRN
  Administered 2018-05-12: 4 mg via INTRAVENOUS
  Filled 2018-05-11: qty 2

## 2018-05-11 MED ORDER — LORAZEPAM 2 MG/ML IJ SOLN: 1.0000 mg | Freq: Four times a day (QID) | INTRAMUSCULAR | Status: DC | PRN

## 2018-05-11 MED ORDER — AMLODIPINE BESYLATE 5 MG PO TABS
2.5000 mg | ORAL_TABLET | Freq: Every day | ORAL | Status: DC
  Administered 2018-05-11 – 2018-05-12 (×2): 2.5 mg via ORAL
  Filled 2018-05-11 (×2): qty 1

## 2018-05-11 MED ORDER — ACETAMINOPHEN 325 MG PO TABS
650.0000 mg | ORAL_TABLET | Freq: Four times a day (QID) | ORAL | Status: DC | PRN
  Administered 2018-05-11 – 2018-05-12 (×4): 650 mg via ORAL
  Filled 2018-05-11 (×5): qty 2

## 2018-05-11 MED ORDER — ENOXAPARIN SODIUM 40 MG/0.4ML ~~LOC~~ SOLN
40.0000 mg | SUBCUTANEOUS | Status: DC
  Administered 2018-05-11 – 2018-05-12 (×2): 40 mg via SUBCUTANEOUS
  Filled 2018-05-11 (×2): qty 0.4

## 2018-05-11 MED ORDER — POTASSIUM CHLORIDE CRYS ER 20 MEQ PO TBCR
40.0000 meq | EXTENDED_RELEASE_TABLET | ORAL | Status: AC
  Administered 2018-05-11: 40 meq via ORAL
  Filled 2018-05-11: qty 2

## 2018-05-11 MED ORDER — ONDANSETRON HCL 4 MG PO TABS: 4.0000 mg | ORAL_TABLET | Freq: Four times a day (QID) | ORAL | Status: DC | PRN

## 2018-05-11 MED ORDER — BISOPROLOL FUMARATE 5 MG PO TABS
15.0000 mg | ORAL_TABLET | Freq: Every day | ORAL | Status: DC
  Administered 2018-05-11 – 2018-05-12 (×2): 15 mg via ORAL
  Filled 2018-05-11 (×2): qty 3

## 2018-05-11 MED ORDER — DEXTROSE 50 % IV SOLN
1.0000 | INTRAVENOUS | Status: AC
  Administered 2018-05-11: 50 mL via INTRAVENOUS
  Filled 2018-05-11: qty 50

## 2018-05-11 MED ORDER — THIAMINE HCL 100 MG/ML IJ SOLN
500.0000 mg | Freq: Three times a day (TID) | INTRAVENOUS | Status: DC
  Administered 2018-05-11 – 2018-05-12 (×5): 500 mg via INTRAVENOUS
  Filled 2018-05-11 (×6): qty 5

## 2018-05-11 MED ORDER — ACETAMINOPHEN 650 MG RE SUPP: 650.0000 mg | Freq: Four times a day (QID) | RECTAL | Status: DC | PRN

## 2018-05-11 MED ORDER — LORAZEPAM 2 MG/ML IJ SOLN
0.0000 mg | Freq: Four times a day (QID) | INTRAMUSCULAR | Status: DC
  Administered 2018-05-11: 2 mg via INTRAVENOUS
  Filled 2018-05-11 (×2): qty 1

## 2018-05-11 MED ORDER — THIAMINE HCL 100 MG/ML IJ SOLN: 100.0000 mg | Freq: Every day | INTRAMUSCULAR | Status: DC

## 2018-05-11 MED ORDER — LIP MEDEX EX OINT
TOPICAL_OINTMENT | CUTANEOUS | Status: AC
  Administered 2018-05-11: 1 via TOPICAL
  Filled 2018-05-11: qty 7

## 2018-05-11 MED ORDER — RISAQUAD PO CAPS
1.0000 | ORAL_CAPSULE | Freq: Every day | ORAL | Status: DC
  Administered 2018-05-11 – 2018-05-12 (×2): 1 via ORAL
  Filled 2018-05-11 (×2): qty 1

## 2018-05-11 MED ORDER — SODIUM CHLORIDE 0.9 % IV SOLN
1.0000 g | Freq: Every day | INTRAVENOUS | Status: DC
  Administered 2018-05-11 (×2): 1 g via INTRAVENOUS
  Filled 2018-05-11 (×2): qty 10
  Filled 2018-05-11: qty 1

## 2018-05-11 MED ORDER — TAMSULOSIN HCL 0.4 MG PO CAPS
0.4000 mg | ORAL_CAPSULE | Freq: Every day | ORAL | Status: DC
  Administered 2018-05-11 – 2018-05-12 (×2): 0.4 mg via ORAL
  Filled 2018-05-11 (×2): qty 1

## 2018-05-11 MED ORDER — ATORVASTATIN CALCIUM 20 MG PO TABS
20.0000 mg | ORAL_TABLET | Freq: Every day | ORAL | Status: DC
  Administered 2018-05-11 – 2018-05-12 (×2): 20 mg via ORAL
  Filled 2018-05-11 (×2): qty 1

## 2018-05-11 MED ORDER — ADULT MULTIVITAMIN W/MINERALS CH
1.0000 | ORAL_TABLET | Freq: Every day | ORAL | Status: DC
  Administered 2018-05-11 – 2018-05-12 (×2): 1 via ORAL
  Filled 2018-05-11 (×2): qty 1

## 2018-05-11 MED ORDER — SODIUM CHLORIDE 0.9 % IV SOLN
INTRAVENOUS | Status: DC
  Administered 2018-05-11 – 2018-05-12 (×3): via INTRAVENOUS

## 2018-05-11 MED ORDER — LORAZEPAM 2 MG/ML IJ SOLN: 0.0000 mg | Freq: Two times a day (BID) | INTRAMUSCULAR | Status: DC

## 2018-05-11 MED ORDER — VITAMIN B-1 100 MG PO TABS: 100.0000 mg | ORAL_TABLET | Freq: Every day | ORAL | Status: DC

## 2018-05-11 MED ORDER — FOLIC ACID 1 MG PO TABS
1.0000 mg | ORAL_TABLET | Freq: Every day | ORAL | Status: DC
  Administered 2018-05-11 – 2018-05-12 (×2): 1 mg via ORAL
  Filled 2018-05-11 (×2): qty 1

## 2018-05-11 MED ORDER — LORAZEPAM 1 MG PO TABS: 1.0000 mg | ORAL_TABLET | Freq: Four times a day (QID) | ORAL | Status: DC | PRN

## 2018-05-11 MED ORDER — ASPIRIN EC 81 MG PO TBEC
81.0000 mg | DELAYED_RELEASE_TABLET | Freq: Every day | ORAL | Status: DC
  Administered 2018-05-11 – 2018-05-12 (×2): 81 mg via ORAL
  Filled 2018-05-11 (×2): qty 1

## 2018-05-11 MED ORDER — BUTALBITAL-APAP-CAFFEINE 50-325-40 MG PO TABS
1.0000 | ORAL_TABLET | ORAL | Status: AC
  Administered 2018-05-11: 1 via ORAL
  Filled 2018-05-11: qty 1

## 2018-05-11 NOTE — ED Notes (Signed)
Dr. Tamala Julian with the patient now for assessment.

## 2018-05-11 NOTE — Plan of Care (Signed)
Pt. Is stable, calm, resting in bed. Pt re- educated to use call bell for assistance.

## 2018-05-11 NOTE — Progress Notes (Signed)
The patient was admitted early this AM after midnight and H and P has been reviewed and I am in current agreement with the Assessment and Plan done by Dr. Fuller Plan. Additional changes to the plan of care have been made accordingly. The patient is a 72 year old Caucasian male with a past medical history significant for hypertension, hyperlipidemia, CAD, history of AAA status post repair, history of chronic alcoholism, hemochromatosis and other comorbidities who presented with visual hallucinations for at least 1 week.  He was admitted with acute encephalopathy with hallucinations likely secondary to possible alcohol withdrawal versus medication induced versus other etiology.  We will treat the patient as if he had a urinary tract infection given his thickening concern for acute cystitis and confusion.  We will continue to monitor and replete patient's electrolytes and if patient does not improve we will obtain MRI and possible neurological consultation.  We will continue to monitor patient's clinical response to intervention and repeat blood work in the morning.

## 2018-05-11 NOTE — Plan of Care (Signed)
Pt stable on arrival to floor. Pt confused and cannot stay on topic on arrival to floor. Pt very weak transferring to bed from stretcher.

## 2018-05-11 NOTE — H&P (Addendum)
History and Physical    Corey Garcia LKG:[id number] DOB: July 15, 1946 DOA: 05/10/2018  Referring MD/NP/PA: Deno Etienne, MD PCP: [name], MD  Patient coming from: Home  Chief Complaint: Hallucinations  I have personally briefly reviewed patient's old medical records in [place]   HPI: Corey Garcia is a 72 y.o. male with medical history significant of HTN, HLD, CAD, AAA s/p repair, alcoholism, hemochromatosis; who presented with complaints of visual hallucinations the last 1 week.  It appears that patient probably stop drinking alcohol over the Labor Day weekend.  Normally patient had drank alcohol on a daily basis he has been on Bactrim for treatment of acne of his scalp followed by [place] [facility] for several weeks to complete a 31-month course.  However, he recently had been diagnosed with a urinary tract infection by his primary care provider and had been started on Augmentin 5 days ago.  Since that time patient reports having visual hallucinations of things moving up and down that are not moving and thinking that it is raining indoors when it is not.  His last eye exam was noted to be relatively normal back in May.  Family also notes that the patient has been more confused than normal.  Associated symptoms include complaints of back pain, frontal headache, insomnia, intermittent chills, and shakes.  His temperature at home hadnot gotten above 100 F.      ED Course: On admission to the emergency department patient was seen to be afebrile, pulse 80-96, respirations 13-26, and all other vital signs maintained.  Labs revealed WBC 5.4, hemoglobin 10.7, sodium 127, potassium 3.4, chloride 92, CO2 20, glucose 74, alcohol level undetectable, and lactic acid 0.86.  Urine drug screen negative for any acute abnormality.  Urinalysis showed no clear signs of infection.  CT scan of the brain showed no acute abnormalities. CT scan of the abdomen and pelvis patient had been given 1 L  normal saline IV fluids and Zofran.     Review of Systems  Constitutional: Positive for chills and malaise/fatigue.  HENT: Negative for congestion and nosebleeds.   Eyes: Negative for double vision and photophobia.  Respiratory: Negative for cough and shortness of breath.   Cardiovascular: Negative for chest pain and leg swelling.  Gastrointestinal: Positive for nausea. Negative for abdominal pain, diarrhea and vomiting.  Genitourinary: Positive for frequency. Negative for dysuria.  Musculoskeletal: Positive for back pain. Negative for falls and joint pain.  Skin: Positive for rash (Chronic rash of the scalp).  Neurological: Positive for tremors. Negative for focal weakness and loss of consciousness.  Psychiatric/Behavioral: Positive for hallucinations and memory loss. Negative for substance abuse.    Past Medical History:  Diagnosis Date  . Alcoholism ([place]) 03/15/2014  . Anemia   . Bradycardia by electrocardiogram    BB decreased 11/12/11  . CAD (coronary artery disease)    last cath=01/2008, PCI  . CHF (congestive heart failure) ([place])   . Dyslipidemia   . ED (erectile dysfunction)    viagra helps  . GERD (gastroesophageal reflux disease)   . HTN (hypertension)   . Iron overload 03/15/2014  . Liver disease due to alcohol ([place]) 03/15/2014  . Murmur, cardiac    faint early systolic grade 1/6 aortic ejection murmur  . Myocardial infarction ([place])   . Peripheral vascular disease ([place])   . Rib pain 04/16/2014  . Skin cancer    basal cell ca X 4    Past Surgical History:  Procedure Laterality Date  .  ABDOMINAL AORTIC ENDOVASCULAR STENT GRAFT N/A 01/13/2018   Procedure: ABDOMINAL AORTIC ENDOVASCULAR STENT GRAFT;  Surgeon: Angelia Mould, MD;  Location: [facility] OR;  Service: Vascular;  Laterality: N/A;  . CORONARY ANGIOPLASTY WITH STENT PLACEMENT  01/31/2008   PCI with cutting balloon arthrectomy of the ostium of the circ obtuse marg 2 vessel  . CORONARY ANGIOPLASTY WITH STENT  PLACEMENT  01/27/08   nl LV function, PTCA/stenting of LAD with 3x35mm Cypher post dilated 3.59mm  . HERNIA REPAIR    . KNEE ARTHROSCOPY Left   . MOHS SURGERY     x4  . NASAL SEPTUM SURGERY       reports that he quit smoking about 35 years ago. He has never used smokeless tobacco. He reports that he drinks about 28.0 standard drinks of alcohol per week. He reports that he does not use drugs.  Allergies  Allergen Reactions  . Lisinopril Other (See Comments)    PHOTOSENSITIVITY ON ARMS  . Minocin [Minocycline Hcl]     Flank pain  . Thiazide-Type Diuretics Other (See Comments)    "Lowered my blood pressure"  . Codeine Nausea Only    Family History  Problem Relation Age of Onset  . Heart failure Mother        heart disease  . Heart disease Mother   . Heart failure Father        heart disease  . Heart disease Father   . Heart disease Brother   . Heart failure Brother        "bad heart valve"    Prior to Admission medications   Medication Sig Start Date End Date Taking? Authorizing Provider  acetaminophen (TYLENOL) 650 MG CR tablet Take 1,300 mg by mouth every 8 (eight) hours.   Yes [provider]  amLODipine (NORVASC) 2.5 MG tablet Take 1 tablet (2.5 mg total) by mouth daily. 01/25/18 05/10/18 Yes Croitoru, Mihai, MD  amoxicillin-clavulanate (AUGMENTIN) 875-125 MG tablet Take 1 tablet by mouth every 12 (twelve) hours. 05/05/18  Yes [provider]  aspirin 81 MG tablet Take 81 mg by mouth daily.   Yes [provider]  atorvastatin (LIPITOR) 20 MG tablet Take 20 mg by mouth daily.   Yes [provider]  bisoprolol (ZEBETA) 10 MG tablet Take 15 mg by mouth daily. Take 1.5 tablets by mouth daily. 08/22/16  Yes [provider]  cholecalciferol (VITAMIN D) 1000 units tablet Take 1,000 Units by mouth daily.   Yes [provider]  hydrocortisone (PROCTO-MED HC) 2.5 % rectal cream Place 1 application rectally as needed for hemorrhoids or  itching.   Yes [provider]  loratadine (CLARITIN) 10 MG tablet Take 1 tablet by mouth daily.   Yes [provider]  Multiple Vitamins-Minerals (ONE DAILY MULTIVITAMIN MEN PO) Take 1 tablet by mouth daily.    Yes [provider]  pantoprazole (PROTONIX) 40 MG tablet Take 40 mg by mouth daily.    Yes [provider]  Probiotic Product (PROBIOTIC PO) Take 1 tablet by mouth daily.   Yes [provider]  sulfamethoxazole-trimethoprim (BACTRIM DS,SEPTRA DS) 800-160 MG tablet Take 1 tablet by mouth 2 (two) times daily. 05/05/18  Yes [provider]  tacrolimus (PROTOPIC) 0.03 % ointment Apply 1 application topically daily as needed (dermatitis on face).    Yes [provider]  tamsulosin (FLOMAX) 0.4 MG CAPS capsule Take 0.4 mg by mouth daily.   Yes [provider]  oxyCODONE (OXY IR/ROXICODONE) 5 MG immediate  release tablet Take 1 tablet (5 mg total) by mouth every 6 (six) hours as needed for moderate pain. Patient not taking: Reported on 05/10/2018 01/14/18   Dagoberto Ligas, PA-C    Physical Exam:  Constitutional: Elderly male who appears to be in some discomfort Vitals:   05/10/18 2200 05/10/18 2300 05/10/18 2330 05/11/18 0000  BP: 139/75 125/63 128/61 (!) 129/59  Pulse: 80 81 82 84  Resp: 14 13 20 18   Temp:      TempSrc:      SpO2: 100% 96% 97% 97%   Eyes: PERRL, lids and conjunctivae normal ENMT: Mucous membranes are moist. Posterior pharynx clear of any exudate or lesions.  Neck: normal, supple, no masses, no thyromegaly Respiratory: clear to auscultation bilaterally, no wheezing, no crackles. Normal respiratory effort. No accessory muscle use.  Cardiovascular: Regular rate and rhythm, no murmurs / rubs / gallops. No extremity edema. 2+ pedal pulses. No carotid bruits.  Abdomen: no tenderness, no masses palpated. No hepatosplenomegaly. Bowel sounds positive.  Musculoskeletal: no clubbing / cyanosis. No joint  deformity upper and lower extremities. Good ROM, no contractures. Normal muscle tone.  Skin: Rash noted of the anterior scalp. Neurologic: CN 2-12 grossly intact. Sensation intact, DTR normal.  Mild tremor present.  Otherwise normal strength 5 out of 5. Psychiatric: . Alert and oriented x 3, but intermittently confused.  Anxious mood.     Labs on Admission: I have personally reviewed following labs and imaging studies  CBC: Recent Labs  Lab 05/10/18 1812 05/10/18 1822  WBC 5.4  --   NEUTROABS 3.9  --   HGB 10.7* 11.2*  HCT 31.2* 33.0*  MCV 98.1  --   PLT 228  --    Basic Metabolic Panel: Recent Labs  Lab 05/10/18 1812 05/10/18 1822  NA 127* 123*  K 3.4* 3.5  CL 92* 91*  CO2 20*  --   GLUCOSE 74 71  BUN 11 10  CREATININE 1.12 1.00  CALCIUM 8.9  --    GFR: CrCl cannot be calculated (Unknown ideal weight.). Liver Function Tests: Recent Labs  Lab 05/10/18 1812  AST 40  ALT 28  ALKPHOS 92  BILITOT 0.9  PROT 7.4  ALBUMIN 3.5   No results for input(s): LIPASE, AMYLASE in the last 168 hours. Recent Labs  Lab 05/10/18 1812  AMMONIA 16   Coagulation Profile: No results for input(s): INR, PROTIME in the last 168 hours. Cardiac Enzymes: No results for input(s): CKTOTAL, CKMB, CKMBINDEX, TROPONINI in the last 168 hours. BNP (last 3 results) No results for input(s): PROBNP in the last 8760 hours. HbA1C: No results for input(s): HGBA1C in the last 72 hours. CBG: No results for input(s): GLUCAP in the last 168 hours. Lipid Profile: No results for input(s): CHOL, HDL, LDLCALC, TRIG, CHOLHDL, LDLDIRECT in the last 72 hours. Thyroid Function Tests: No results for input(s): TSH, T4TOTAL, FREET4, T3FREE, THYROIDAB in the last 72 hours. Anemia Panel: No results for input(s): VITAMINB12, FOLATE, FERRITIN, TIBC, IRON, RETICCTPCT in the last 72 hours. Urine analysis:    Component Value Date/Time   COLORURINE YELLOW 05/10/2018 2024   APPEARANCEUR CLEAR 05/10/2018 2024     LABSPEC 1.006 05/10/2018 2024   PHURINE 6.0 05/10/2018 2024   GLUCOSEU NEGATIVE 05/10/2018 2024   HGBUR NEGATIVE 05/10/2018 2024   BILIRUBINUR NEGATIVE 05/10/2018 2024   KETONESUR 20 (A) 05/10/2018 2024   PROTEINUR NEGATIVE 05/10/2018 2024   NITRITE NEGATIVE 05/10/2018 2024   LEUKOCYTESUR NEGATIVE 05/10/2018 2024   Sepsis Labs:  No results found for this or any previous visit (from the past 240 hour(s)).   Radiological Exams on Admission: Dg Chest 2 View  Result Date: 05/10/2018 CLINICAL DATA:  Initial evaluation for acute altered mental status. EXAM: CHEST - 2 VIEW COMPARISON:  Prior radiograph from 01/13/2018 FINDINGS: Cardiac and mediastinal silhouettes are stable in size and contour, and remain within normal limits. Aortic atherosclerosis. Lungs normally inflated. Mild scattered subsegmental atelectasis seen involving the lung bases bilaterally. No consolidative airspace opacity. No pulmonary edema or pleural effusion. No pneumothorax. Nodular density overlying the mid right lung base favored to reflect a nipple shadow. No acute osseous abnormality. IMPRESSION: 1. Mild bibasilar subsegmental atelectasis. No other active cardiopulmonary disease. 2. Aortic atherosclerosis. Electronically Signed   By: Jeannine Boga M.D.   On: 05/10/2018 19:48   Ct Head Wo Contrast  Result Date: 05/10/2018 CLINICAL DATA:  Initial evaluation for acute headache, hallucinations. EXAM: CT HEAD WITHOUT CONTRAST TECHNIQUE: Contiguous axial images were obtained from the base of the skull through the vertex without intravenous contrast. COMPARISON:  None. FINDINGS: Brain: Generalized age-related cerebral atrophy with mild chronic small vessel ischemic disease. No acute intracranial hemorrhage. No acute large vessel territory infarct. No mass lesion, midline shift or mass effect. No hydrocephalus. No extra-axial fluid collection. Vascular: No hyperdense vessel. Scattered vascular calcifications noted within the  carotid siphons. Skull: Scalp soft tissues and calvarium within normal limits. Sinuses/Orbits: Globes orbital soft tissues normal. Visualized paranasal sinuses are clear. No mastoid effusion. Other: None. IMPRESSION: 1. No acute intracranial abnormality. 2. Age-related cerebral atrophy with mild chronic small vessel ischemic disease. Electronically Signed   By: Jeannine Boga M.D.   On: 05/10/2018 19:46   Ct L-spine No Charge  Result Date: 05/10/2018 CLINICAL DATA:  Initial evaluation for acute intermittent bilateral flank pain. EXAM: CT ABDOMEN AND PELVIS WITHOUT CONTRAST CT LUMBAR SPINE WITHOUT CONTRAST TECHNIQUE: Multidetector CT imaging of the abdomen and pelvis was performed following the standard protocol without IV contrast. COMPARISON:  Prior CT from 02/22/2018 FINDINGS: CT ABDOMEN AND PELVIS Lower chest: Visualized lung bases are clear. Hepatobiliary: Limited noncontrast evaluation of the liver is unremarkable. Mild vague hyperdensity within the gallbladder lumen, which could reflect internal sludge. Question of minimal hazy stranding about the gallbladder itself, which may reflect mild inflammation. No biliary dilatation. Pancreas: Pancreas within normal limits. Spleen: Spleen within normal limits. Adrenals/Urinary Tract: Adrenal glands are normal. Kidneys equal in size without nephrolithiasis or hydronephrosis. Bilateral perinephric stranding similar to previous, likely chronic. No radiopaque calculi seen along the course of either renal collecting system. No hydroureter. No layering stones within the bladder lumen. Mild circumferential bladder wall thickening with surrounding hazy stranding, which could be related incomplete distension or possibly acute cystitis. Stomach/Bowel: Small hiatal hernia noted. Stomach otherwise unremarkable. No evidence for bowel obstruction. Normal appendix. No acute inflammatory changes seen about the bowels. Vascular/Lymphatic: Extensive aorto bi-iliac  atherosclerotic disease. Aortic stent endograft in place, grossly stable from previous. Aneurysmal dilatation of the infrarenal aorta up to 3.4 cm is stable from previous. Additional right common iliac artery aneurysm measuring up to 2.6 cm also unchanged. No adenopathy. Reproductive: Prostate normal. Other: Small fat containing bilateral inguinal hernias noted, slightly larger on the left. No free air or fluid. Musculoskeletal: No acute osseous abnormality. No worrisome lytic or blastic osseous lesions. CT LUMBAR SPINE Segmentation: Normal segmentation. Lowest well-formed disc labeled the L5-S1 level. Alignment: Mild levoscoliosis with apex at L4. Trace retrolisthesis of L3 on L4 and L5 on S1. Vertebrae:  Vertebral body heights maintained without evidence for acute or chronic fracture. No discrete lytic or blastic osseous lesions. Visualized sacrum and pelvis intact. SI joints approximated and symmetric. Paraspinal and other soft tissues: Paraspinous soft tissues demonstrate no acute finding. Disc levels: L1-2:  Negative interspace.  Mild facet hypertrophy.  No stenosis. L2-3: Mild disc bulge. Mild bilateral facet hypertrophy. No significant stenosis. L3-4: Mild diffuse disc bulge with bilateral facet hypertrophy. Perhaps mild spinal stenosis. Mild bilateral L3 foraminal narrowing. L4-5: Chronic intervertebral disc space narrowing with diffuse disc bulge and disc desiccation. Reactive endplate changes with marginal endplate osteophytic spurring on the right. Posterior disc osteophyte flattens the ventral thecal sac and encroaches upon the lateral recess. Right greater than left facet hypertrophy. Resultant moderate canal with moderate to severe right greater than left lateral recess narrowing. Mild left with severe right L4 foraminal stenosis. L5-S1: Mild diffuse disc bulge with disc desiccation. Right greater left facet hypertrophy. Disc bulge asymmetric to the left, closely approximating the descending S1 nerve  roots without frank impingement or displacement. Moderate left with mild right L5 foraminal stenosis. IMPRESSION: CT ABDOMEN AND PELVIS IMPRESSION 1. No CT evidence for nephrolithiasis or obstructive uropathy. 2. Mild circumferential bladder wall thickening with mild surrounding hazy stranding. While this finding may in part be related incomplete distension, possible acute cystitis could also have this appearance. Correlation with urinalysis recommended. 3. Mild hyperdensity within the gallbladder lumen, suggesting internal sludge. Question surrounding mild hazy pericholecystic stranding. Correlation with laboratory values recommended. Additionally, further assessment with dedicated right upper quadrant ultrasound could be performed as clinically warranted. 4. No other acute intra-abdominal or pelvic process. 5. Extensive aorto bi-iliac atherosclerotic disease with aortic stent endograft in place. Stable infrarenal and right common iliac artery aneurysms. CT LUMBAR SPINE IMPRESSION 1. No acute abnormality within the lumbar spine. 2. Degenerative spondylolysis with facet hypertrophy at L4-5 with resultant moderate to severe bilateral lateral recess narrowing, potentially affecting either of the descending L5 nerve roots. 3. Disc bulge at L5-S1, closely approximating and potentially irritating either of the descending S1 nerve roots. 4. Multifactorial degenerative changes with resultant severe right L4 and moderate left L5 foraminal stenosis. Electronically Signed   By: Jeannine Boga M.D.   On: 05/10/2018 19:40   Ct Renal Stone Study  Result Date: 05/10/2018 CLINICAL DATA:  Initial evaluation for acute intermittent bilateral flank pain. EXAM: CT ABDOMEN AND PELVIS WITHOUT CONTRAST CT LUMBAR SPINE WITHOUT CONTRAST TECHNIQUE: Multidetector CT imaging of the abdomen and pelvis was performed following the standard protocol without IV contrast. COMPARISON:  Prior CT from 02/22/2018 FINDINGS: CT ABDOMEN AND  PELVIS Lower chest: Visualized lung bases are clear. Hepatobiliary: Limited noncontrast evaluation of the liver is unremarkable. Mild vague hyperdensity within the gallbladder lumen, which could reflect internal sludge. Question of minimal hazy stranding about the gallbladder itself, which may reflect mild inflammation. No biliary dilatation. Pancreas: Pancreas within normal limits. Spleen: Spleen within normal limits. Adrenals/Urinary Tract: Adrenal glands are normal. Kidneys equal in size without nephrolithiasis or hydronephrosis. Bilateral perinephric stranding similar to previous, likely chronic. No radiopaque calculi seen along the course of either renal collecting system. No hydroureter. No layering stones within the bladder lumen. Mild circumferential bladder wall thickening with surrounding hazy stranding, which could be related incomplete distension or possibly acute cystitis. Stomach/Bowel: Small hiatal hernia noted. Stomach otherwise unremarkable. No evidence for bowel obstruction. Normal appendix. No acute inflammatory changes seen about the bowels. Vascular/Lymphatic: Extensive aorto bi-iliac atherosclerotic disease. Aortic stent endograft in place, grossly stable  from previous. Aneurysmal dilatation of the infrarenal aorta up to 3.4 cm is stable from previous. Additional right common iliac artery aneurysm measuring up to 2.6 cm also unchanged. No adenopathy. Reproductive: Prostate normal. Other: Small fat containing bilateral inguinal hernias noted, slightly larger on the left. No free air or fluid. Musculoskeletal: No acute osseous abnormality. No worrisome lytic or blastic osseous lesions. CT LUMBAR SPINE Segmentation: Normal segmentation. Lowest well-formed disc labeled the L5-S1 level. Alignment: Mild levoscoliosis with apex at L4. Trace retrolisthesis of L3 on L4 and L5 on S1. Vertebrae: Vertebral body heights maintained without evidence for acute or chronic fracture. No discrete lytic or blastic  osseous lesions. Visualized sacrum and pelvis intact. SI joints approximated and symmetric. Paraspinal and other soft tissues: Paraspinous soft tissues demonstrate no acute finding. Disc levels: L1-2:  Negative interspace.  Mild facet hypertrophy.  No stenosis. L2-3: Mild disc bulge. Mild bilateral facet hypertrophy. No significant stenosis. L3-4: Mild diffuse disc bulge with bilateral facet hypertrophy. Perhaps mild spinal stenosis. Mild bilateral L3 foraminal narrowing. L4-5: Chronic intervertebral disc space narrowing with diffuse disc bulge and disc desiccation. Reactive endplate changes with marginal endplate osteophytic spurring on the right. Posterior disc osteophyte flattens the ventral thecal sac and encroaches upon the lateral recess. Right greater than left facet hypertrophy. Resultant moderate canal with moderate to severe right greater than left lateral recess narrowing. Mild left with severe right L4 foraminal stenosis. L5-S1: Mild diffuse disc bulge with disc desiccation. Right greater left facet hypertrophy. Disc bulge asymmetric to the left, closely approximating the descending S1 nerve roots without frank impingement or displacement. Moderate left with mild right L5 foraminal stenosis. IMPRESSION: CT ABDOMEN AND PELVIS IMPRESSION 1. No CT evidence for nephrolithiasis or obstructive uropathy. 2. Mild circumferential bladder wall thickening with mild surrounding hazy stranding. While this finding may in part be related incomplete distension, possible acute cystitis could also have this appearance. Correlation with urinalysis recommended. 3. Mild hyperdensity within the gallbladder lumen, suggesting internal sludge. Question surrounding mild hazy pericholecystic stranding. Correlation with laboratory values recommended. Additionally, further assessment with dedicated right upper quadrant ultrasound could be performed as clinically warranted. 4. No other acute intra-abdominal or pelvic process. 5.  Extensive aorto bi-iliac atherosclerotic disease with aortic stent endograft in place. Stable infrarenal and right common iliac artery aneurysms. CT LUMBAR SPINE IMPRESSION 1. No acute abnormality within the lumbar spine. 2. Degenerative spondylolysis with facet hypertrophy at L4-5 with resultant moderate to severe bilateral lateral recess narrowing, potentially affecting either of the descending L5 nerve roots. 3. Disc bulge at L5-S1, closely approximating and potentially irritating either of the descending S1 nerve roots. 4. Multifactorial degenerative changes with resultant severe right L4 and moderate left L5 foraminal stenosis. Electronically Signed   By: Jeannine Boga M.D.   On: 05/10/2018 19:40   US Abdomen Limited Ruq  Result Date: 05/10/2018 CLINICAL DATA:  Initial evaluation for acute abdominal pain. EXAM: ULTRASOUND ABDOMEN LIMITED RIGHT UPPER QUADRANT COMPARISON:  None. FINDINGS: Gallbladder: No gallstones or wall thickening visualized. No sonographic Murphy sign noted by sonographer. Common bile duct: Diameter: 3.2 mm Liver: No focal lesion identified. Increased hepatic echogenicity, compatible with steatosis. Portal vein is patent on color Doppler imaging with normal direction of blood flow towards the liver. IMPRESSION: 1. Normal sonographic evaluation of the gallbladder. No cholelithiasis, evidence for acute cholecystitis, or biliary dilatation. Previously questioned abnormality on prior CT most likely related to incomplete distension. 2. Hepatic steatosis. Electronically Signed   By: Pincus Badder.D.  On: 05/10/2018 22:35    EKG: Independently reviewed.  Sinus rhythm at 84 bpm with low voltage  Assessment/Plan Acute encephalopathy with hallucinations, unspecified: Acute.  Patient presents with visual hallucinations and confusion.  Notes recent stoppage of alcohol use over Labor Day weekend.  CT imaging of the brain showed no acute abnormalities.  Recently treated with  Augmentin for UTI.  Question if symptoms related to recent stoppage of alcohol use, medication, and/or infection.  - Admit to a MedSurg bed - Follow-up blood cultures - Neurochecks - Check TSH - Question need of further imaging of the brain with MRI and/or consult to psychiatry  History of alcohol abuse with liver disease: Patient reports abrupt stoppage of alcohol over the Labor Day weekend.  Imaging of abdomen revealed hepatic steatosis without signs of cholecystitis or cholelithiasis.  Patient's initial symptoms appear to possibly have been related to withdrawal from alcohol. - CWIA protocols with scheduled Ativan - High-dose thiamine 500 mg 3 times daily  Hyponatremia: Acute.  Patient presents with a sodium of 127.  Patient with low chloride levels as well Question of symptoms secondary to decreased oral intake or related with history of alcohol abuse. - Check urine sodium, urine osmolarity, serum osmolarity - Normal saline IV fluids at 75 mL/h - Recheck CBC BMP in a.m.  - adjust fluids as needed  Recent urinary tract infection: Patient had recently been diagnosed with a urinary tract infection for which she is been treated with Augmentin. Initial UA showed no signs of infection.  However CT scan of the abdomen and pelvis revealed signs of bladder wall thickening concerning for acute cystitis. - Held Augmentin  - Rocephin IV  Anemia of chronic disease: Hemoglobin appears stable at 10.7.  Patient denies any reports of bleeding - Continue to monitor  Scalp rash, history of skin cancer: Patient reports having some rash/acne of scalp for which he is followed by [place] [facility] and had been on Bactrim for treatment. - Held Bactrim, restart when medically appropriate - Continue outpatient follow-up with dermatology  Hypokalemia: Acute initial potassium 3.4 on admission. 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 Bobette Mo on 01/13/2018. - Continue outpatient follow-up  DVT prophylaxis: lovenox  Code Status: Full  Family Communication: Discussed plan of care with the patient family present bedside Disposition Plan: To be determined Consults called:none Admission status: obnservation  Norval Morton MD [facility] Pager [phone]   If 7PM-7AM, please contact night-coverage [email or web address] Password [email or web address]  05/11/2018, 1:34 AM

## 2018-05-12 LAB — COMPREHENSIVE METABOLIC PANEL
ALK PHOS: 75 U/L (ref 38–126)
ALT: 28 U/L (ref 0–44)
ANION GAP: 13 (ref 5–15)
AST: 40 U/L (ref 15–41)
Albumin: 3.1 g/dL — ABNORMAL LOW (ref 3.5–5.0)
BUN: 6 mg/dL — ABNORMAL LOW (ref 8–23)
CALCIUM: 9 mg/dL (ref 8.9–10.3)
CO2: 21 mmol/L — AB (ref 22–32)
CREATININE: 0.78 mg/dL (ref 0.61–1.24)
Chloride: 101 mmol/L (ref 98–111)
GFR calc non Af Amer: >60 mL/min (ref >60)
Glucose, Bld: 79 mg/dL (ref 70–99)
Potassium: 3.3 mmol/L — ABNORMAL LOW (ref 3.5–5.1)
SODIUM: 135 mmol/L (ref 135–145)
TOTAL PROTEIN: 6.9 g/dL (ref 6.5–8.1)
Total Bilirubin: 0.9 mg/dL (ref 0.3–1.2)

## 2018-05-12 LAB — CBC WITH DIFFERENTIAL/PLATELET
Basophils Absolute: 0 10*3/uL (ref 0.0–0.1)
Basophils Relative: 1 %
EOS ABS: 0.3 10*3/uL (ref 0.0–0.7)
EOS PCT: 6 %
HCT: 30.7 % — ABNORMAL LOW (ref 39.0–52.0)
HEMOGLOBIN: 10.5 g/dL — AB (ref 13.0–17.0)
LYMPHS ABS: 1 10*3/uL (ref 0.7–4.0)
Lymphocytes Relative: 22 %
MCH: 33.7 pg (ref 26.0–34.0)
MCHC: 34.2 g/dL (ref 30.0–36.0)
MCV: 98.4 fL (ref 78.0–100.0)
MONOS PCT: 13 %
Monocytes Absolute: 0.6 10*3/uL (ref 0.1–1.0)
NEUTROS PCT: 58 %
Neutro Abs: 2.6 10*3/uL (ref 1.7–7.7)
Platelets: 288 10*3/uL (ref 150–400)
RBC: 3.12 MIL/uL — ABNORMAL LOW (ref 4.22–5.81)
RDW: 12.2 % (ref 11.5–15.5)
WBC: 4.4 10*3/uL (ref 4.0–10.5)

## 2018-05-12 LAB — GLUCOSE, CAPILLARY
Glucose-Capillary: 69 mg/dL — ABNORMAL LOW (ref 70–99)
Glucose-Capillary: 81 mg/dL (ref 70–99)
Glucose-Capillary: 100 mg/dL — ABNORMAL HIGH (ref 70–99)

## 2018-05-12 LAB — MAGNESIUM: Magnesium: 1.5 mg/dL — ABNORMAL LOW (ref 1.7–2.4)

## 2018-05-12 LAB — PHOSPHORUS: Phosphorus: 2.7 mg/dL (ref 2.5–4.6)

## 2018-05-12 MED ORDER — VITAMIN B-1 100 MG PO TABS: 100.0000 mg | ORAL_TABLET | Freq: Every day | ORAL | 0 refills | Status: DC

## 2018-05-12 MED ORDER — MAGNESIUM SULFATE 2 GM/50ML IV SOLN
2.0000 g | Freq: Once | INTRAVENOUS | Status: AC
  Administered 2018-05-12: 2 g via INTRAVENOUS
  Filled 2018-05-12: qty 50

## 2018-05-12 MED ORDER — ACETAMINOPHEN ER 650 MG PO TBCR: 1300.0000 mg | EXTENDED_RELEASE_TABLET | Freq: Three times a day (TID) | ORAL | Status: DC | PRN

## 2018-05-12 MED ORDER — POTASSIUM CHLORIDE CRYS ER 20 MEQ PO TBCR
40.0000 meq | EXTENDED_RELEASE_TABLET | Freq: Two times a day (BID) | ORAL | Status: DC
  Administered 2018-05-12: 40 meq via ORAL
  Filled 2018-05-12: qty 2

## 2018-05-12 MED ORDER — FOLIC ACID 1 MG PO TABS: 1.0000 mg | ORAL_TABLET | Freq: Every day | ORAL | 0 refills | Status: DC

## 2018-05-12 NOTE — Evaluation (Signed)
Physical Therapy Evaluation Patient Details Name: Corey Garcia MRN: [id number] DOB: September 05, 1945 Today's Date: 05/12/2018   History of Present Illness  72 yo male admitted 9/10 referred to ED by MD office  for AMS, hallucinations, recently  treated for UTI,. History of alcoholism, CAD, AAA repair  Clinical Impression  The patient is  Mobilizing slowly, speaks slowly. At times some expressive difficulties. Wife is not present. The patient was able to don left knee brace and ambulate x 250' with RW. Patient may benefit from HHPT safety evaluation. Pt admitted with above diagnosis. Pt currently with functional limitations due to the deficits listed below (see PT Problem List).  Pt will benefit from skilled PT to increase their independence and safety with mobility to allow discharge to the venue listed below.       Follow Up Recommendations Home health PT;Outpatient PT    Equipment Recommendations  None recommended by PT    Recommendations for Other Services       Precautions / Restrictions Precautions Precautions: Fall Precaution Comments: has a knee brace for left knee Restrictions Weight Bearing Restrictions: No      Mobility  Bed Mobility Overal bed mobility: Needs Assistance Bed Mobility: Supine to Sit     Supine to sit: Min guard     General bed mobility comments: extra time  Transfers Overall transfer level: Needs assistance Equipment used: Rolling walker (2 wheeled) Transfers: Sit to/from Stand Sit to Stand: Min guard         General transfer comment: cues for safety and hand placement  Ambulation/Gait Ambulation/Gait assistance: Min guard;Min assist Gait Distance (Feet): 250 Feet Assistive device: Rolling walker (2 wheeled) Gait Pattern/deviations: Step-through pattern Gait velocity: decr   General Gait Details: gait is steady with RW, moves slowly  Stairs            Wheelchair Mobility    Modified Rankin (Stroke Patients Only)        Balance                                             Pertinent Vitals/Pain Pain Assessment: Faces Faces Pain Scale: Hurts little more Pain Location: left knee Pain Descriptors / Indicators: Aching Pain Intervention(s): Monitored during session;Repositioned    Home Living Family/patient expects to be discharged to:: Private residence Living Arrangements: Spouse/significant other Available Help at Discharge: Family Type of Home: House Home Access: Stairs to enter Entrance Stairs-Rails: Psychiatric nurse of Steps: 2-5 Home Layout: One level Home Equipment: Environmental consultant - 2 wheels;Cane - single point      Prior Function Level of Independence: Independent with assistive device(s)               Hand Dominance        Extremity/Trunk Assessment        Lower Extremity Assessment Lower Extremity Assessment: Generalized weakness    Cervical / Trunk Assessment Cervical / Trunk Assessment: Normal  Communication   Communication: (slow at times for expressing thoughts.)  Cognition Arousal/Alertness: Awake/alert Behavior During Therapy: WFL for tasks assessed/performed Overall Cognitive Status: No family/caregiver present to determine baseline cognitive functioning Area of Impairment: Memory                   Current Attention Level: Sustained           General Comments: able to don brace, some difficulty  to express self      General Comments      Exercises     Assessment/Plan    PT Assessment Patient needs continued PT services  PT Problem List Decreased strength;Decreased range of motion;Decreased activity tolerance;Decreased balance;Decreased mobility;Decreased knowledge of precautions;Decreased safety awareness;Decreased knowledge of use of DME;Decreased cognition       PT Treatment Interventions DME instruction;Gait training;Stair training;Functional mobility training;Therapeutic activities;Therapeutic  exercise;Patient/family education    PT Goals (Current goals can be found in the Care Plan section)  Acute Rehab PT Goals Patient Stated Goal: to go home PT Goal Formulation: With patient Time For Goal Achievement: 05/26/18 Potential to Achieve Goals: Good    Frequency Min 3X/week   Barriers to discharge        Co-evaluation               AM-PAC PT "6 Clicks" Daily Activity  Outcome Measure Difficulty turning over in bed (including adjusting bedclothes, sheets and blankets)?: A Little Difficulty moving from lying on back to sitting on the side of the bed? : A Little Difficulty sitting down on and standing up from a chair with arms (e.g., wheelchair, bedside commode, etc,.)?: A Little Help needed moving to and from a bed to chair (including a wheelchair)?: A Little Help needed walking in hospital room?: A Little Help needed climbing 3-5 steps with a railing? : Total 6 Click Score: 16    End of Session Equipment Utilized During Treatment: Gait belt Activity Tolerance: Patient tolerated treatment well Patient left: in chair;with chair alarm set;with call bell/phone within reach Nurse Communication: Mobility status PT Visit Diagnosis: Unsteadiness on feet (R26.81)    Time: 1950-9326 PT Time Calculation (min) (ACUTE ONLY): 24 min   Charges:   PT Evaluation $PT Eval Low Complexity: 1 Low PT Treatments $Gait Training: 8-22 mins        Tresa Endo PT Acute Rehabilitation Services Pager [phone] Office [phone]   Claretha Cooper 05/12/2018, 10:48 AM

## 2018-05-12 NOTE — Evaluation (Signed)
Occupational Therapy Evaluation Patient Details Name: Corey Garcia MRN: [id number] DOB: 1945-12-17 Today's Date: 05/12/2018    History of Present Illness 72 yo male admitted 9/10 referred to ED by MD office  for AMS, hallucinations, recently  treated for UTI,. History of alcoholism, CAD, AAA repair   Clinical Impression   Pt was admitted for the above. Pt is mod I at baseline, using a cane. He currently needs mostly min guard with occasional min A.  Will follow in acute setting with mod I level goals.    Follow Up Recommendations  Supervision - Intermittent    Equipment Recommendations  None recommended by OT    Recommendations for Other Services       Precautions / Restrictions Precautions Precautions: Fall Precaution Comments: has a knee brace for left knee Restrictions Weight Bearing Restrictions: No      Mobility Bed Mobility Overal bed mobility: Needs Assistance Bed Mobility: Supine to Sit     Supine to sit: Min guard     General bed mobility comments: extra time  Transfers Overall transfer level: Needs assistance Equipment used: Rolling walker (2 wheeled) Transfers: Sit to/from Stand Sit to Stand: Min guard         General transfer comment: cues for safety and hand placement    Balance                                           ADL either performed or assessed with clinical judgement   ADL Overall ADL's : Needs assistance/impaired             Lower Body Bathing: Min guard;Sit to/from stand       Lower Body Dressing: Minimal assistance;Sit to/from stand   Toilet Transfer: Min guard;Ambulation;Comfort height toilet;RW             General ADL Comments: pt needs mostly set up for UB and min guard for safety when ambulating.  Min A for LB dressing:  had difficulty with socks sticking to underwear.  Pt steady with walker. He is used to a Marketing executive      Pertinent  Vitals/Pain Pain Assessment: Faces Faces Pain Scale: Hurts little more Pain Location: left knee Pain Descriptors / Indicators: Aching Pain Intervention(s): Limited activity within patient's tolerance;Monitored during session;Premedicated before session;Repositioned     Hand Dominance     Extremity/Trunk Assessment Upper Extremity Assessment Upper Extremity Assessment: Generalized weakness   Lower Extremity Assessment Lower Extremity Assessment: Generalized weakness   Cervical / Trunk Assessment Cervical / Trunk Assessment: Normal   Communication Communication Communication: (a little bit of difficulty with word finding)   Cognition Arousal/Alertness: Awake/alert Behavior During Therapy: WFL for tasks assessed/performed Overall Cognitive Status: No family/caregiver present to determine baseline cognitive functioning Area of Impairment: Memory                   Current Attention Level: Sustained           General Comments: asked about clothes a couple of times; oriented to place and time   General Comments       Exercises     Shoulder Instructions      Home Living Family/patient expects to be discharged to:: Private residence Living Arrangements: Spouse/significant other Available Help at Discharge: Family  Type of Home: House Home Access: Stairs to enter [employer] of Steps: 2-5 Entrance Stairs-Rails: Right;Left Home Layout: One level         Biochemist, clinical: Standard     Home Equipment: Environmental consultant - 2 wheels;Cane - single point          Prior Functioning/Environment Level of Independence: Independent with assistive device(s)                 OT Problem List: Decreased strength;Decreased activity tolerance;Impaired balance (sitting and/or standing);Pain;Decreased knowledge of use of DME or AE      OT Treatment/Interventions: Self-care/ADL training;Patient/family education;Balance training;DME and/or AE instruction    OT  Goals(Current goals can be found in the care plan section) Acute Rehab OT Goals Patient Stated Goal: to go home OT Goal Formulation: With patient Time For Goal Achievement: 05/19/18 Potential to Achieve Goals: Good ADL Goals Pt Will Transfer to Toilet: with modified independence;regular height toilet;ambulating Pt Will Perform Tub/Shower Transfer: with modified independence Additional ADL Goal #1: pt will gather clothes and complete ADL at mod I level  OT Frequency: Min 2X/week   Barriers to D/C:            Co-evaluation              AM-PAC PT "6 Clicks" Daily Activity     Outcome Measure Help from another person eating meals?: None Help from another person taking care of personal grooming?: A Little Help from another person toileting, which includes using toliet, bedpan, or urinal?: A Little Help from another person bathing (including washing, rinsing, drying)?: A Little Help from another person to put on and taking off regular upper body clothing?: A Little Help from another person to put on and taking off regular lower body clothing?: A Little 6 Click Score: 19   End of Session    Activity Tolerance: Patient tolerated treatment well Patient left: in chair;with call bell/phone within reach;with chair alarm set  OT Visit Diagnosis: Muscle weakness (generalized) (M62.81)                Time: 0865-7846 OT Time Calculation (min): 17 min Charges:  OT General Charges $OT Visit: 1 Visit OT Evaluation $OT Eval Low Complexity: [place], OTR/L Acute Rehabilitation Services [phone] [facility] pager [phone] office 05/12/2018  Corey Garcia 05/12/2018, 11:04 AM

## 2018-05-12 NOTE — Discharge Summary (Signed)
Physician Discharge Summary  Corey Garcia WPY:[id number] DOB: [birth year] DOA: 05/10/2018  PCP: [name], MD  Admit date: 05/10/2018 Discharge date: 05/12/2018  Admitted From: Home Disposition: Home with Home Health  Recommendations for Outpatient Follow-up:  1. Follow up with PCP in 1-2 weeks 2. Follow-up with Hematology within 1 to 2 weeks 3. Follow-up with Gastroenterology in outpatient setting for abnormal taste in mouth 4. Please obtain CMP/CBC, Mag, Phos in one week 5. Please follow up on the following pending results: Urine Cx result  Home Health: Yes Equipment/Devices: None  Discharge Condition: Stable  CODE STATUS: FULL CODE Diet recommendation: Heart Healthy Diet   Brief/Interim Summary: The patient is a 72 year old Caucasian male with a past medical history significant for hypertension, hyperlipidemia, CAD, history of AAA status post repair, history of chronic alcoholism, hemochromatosis and other comorbidities who presented with visual hallucinations for at least 1 week.  He was admitted with acute encephalopathy with hallucinations likely secondary to possible alcohol withdrawal versus medication induced versus other etiology.  Suspected that is from hyponatremia and hyponatremia corrected and patient's mental status improved. We will treat the patient as if he had a urinary tract infection given his thickening concern for acute cystitis and confusion however he did receive 5 days of p.o. Augmentin and 2 days of IV ceftriaxone so we will stop patient improved and he was deemed medically stable to be discharged and PT evaluated him recommended home health PT.    Discharge Diagnoses:  Principal Problem:   Hallucinations, unspecified Active Problems:   Essential hypertension   Alcohol abuse   Anemia of chronic illness   Hyponatremia   BPH (benign prostatic hyperplasia)  Acute encephalopathy with hallucinations, unspecified; suspect 2/2 to Hyponatremia -Acute.   Patient presents with visual hallucinations and confusion.  Notes recent stoppage of alcohol use over Labor Day weekend and states he hasn't drank all of September.   -CT imaging of the brain showed no acute abnormalities.  Recently treated with Augmentin for UTI.  Question if symptoms related to recent stoppage of alcohol use, medication, and/or infection but suspect electrolyte derangements were the cause - Admit to a MedSurg bed - Follow-up blood cultures; NGTD at 2 days  - Neurochecks - Check TSH - Question need of further imaging of the brain with MRI and/or consult to psychiatry but he improved so will not purse -Follow up with PCP   History of alcohol abuse with liver disease:  -Patient reports abrupt stoppage of alcohol over the Labor Day weekend.  Imaging of abdomen revealed hepatic steatosis without signs of cholecystitis or cholelithiasis.  Patient's initial symptoms appeared to possibly have been related to withdrawal from alcohol but Hallucinations likely attributed to Hyponatremia - CWIA protocols with scheduled Ativan - High-dose thiamine 500 mg 3 times daily and will write for D/C Folic Acid and Thiamine   Hyponatremia:  -Acute.  Patient presents with a sodium of 127. -Patient with low chloride levels as well Question of symptoms secondary to decreased oral intake or related with history of alcohol abuse. *Patient admits to having poor oral intake but has been drinking a significant amount of water -Checked urine sodium, urine osmolarity, serum osmolarity -Normal saline IV fluids at 75 mL/h until D/C -Repeat CMP showed Na+ of 135 -Follow up with PCP   Recent urinary tract infection:  -Patient had recently been diagnosed with a urinary tract infection for which she is been treated with Augmentin. Initial UA showed no signs of infection. However CT scan of  the abdomen and pelvis revealed signs of bladder wall thickening concerning for acute cystitis. - Held Augmentin  -  Rocephin IV x 2 days - Will not give any further Abx as patient is not symptomatic and has had a total of 7 days total - Defer to PCP if they Abx to be continued   Anemia of Chronic Disease:  -Hemoglobin appears stable at 10.5.   -Patient denies any reports of bleeding - Continue to monitor  Scalp rash, history of skin cancer:  -Patient reports having some rash/acne of scalp for which he is followed by [facility] and had been on Bactrim for treatment. - Held Bactrim during admission but ok to restart at D/C - Continue outpatient follow-up with dermatology   Hypokalemia - 3.3 - Give 40 mEq of potassium chloride po BID x2 - Continue to monitor and replace as needed  HypoMagnesemia -Mag Level Replete prior to D/C  Essential Hypertension - Continue Bisoprolol and Amlodipine  BPH  - Continue Flomax  GERD - Continue Protonix  AAA -Patient status post repair of a saccular abdominal aortic aneurysm by Dr. Bobette Mo on 01/13/2018. - Continue outpatient follow-up has appointment scheduled in January  Hemochromatosis -Follow-up with hematology Dr. Simeon Craft such in the outpatient setting  Altered taste -Has been going on for a few months -Follow-up with gastroenterology in the outpatient setting  Discharge Instructions  Discharge Instructions    Call MD for:  difficulty breathing, headache or visual disturbances   Complete by:  As directed    Call MD for:  extreme fatigue   Complete by:  As directed    Call MD for:  hives   Complete by:  As directed    Call MD for:  persistant dizziness or light-headedness   Complete by:  As directed    Call MD for:  persistant nausea and vomiting   Complete by:  As directed    Call MD for:  redness, tenderness, or signs of infection (pain, swelling, redness, odor or green/yellow discharge around incision site)   Complete by:  As directed    Call MD for:  severe uncontrolled pain   Complete by:  As directed    Call MD for:   temperature >100.4   Complete by:  As directed    Diet - low sodium heart healthy   Complete by:  As directed    Discharge instructions   Complete by:  As directed    You were cared for by a hospitalist during your hospital stay. If you have any questions about your discharge medications or the care you received while you were in the hospital after you are discharged, you can call the unit and ask to speak with the hospitalist on call if the hospitalist that took care of you is not available. Once you are discharged, your primary care physician will handle any further medical issues. Please note that NO REFILLS for any discharge medications will be authorized once you are discharged, as it is imperative that you return to your primary care physician (or establish a relationship with a primary care physician if you do not have one) for your aftercare needs so that they can reassess your need for medications and monitor your lab values.  Follow up with PCP, Hematology, and Gastroenterology in the outpatient setting. Take all medications as prescribed. If symptoms change or worsen please return to the ED for evaluation   Increase activity slowly   Complete by:  As directed  Allergies as of 05/12/2018      Reactions   Lisinopril Other (See Comments)   PHOTOSENSITIVITY ON ARMS   Minocin [minocycline Hcl]    Flank pain   Thiazide-type Diuretics Other (See Comments)   "Lowered my blood pressure"   Codeine Nausea Only      Medication List    STOP taking these medications   amoxicillin-clavulanate 875-125 MG tablet Commonly known as:  AUGMENTIN   oxyCODONE 5 MG immediate release tablet Commonly known as:  Oxy IR/ROXICODONE     TAKE these medications   acetaminophen 650 MG CR tablet Commonly known as:  TYLENOL Take 2 tablets (1,300 mg total) by mouth every 8 (eight) hours as needed for pain. What changed:    when to take this  reasons to take this   amLODipine 2.5 MG  tablet Commonly known as:  NORVASC Take 1 tablet (2.5 mg total) by mouth daily.   aspirin 81 MG tablet Take 81 mg by mouth daily.   atorvastatin 20 MG tablet Commonly known as:  LIPITOR Take 20 mg by mouth daily.   bisoprolol 10 MG tablet Commonly known as:  ZEBETA Take 15 mg by mouth daily. Take 1.5 tablets by mouth daily.   cholecalciferol 1000 units tablet Commonly known as:  VITAMIN D Take 1,000 Units by mouth daily.   folic acid 1 MG tablet Commonly known as:  FOLVITE Take 1 tablet (1 mg total) by mouth daily. Start taking on:  05/13/2018   loratadine 10 MG tablet Commonly known as:  CLARITIN Take 1 tablet by mouth daily.   ONE DAILY MULTIVITAMIN MEN PO Take 1 tablet by mouth daily.   PROBIOTIC PO Take 1 tablet by mouth daily.   PROCTO-MED HC 2.5 % rectal cream Generic drug:  hydrocortisone Place 1 application rectally as needed for hemorrhoids or itching.   PROTONIX 40 MG tablet Generic drug:  pantoprazole Take 40 mg by mouth daily.   sulfamethoxazole-trimethoprim 800-160 MG tablet Commonly known as:  BACTRIM DS,SEPTRA DS Take 1 tablet by mouth 2 (two) times daily.   tacrolimus 0.03 % ointment Commonly known as:  PROTOPIC Apply 1 application topically daily as needed (dermatitis on face).   tamsulosin 0.4 MG Caps capsule Commonly known as:  FLOMAX Take 0.4 mg by mouth daily.   thiamine 100 MG tablet Commonly known as:  VITAMIN B-1 Take 1 tablet (100 mg total) by mouth daily.      Follow-up Information    [name], MD Follow up.   Specialty:  Hematology and Oncology Contact information: [place] [address] [phone]          Allergies  Allergen Reactions  . Lisinopril Other (See Comments)    PHOTOSENSITIVITY ON ARMS  . Minocin [Minocycline Hcl]     Flank pain  . Thiazide-Type Diuretics Other (See Comments)    "Lowered my blood pressure"  . Codeine Nausea Only    Consultations:  None  Procedures/Studies: Dg Chest 2 View  Result Date: 05/10/2018 CLINICAL DATA:  Initial evaluation for acute altered mental status. EXAM: CHEST - 2 VIEW COMPARISON:  Prior radiograph from 01/13/2018 FINDINGS: Cardiac and mediastinal silhouettes are stable in size and contour, and remain within normal limits. Aortic atherosclerosis. Lungs normally inflated. Mild scattered subsegmental atelectasis seen involving the lung bases bilaterally. No consolidative airspace opacity. No pulmonary edema or pleural effusion. No pneumothorax. Nodular density overlying the mid right lung base favored to reflect a nipple shadow. No acute osseous abnormality. IMPRESSION:  1. Mild bibasilar subsegmental atelectasis. No other active cardiopulmonary disease. 2. Aortic atherosclerosis. Electronically Signed   By: Jeannine Boga M.D.   On: 05/10/2018 19:48   Ct Head Wo Contrast  Result Date: 05/10/2018 CLINICAL DATA:  Initial evaluation for acute headache, hallucinations. EXAM: CT HEAD WITHOUT CONTRAST TECHNIQUE: Contiguous axial images were obtained from the base of the skull through the vertex without intravenous contrast. COMPARISON:  None. FINDINGS: Brain: Generalized age-related cerebral atrophy with mild chronic small vessel ischemic disease. No acute intracranial hemorrhage. No acute large vessel territory infarct. No mass lesion, midline shift or mass effect. No hydrocephalus. No extra-axial fluid collection. Vascular: No hyperdense vessel. Scattered vascular calcifications noted within the carotid siphons. Skull: Scalp soft tissues and calvarium within normal limits. Sinuses/Orbits: Globes orbital soft tissues normal. Visualized paranasal sinuses are clear. No mastoid effusion. Other: None. IMPRESSION: 1. No acute intracranial abnormality. 2. Age-related cerebral atrophy with mild chronic small vessel ischemic disease. Electronically Signed   By: Jeannine Boga M.D.   On: 05/10/2018  19:46   Ct L-spine No Charge  Result Date: 05/10/2018 CLINICAL DATA:  Initial evaluation for acute intermittent bilateral flank pain. EXAM: CT ABDOMEN AND PELVIS WITHOUT CONTRAST CT LUMBAR SPINE WITHOUT CONTRAST TECHNIQUE: Multidetector CT imaging of the abdomen and pelvis was performed following the standard protocol without IV contrast. COMPARISON:  Prior CT from 02/22/2018 FINDINGS: CT ABDOMEN AND PELVIS Lower chest: Visualized lung bases are clear. Hepatobiliary: Limited noncontrast evaluation of the liver is unremarkable. Mild vague hyperdensity within the gallbladder lumen, which could reflect internal sludge. Question of minimal hazy stranding about the gallbladder itself, which may reflect mild inflammation. No biliary dilatation. Pancreas: Pancreas within normal limits. Spleen: Spleen within normal limits. Adrenals/Urinary Tract: Adrenal glands are normal. Kidneys equal in size without nephrolithiasis or hydronephrosis. Bilateral perinephric stranding similar to previous, likely chronic. No radiopaque calculi seen along the course of either renal collecting system. No hydroureter. No layering stones within the bladder lumen. Mild circumferential bladder wall thickening with surrounding hazy stranding, which could be related incomplete distension or possibly acute cystitis. Stomach/Bowel: Small hiatal hernia noted. Stomach otherwise unremarkable. No evidence for bowel obstruction. Normal appendix. No acute inflammatory changes seen about the bowels. Vascular/Lymphatic: Extensive aorto bi-iliac atherosclerotic disease. Aortic stent endograft in place, grossly stable from previous. Aneurysmal dilatation of the infrarenal aorta up to 3.4 cm is stable from previous. Additional right common iliac artery aneurysm measuring up to 2.6 cm also unchanged. No adenopathy. Reproductive: Prostate normal. Other: Small fat containing bilateral inguinal hernias noted, slightly larger on the left. No free air or fluid.  Musculoskeletal: No acute osseous abnormality. No worrisome lytic or blastic osseous lesions. CT LUMBAR SPINE Segmentation: Normal segmentation. Lowest well-formed disc labeled the L5-S1 level. Alignment: Mild levoscoliosis with apex at L4. Trace retrolisthesis of L3 on L4 and L5 on S1. Vertebrae: Vertebral body heights maintained without evidence for acute or chronic fracture. No discrete lytic or blastic osseous lesions. Visualized sacrum and pelvis intact. SI joints approximated and symmetric. Paraspinal and other soft tissues: Paraspinous soft tissues demonstrate no acute finding. Disc levels: L1-2:  Negative interspace.  Mild facet hypertrophy.  No stenosis. L2-3: Mild disc bulge. Mild bilateral facet hypertrophy. No significant stenosis. L3-4: Mild diffuse disc bulge with bilateral facet hypertrophy. Perhaps mild spinal stenosis. Mild bilateral L3 foraminal narrowing. L4-5: Chronic intervertebral disc space narrowing with diffuse disc bulge and disc desiccation. Reactive endplate changes with marginal endplate osteophytic spurring on the right. Posterior disc osteophyte flattens the  ventral thecal sac and encroaches upon the lateral recess. Right greater than left facet hypertrophy. Resultant moderate canal with moderate to severe right greater than left lateral recess narrowing. Mild left with severe right L4 foraminal stenosis. L5-S1: Mild diffuse disc bulge with disc desiccation. Right greater left facet hypertrophy. Disc bulge asymmetric to the left, closely approximating the descending S1 nerve roots without frank impingement or displacement. Moderate left with mild right L5 foraminal stenosis. IMPRESSION: CT ABDOMEN AND PELVIS IMPRESSION 1. No CT evidence for nephrolithiasis or obstructive uropathy. 2. Mild circumferential bladder wall thickening with mild surrounding hazy stranding. While this finding may in part be related incomplete distension, possible acute cystitis could also have this appearance.  Correlation with urinalysis recommended. 3. Mild hyperdensity within the gallbladder lumen, suggesting internal sludge. Question surrounding mild hazy pericholecystic stranding. Correlation with laboratory values recommended. Additionally, further assessment with dedicated right upper quadrant ultrasound could be performed as clinically warranted. 4. No other acute intra-abdominal or pelvic process. 5. Extensive aorto bi-iliac atherosclerotic disease with aortic stent endograft in place. Stable infrarenal and right common iliac artery aneurysms. CT LUMBAR SPINE IMPRESSION 1. No acute abnormality within the lumbar spine. 2. Degenerative spondylolysis with facet hypertrophy at L4-5 with resultant moderate to severe bilateral lateral recess narrowing, potentially affecting either of the descending L5 nerve roots. 3. Disc bulge at L5-S1, closely approximating and potentially irritating either of the descending S1 nerve roots. 4. Multifactorial degenerative changes with resultant severe right L4 and moderate left L5 foraminal stenosis. Electronically Signed   By: Jeannine Boga M.D.   On: 05/10/2018 19:40   Ct Renal Stone Study  Result Date: 05/10/2018 CLINICAL DATA:  Initial evaluation for acute intermittent bilateral flank pain. EXAM: CT ABDOMEN AND PELVIS WITHOUT CONTRAST CT LUMBAR SPINE WITHOUT CONTRAST TECHNIQUE: Multidetector CT imaging of the abdomen and pelvis was performed following the standard protocol without IV contrast. COMPARISON:  Prior CT from 02/22/2018 FINDINGS: CT ABDOMEN AND PELVIS Lower chest: Visualized lung bases are clear. Hepatobiliary: Limited noncontrast evaluation of the liver is unremarkable. Mild vague hyperdensity within the gallbladder lumen, which could reflect internal sludge. Question of minimal hazy stranding about the gallbladder itself, which may reflect mild inflammation. No biliary dilatation. Pancreas: Pancreas within normal limits. Spleen: Spleen within normal limits.  Adrenals/Urinary Tract: Adrenal glands are normal. Kidneys equal in size without nephrolithiasis or hydronephrosis. Bilateral perinephric stranding similar to previous, likely chronic. No radiopaque calculi seen along the course of either renal collecting system. No hydroureter. No layering stones within the bladder lumen. Mild circumferential bladder wall thickening with surrounding hazy stranding, which could be related incomplete distension or possibly acute cystitis. Stomach/Bowel: Small hiatal hernia noted. Stomach otherwise unremarkable. No evidence for bowel obstruction. Normal appendix. No acute inflammatory changes seen about the bowels. Vascular/Lymphatic: Extensive aorto bi-iliac atherosclerotic disease. Aortic stent endograft in place, grossly stable from previous. Aneurysmal dilatation of the infrarenal aorta up to 3.4 cm is stable from previous. Additional right common iliac artery aneurysm measuring up to 2.6 cm also unchanged. No adenopathy. Reproductive: Prostate normal. Other: Small fat containing bilateral inguinal hernias noted, slightly larger on the left. No free air or fluid. Musculoskeletal: No acute osseous abnormality. No worrisome lytic or blastic osseous lesions. CT LUMBAR SPINE Segmentation: Normal segmentation. Lowest well-formed disc labeled the L5-S1 level. Alignment: Mild levoscoliosis with apex at L4. Trace retrolisthesis of L3 on L4 and L5 on S1. Vertebrae: Vertebral body heights maintained without evidence for acute or chronic fracture. No discrete lytic or blastic  osseous lesions. Visualized sacrum and pelvis intact. SI joints approximated and symmetric. Paraspinal and other soft tissues: Paraspinous soft tissues demonstrate no acute finding. Disc levels: L1-2:  Negative interspace.  Mild facet hypertrophy.  No stenosis. L2-3: Mild disc bulge. Mild bilateral facet hypertrophy. No significant stenosis. L3-4: Mild diffuse disc bulge with bilateral facet hypertrophy. Perhaps mild  spinal stenosis. Mild bilateral L3 foraminal narrowing. L4-5: Chronic intervertebral disc space narrowing with diffuse disc bulge and disc desiccation. Reactive endplate changes with marginal endplate osteophytic spurring on the right. Posterior disc osteophyte flattens the ventral thecal sac and encroaches upon the lateral recess. Right greater than left facet hypertrophy. Resultant moderate canal with moderate to severe right greater than left lateral recess narrowing. Mild left with severe right L4 foraminal stenosis. L5-S1: Mild diffuse disc bulge with disc desiccation. Right greater left facet hypertrophy. Disc bulge asymmetric to the left, closely approximating the descending S1 nerve roots without frank impingement or displacement. Moderate left with mild right L5 foraminal stenosis. IMPRESSION: CT ABDOMEN AND PELVIS IMPRESSION 1. No CT evidence for nephrolithiasis or obstructive uropathy. 2. Mild circumferential bladder wall thickening with mild surrounding hazy stranding. While this finding may in part be related incomplete distension, possible acute cystitis could also have this appearance. Correlation with urinalysis recommended. 3. Mild hyperdensity within the gallbladder lumen, suggesting internal sludge. Question surrounding mild hazy pericholecystic stranding. Correlation with laboratory values recommended. Additionally, further assessment with dedicated right upper quadrant ultrasound could be performed as clinically warranted. 4. No other acute intra-abdominal or pelvic process. 5. Extensive aorto bi-iliac atherosclerotic disease with aortic stent endograft in place. Stable infrarenal and right common iliac artery aneurysms. CT LUMBAR SPINE IMPRESSION 1. No acute abnormality within the lumbar spine. 2. Degenerative spondylolysis with facet hypertrophy at L4-5 with resultant moderate to severe bilateral lateral recess narrowing, potentially affecting either of the descending L5 nerve roots. 3. Disc  bulge at L5-S1, closely approximating and potentially irritating either of the descending S1 nerve roots. 4. Multifactorial degenerative changes with resultant severe right L4 and moderate left L5 foraminal stenosis. Electronically Signed   By: Jeannine Boga M.D.   On: 05/10/2018 19:40   US Abdomen Limited Ruq  Result Date: 05/10/2018 CLINICAL DATA:  Initial evaluation for acute abdominal pain. EXAM: ULTRASOUND ABDOMEN LIMITED RIGHT UPPER QUADRANT COMPARISON:  None. FINDINGS: Gallbladder: No gallstones or wall thickening visualized. No sonographic Murphy sign noted by sonographer. Common bile duct: Diameter: 3.2 mm Liver: No focal lesion identified. Increased hepatic echogenicity, compatible with steatosis. Portal vein is patent on color Doppler imaging with normal direction of blood flow towards the liver. IMPRESSION: 1. Normal sonographic evaluation of the gallbladder. No cholelithiasis, evidence for acute cholecystitis, or biliary dilatation. Previously questioned abnormality on prior CT most likely related to incomplete distension. 2. Hepatic steatosis. Electronically Signed   By: Jeannine Boga M.D.   On: 05/10/2018 22:35    Subjective: Seen and examined at bedside and patient is much more alert and awake.  Denies chest pain, shortness breath, nausea, vomiting.  States that his main complaint was food not tasting as well and that states that he had a bad taste in his mouth for a few weeks.  No other concerns or complaints at this time and is ready to go home   Discharge Exam: Vitals:   05/12/18 1018 05/12/18 1209  BP: 126/70 130/66  Pulse: 73 71  Resp: 13 14  Temp:  98.2 F (36.8 C)  SpO2: 99% 100%   Vitals:  05/12/18 0530 05/12/18 0632 05/12/18 1018 05/12/18 1209  BP: (!) 161/79 138/68 126/70 130/66  Pulse: 81 79 73 71  Resp:   13 14  Temp: 100.2 F (37.9 C) 98 F (36.7 C)  98.2 F (36.8 C)  TempSrc: Oral Oral  Oral  SpO2: 98% 95% 99% 100%  Weight:      Height:        General: Pt is alert, awake, not in acute distress Cardiovascular: RRR, S1/S2 +, no rubs, no gallops Respiratory: CTA bilaterally, no wheezing, no rhonchi Abdominal: Soft, NT, ND, bowel sounds + Extremities: no edema, no cyanosis  The results of significant diagnostics from this hospitalization (including imaging, microbiology, ancillary and laboratory) are listed below for reference.    Microbiology: Recent Results (from the past 240 hour(s))  Blood Cultures (routine x 2)     Status: None (Preliminary result)   Collection Time: 05/10/18  6:10 PM  Result Value Ref Range Status   Specimen Description   Final    BLOOD RIGHT ANTECUBITAL Performed at [facility] [address] [place], [place] [address]    Special Requests   Final    BOTTLES DRAWN AEROBIC AND ANAEROBIC Blood Culture adequate volume Performed at [facility] [address] [place], [place] [address]    Culture   Final    NO GROWTH 2 DAYS Performed at [facility] [address], [place], [place] [address]    Report Status PENDING  Incomplete    Labs: BNP (last 3 results) No results for input(s): BNP in the last 8760 hours. Basic Metabolic Panel: Recent Labs  Lab 05/10/18 1812 05/10/18 1822 05/11/18 0258 05/12/18 0430  NA 127* 123* 128* 135  K 3.4* 3.5 3.5 3.3*  CL 92* 91* 95* 101  CO2 20*  --  17* 21*  GLUCOSE 74 71 64* 79  BUN 11 10 10  6*  CREATININE 1.12 1.00 1.05 0.78  CALCIUM 8.9  --  8.7* 9.0  MG  --   --   --  1.5*  PHOS  --   --   --  2.7   Liver Function Tests: Recent Labs  Lab 05/10/18 1812 05/12/18 0430  AST 40 40  ALT 28 28  ALKPHOS 92 75  BILITOT 0.9 0.9  PROT 7.4 6.9  ALBUMIN 3.5 3.1*   No results for input(s): LIPASE, AMYLASE in the last 168 hours. Recent Labs  Lab 05/10/18 1812  AMMONIA 16   CBC: Recent Labs  Lab 05/10/18 1812 05/10/18 1822 05/11/18 0258 05/12/18 0430  WBC 5.4  --  5.9 4.4  NEUTROABS 3.9  --   --   2.6  HGB 10.7* 11.2* 10.1* 10.5*  HCT 31.2* 33.0* 29.6* 30.7*  MCV 98.1  --  99.3 98.4  PLT 228  --  369 288   Cardiac Enzymes: No results for input(s): CKTOTAL, CKMB, CKMBINDEX, TROPONINI in the last 168 hours. BNP: Invalid input(s): POCBNP CBG: Recent Labs  Lab 05/11/18 1156 05/11/18 1606 05/11/18 2354 05/12/18 0552 05/12/18 0832  GLUCAP 81 92 90 69* 81   D-Dimer No results for input(s): DDIMER in the last 72 hours. Hgb A1c No results for input(s): HGBA1C in the last 72 hours. Lipid Profile No results for input(s): CHOL, HDL, LDLCALC, TRIG, CHOLHDL, LDLDIRECT in the last 72 hours. Thyroid function studies Recent Labs    05/11/18 0258  TSH 3.513   Anemia work up No results for input(s): VITAMINB12, FOLATE, FERRITIN, TIBC, IRON,  RETICCTPCT in the last 72 hours. Urinalysis    Component Value Date/Time   COLORURINE YELLOW 05/10/2018 2024   APPEARANCEUR CLEAR 05/10/2018 2024   LABSPEC 1.006 05/10/2018 2024   PHURINE 6.0 05/10/2018 2024   GLUCOSEU NEGATIVE 05/10/2018 2024   HGBUR NEGATIVE 05/10/2018 2024   BILIRUBINUR NEGATIVE 05/10/2018 2024   KETONESUR 20 (A) 05/10/2018 2024   PROTEINUR NEGATIVE 05/10/2018 2024   NITRITE NEGATIVE 05/10/2018 2024   LEUKOCYTESUR NEGATIVE 05/10/2018 2024   Sepsis Labs Invalid input(s): PROCALCITONIN,  WBC,  LACTICIDVEN Microbiology Recent Results (from the past 240 hour(s))  Blood Cultures (routine x 2)     Status: None (Preliminary result)   Collection Time: 05/10/18  6:10 PM  Result Value Ref Range Status   Specimen Description   Final    BLOOD RIGHT ANTECUBITAL Performed at [facility], [place] [address], [place], [place] [address]    Special Requests   Final    BOTTLES DRAWN AEROBIC AND ANAEROBIC Blood Culture adequate volume Performed at [facility] [address], [place], [place] [address]    Culture   Final    NO GROWTH 2 DAYS Performed at [facility] [address] [place], [place] [address]    Report Status PENDING  Incomplete   Time coordinating discharge: 35 minutes  SIGNED:  Kerney Elbe, DO [facility] 05/12/2018, 12:48 PM Pager is on [place]  If 7PM-7AM, please contact night-coverage [email or web address] Password [email or web address]

## 2018-05-12 NOTE — Progress Notes (Signed)
Discharge paperwork discussed with pt and wife at the bedside.  Questions were answered, and pt was escorted by wheelchair in stable condition to main lobby.

## 2018-05-13 LAB — URINE CULTURE: Culture: NO GROWTH

## 2018-05-15 LAB — CULTURE, BLOOD (ROUTINE X 2)
CULTURE: NO GROWTH
SPECIAL REQUESTS: ADEQUATE

## 2018-05-20 DIAGNOSIS — D692 Other nonthrombocytopenic purpura: Secondary | ICD-10-CM | POA: Diagnosis not present

## 2018-05-20 DIAGNOSIS — E871 Hypo-osmolality and hyponatremia: Secondary | ICD-10-CM | POA: Diagnosis not present

## 2018-05-20 DIAGNOSIS — R443 Hallucinations, unspecified: Secondary | ICD-10-CM | POA: Diagnosis not present

## 2018-05-20 DIAGNOSIS — D649 Anemia, unspecified: Secondary | ICD-10-CM | POA: Diagnosis not present

## 2018-05-23 DIAGNOSIS — E871 Hypo-osmolality and hyponatremia: Secondary | ICD-10-CM | POA: Diagnosis not present

## 2018-06-02 DIAGNOSIS — Z23 Encounter for immunization: Secondary | ICD-10-CM | POA: Diagnosis not present

## 2018-06-02 DIAGNOSIS — K7460 Unspecified cirrhosis of liver: Secondary | ICD-10-CM | POA: Diagnosis not present

## 2018-06-02 DIAGNOSIS — D649 Anemia, unspecified: Secondary | ICD-10-CM | POA: Diagnosis not present

## 2018-06-02 DIAGNOSIS — I10 Essential (primary) hypertension: Secondary | ICD-10-CM | POA: Diagnosis not present

## 2018-06-02 DIAGNOSIS — I2510 Atherosclerotic heart disease of native coronary artery without angina pectoris: Secondary | ICD-10-CM | POA: Diagnosis not present

## 2018-06-02 DIAGNOSIS — Z8589 Personal history of malignant neoplasm of other organs and systems: Secondary | ICD-10-CM | POA: Diagnosis not present

## 2018-06-02 DIAGNOSIS — Z0000 Encounter for general adult medical examination without abnormal findings: Secondary | ICD-10-CM | POA: Diagnosis not present

## 2018-06-02 DIAGNOSIS — E871 Hypo-osmolality and hyponatremia: Secondary | ICD-10-CM | POA: Diagnosis not present

## 2018-06-02 DIAGNOSIS — I714 Abdominal aortic aneurysm, without rupture: Secondary | ICD-10-CM | POA: Diagnosis not present

## 2018-06-02 DIAGNOSIS — N400 Enlarged prostate without lower urinary tract symptoms: Secondary | ICD-10-CM | POA: Diagnosis not present

## 2018-06-02 DIAGNOSIS — Z7289 Other problems related to lifestyle: Secondary | ICD-10-CM | POA: Diagnosis not present

## 2018-06-02 DIAGNOSIS — E7800 Pure hypercholesterolemia, unspecified: Secondary | ICD-10-CM | POA: Diagnosis not present

## 2018-06-02 DIAGNOSIS — Z955 Presence of coronary angioplasty implant and graft: Secondary | ICD-10-CM | POA: Diagnosis not present

## 2018-06-06 DIAGNOSIS — E871 Hypo-osmolality and hyponatremia: Secondary | ICD-10-CM | POA: Diagnosis not present

## 2018-06-16 ENCOUNTER — Encounter: Payer: Self-pay | Admitting: Cardiovascular Disease

## 2018-06-16 DIAGNOSIS — E871 Hypo-osmolality and hyponatremia: Secondary | ICD-10-CM | POA: Diagnosis not present

## 2018-07-01 ENCOUNTER — Telehealth: Payer: Self-pay

## 2018-07-01 DIAGNOSIS — M1712 Unilateral primary osteoarthritis, left knee: Secondary | ICD-10-CM | POA: Diagnosis not present

## 2018-07-01 NOTE — Telephone Encounter (Signed)
   [facility] Pre-operative Risk Assessment    Request for surgical clearance:  1. What type of surgery is being performed? Left Total Knee Replacement    2. When is this surgery scheduled? TBD   3. What type of clearance is required (medical clearance vs. Pharmacy clearance to hold med vs. Both)? Both  4. Are there any medications that need to be held prior to surgery and how long? Not specified patient is taking Aspirin 9m   5. Practice name and name of physician performing surgery? [facility], TEdmonia Lynch MD    6. What is your office phone number? [phone]   7.   What is your office fax number? [phone]: KClaiborne Billings 8.   Anesthesia type (None, local, MAC, general) ? Not specified   TJacqulynn Cadet11/08/2017, 3:51 PM  _________________________________________________________________   (provider comments below)

## 2018-07-04 NOTE — Telephone Encounter (Signed)
The patient has a planned appt on 07/07/18 with Dr. Loletha Grayer. Dr. C- please address preop clearance for knee surgery.  Pt tells me that his knee surgery will likely not be until after March.   I will route this to Dr. Loletha Grayer and to the preop callback pool to contact the office and let them know that pt will be seeing Dr. Loletha Grayer on 11/7.   I will remove this from the preop pool.

## 2018-07-06 ENCOUNTER — Telehealth: Payer: Self-pay | Admitting: Hematology and Oncology

## 2018-07-06 ENCOUNTER — Telehealth: Payer: Self-pay

## 2018-07-06 NOTE — Telephone Encounter (Signed)
Received fax from [facility] for clearance for knee replacement from hematology standpoint.  Called patient regarding fax. He is planning to have surgery in late 3/20. He is requesting appt to see Dr. Alvy Bimler in late 2/20 for clearance.  Scheduling message sent.

## 2018-07-06 NOTE — Telephone Encounter (Signed)
Appts scheduled and I spoke with patient/ letter/calendar mailed per 11/6 sch msg

## 2018-07-07 ENCOUNTER — Other Ambulatory Visit: Payer: Self-pay

## 2018-07-07 ENCOUNTER — Encounter: Payer: Self-pay | Admitting: Cardiovascular Disease

## 2018-07-07 ENCOUNTER — Ambulatory Visit: Payer: PPO | Admitting: Cardiovascular Disease

## 2018-07-07 VITALS — BP 158/76 | HR 66 | Ht 64.0 in | Wt 149.6 lb

## 2018-07-07 DIAGNOSIS — I25118 Atherosclerotic heart disease of native coronary artery with other forms of angina pectoris: Secondary | ICD-10-CM | POA: Diagnosis not present

## 2018-07-07 DIAGNOSIS — Z01810 Encounter for preprocedural cardiovascular examination: Secondary | ICD-10-CM

## 2018-07-07 DIAGNOSIS — I350 Nonrheumatic aortic (valve) stenosis: Secondary | ICD-10-CM

## 2018-07-07 DIAGNOSIS — E7800 Pure hypercholesterolemia, unspecified: Secondary | ICD-10-CM | POA: Diagnosis not present

## 2018-07-07 DIAGNOSIS — I714 Abdominal aortic aneurysm, without rupture, unspecified: Secondary | ICD-10-CM

## 2018-07-07 DIAGNOSIS — I10 Essential (primary) hypertension: Secondary | ICD-10-CM | POA: Diagnosis not present

## 2018-07-07 DIAGNOSIS — I739 Peripheral vascular disease, unspecified: Secondary | ICD-10-CM

## 2018-07-07 NOTE — Patient Instructions (Signed)
Medication Instructions:  Dr Croitoru recommends that you continue on your current medications as directed. Please refer to the Current Medication list given to you today.  If you need a refill on your cardiac medications before your next appointment, please call your pharmacy.   Follow-Up: At [facility], you and your health needs are our priority.  As part of our continuing mission to provide you with exceptional heart care, we have created designated Provider Care Teams.  These Care Teams include your primary Cardiologist (physician) and Advanced Practice Providers (APPs -  Physician Assistants and Nurse Practitioners) who all work together to provide you with the care you need, when you need it. You will need a follow up appointment in 12 months.  Please call our office 2 months in advance to schedule this appointment.  You may see Mihai Croitoru, MD or one of the following Advanced Practice Providers on your designated Care Team: Hao Meng, PA-C . Angela Duke, PA-C 

## 2018-07-07 NOTE — Progress Notes (Signed)
Cardiology Office Note   Date:  07/09/2018   ID:  Corey Garcia, Corey Garcia [birth year], MRN [id number]  PCP:  [name], MD  Cardiologist:  Dr.Ikea Demicco   Chief Complaint  Patient presents with  . Coronary Artery Disease     History of Present Illness: Corey Garcia is a 72 y.o. male with CAD, vasovagal syncope, AAA s/p EVAR, hypertension, hyperlipidemia, chronic liver disease.  He has not required coronary intervention since 2009 when he had cutting balloon atherectomy of the OM2 and DES to the LAD, residual 50% RCA stenosis. He had a low risk nuclear study April 2014.  The patient specifically denies any chest pain at rest exertion, dyspnea at rest or with exertion, orthopnea, paroxysmal nocturnal dyspnea, syncope, palpitations, focal neurological deficits, intermittent claudication, lower extremity edema, unexplained weight gain, cough, hemoptysis or wheezing.  Activities mostly limited by left knee pain and he is planning replacement with Dr. Edmonia Lynch next year.  He is scheduled for ankle-brachial indices tomorrow in Dr. Nicole Cella office and then an ultrasound of the abdominal aorta in January.  He had a physical exam with Dr. Drema Dallas in October when his lipid profile was checked.  The LDL is borderline satisfactory at 76 and the other lipid parameters were okay.  Past Medical History:  Diagnosis Date  . Alcoholism ([place]) 03/15/2014  . Anemia   . Arthritis    all over, some ra  . Bradycardia by electrocardiogram    BB decreased 11/12/11  . CAD (coronary artery disease)    last cath=01/2008, PCI  . CHF (congestive heart failure) ([place])   . Dyslipidemia   . ED (erectile dysfunction)    viagra helps  . GERD (gastroesophageal reflux disease)   . HTN (hypertension)   . Iron overload 03/15/2014  . 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 Murmur, cardiac    faint early systolic grade 1/6 aortic ejection murmur  . Myocardial infarction ([place])   . Peripheral  vascular disease ([place])   . Rib pain 04/16/2014  . Skin cancer    basal cell ca X 4    Past Surgical History:  Procedure Laterality Date  . ABDOMINAL AORTIC ENDOVASCULAR STENT GRAFT N/A 01/13/2018   Procedure: ABDOMINAL AORTIC ENDOVASCULAR STENT GRAFT;  Surgeon: Angelia Mould, MD;  Location: [facility] OR;  Service: Vascular;  Laterality: N/A;  . CORONARY ANGIOPLASTY WITH STENT PLACEMENT  01/31/2008   PCI with cutting balloon arthrectomy of the ostium of the circ obtuse marg 2 vessel  . CORONARY ANGIOPLASTY WITH STENT PLACEMENT  01/27/08   nl LV function, PTCA/stenting of LAD with 3x28mm Cypher post dilated 3.4mm  . HERNIA REPAIR    . KNEE ARTHROSCOPY Left   . MOHS SURGERY     x4  . NASAL SEPTUM SURGERY       Current Outpatient Medications  Medication Sig Dispense Refill  . acetaminophen (TYLENOL) 650 MG CR tablet Take 2 tablets (1,300 mg total) by mouth every 8 (eight) hours as needed for pain.    Marland Kitchen amLODipine (NORVASC) 2.5 MG tablet Take 1 tablet (2.5 mg total) by mouth daily. 90 tablet 3  . aspirin 81 MG tablet Take 81 mg by mouth daily.    Marland Kitchen atorvastatin (LIPITOR) 20 MG tablet Take 20 mg by mouth daily.    . bisoprolol (ZEBETA) 10 MG tablet Take 15 mg by mouth daily. Take 1.5 tablets by mouth daily.    . cholecalciferol (VITAMIN D) 1000 units tablet Take 1,000 Units by  mouth daily.    . hydrocortisone (PROCTO-MED HC) 2.5 % rectal cream Place 1 application rectally as needed for hemorrhoids or itching.    . loratadine (CLARITIN) 10 MG tablet Take 1 tablet by mouth daily.    . Multiple Vitamins-Minerals (ONE DAILY MULTIVITAMIN MEN PO) Take 1 tablet by mouth daily.     . pantoprazole (PROTONIX) 40 MG tablet Take 40 mg by mouth daily.     . Probiotic Product (PROBIOTIC PO) Take 1 tablet by mouth daily.    . tacrolimus (PROTOPIC) 0.03 % ointment Apply 1 application topically daily as needed (dermatitis on face).     . tamsulosin (FLOMAX) 0.4 MG CAPS capsule Take 0.4 mg by mouth daily.      No current facility-administered medications for this visit.     Allergies:   Lisinopril; Minocin [minocycline hcl]; Sulfa antibiotics; Thiazide-type diuretics; and Codeine    Social History:  The patient  reports that he quit smoking about 35 years ago. He has never used smokeless tobacco. He reports that he drinks about 28.0 standard drinks of alcohol per week. He reports that he does not use drugs.   Family History:  The patient's family history includes Heart disease in his brother, father, and mother; Heart failure in his brother, father, and mother.    ROS: Please see history of present illness.  All other systems are reviewed and are negative   PHYSICAL EXAM: VS:  BP (!) 158/76   Pulse 66   Ht 5\' 4"  (1.626 m)   Wt 149 lb 9.6 oz (67.9 kg)   BMI 25.68 kg/m  , BMI Body mass index is 25.68 kg/m.  General: Alert, oriented x3, no distress, lean, but with some ventral adiposity Head: no evidence of trauma, PERRL, EOMI, no exophtalmos or lid lag, no myxedema, no xanthelasma; normal ears, nose and oropharynx Neck: normal jugular venous pulsations and no hepatojugular reflux; brisk carotid pulses without delay and no carotid bruits Chest: clear to auscultation, no signs of consolidation by percussion or palpation, normal fremitus, symmetrical and full respiratory excursions Cardiovascular: normal position and quality of the apical impulse, regular rhythm, normal first and second heart sounds, 1/6 systolic ejection murmur along the right sternal border (early peaking), no diastolic murmurs, rubs or gallops Abdomen: no tenderness or distention, no masses by palpation, no abnormal pulsatility or arterial bruits, normal bowel sounds, no hepatosplenomegaly Extremities: no clubbing, cyanosis or edema; 2+ radial, ulnar and brachial pulses bilaterally; 2+ right femoral, posterior tibial and dorsalis pedis pulses; 2+ left femoral, posterior tibial and dorsalis pedis pulses; no subclavian or  femoral bruits Neurological: grossly nonfocal Psych: Normal mood and affect  EKG: ECG is ordered today which shows normal sinus rhythm, normal tracing.   Recent Labs: 05/11/2018: TSH 3.513 05/12/2018: ALT 28; BUN 6; Creatinine, Ser 0.78; Hemoglobin 10.5; Magnesium 1.5; Platelets 288; Potassium 3.3; Sodium 135  June 16, 2018 Dr. Drema Dallas: Total cholesterol 142, HDL 48, LDL 76, triglycerides 90 Hemoglobin of 11.0, creatinine 0.96  Lipid Panel    Component Value Date/Time   CHOL  01/27/2008 0652    157        ATP III CLASSIFICATION:  <200     mg/dL   Desirable  200-239  mg/dL   Borderline High  >=240    mg/dL   High   TRIG 52 01/27/2008 0652   HDL 53 01/27/2008 0652   CHOLHDL 3.0 01/27/2008 0652   VLDL 10 01/27/2008 0652   LDLCALC  01/27/2008 9833  94        Total Cholesterol/HDL:CHD Risk Coronary Heart Disease Risk Table                     Men   Women  1/2 Average Risk   3.4   3.3      Wt Readings from Last 3 Encounters:  07/07/18 149 lb 9.6 oz (67.9 kg)  05/11/18 150 lb 2.1 oz (68.1 kg)  02/28/18 152 lb (68.9 kg)     1. Coronary artery disease involving native coronary artery of native heart with other form of angina pectoris ([place])   2. Abdominal aneurysm ([place])   3. Hypercholesterolemia   4. Essential hypertension   5. Nonrheumatic aortic valve stenosis   6. Preop cardiovascular exam      ASSESSMENT AND PLAN:  1. CAD: Asymptomatic/no angina.  Last evaluation was a low risk nuclear stress test in 2014, no revascularization since 2009.  Normal left ventricular ejection fraction by echo in March 2018. 2. AAA s/p EVAR: Good results, follow-up scheduled for January with Dr. Scot Dock. 3. HLP: Ideally, LDL cholesterol to be less than 70, but I am reluctant to increase his statin in the setting of chronic liver disease.  He is very close to target. 4. HTN: He reports that his blood pressure today is not typical.  Usually at home blood pressure in the 120s/60s.  I did  not change his medications today. 5. AS: Asymptomatic, very mild by echo in 2018.  Unlikely to progress rapidly  6. Preop CV eval: At this point he is at low risk for major cardiovascular complications with planned knee replacement surgery.  I would not recommend additional evaluation as long as he does not develop new symptoms between now with the planned procedure.  It will be important to continue with his bisoprolol without interruption throughout the entire perioperative period.  Chart reviewed as part of pre-operative protocol coverage. Given past medical history and time since last visit, based on ACC/AHA guidelines, Corey Garcia would be at acceptable risk for the planned procedure without further cardiovascular testing.    2. Hypertension: Elevated in the office today. He is very nervous about upcoming surgery and AAA. He is worried about rupture. Will  add low dose amlodipine 2.5 mg to regimen.  Currently on bisoprolol 15 mg daily, with normal HR. He is intolerant to lisinopril and HCTZ.   3. Hx of CAD: Hx of PTCA of high-grade stenosis of the mid LAD and ostial circumflex, he underwent two-vessel CI cutting balloon atherectomy of the OM and DES to the LAD. He had 50% RCA stenosis. Continues on secondary prevention.   4. Hypercholesterolemia: Continue statin therapy.    Current medicines are reviewed at length with the patient today.    Labs/ tests ordered today include: None   Phill Myron. [place] Pugh, ANP, AACC   07/09/2018 12:22 PM    [facility] [address] [place], [place] [address] Phone: [phone]; Fax: [phone]

## 2018-07-08 ENCOUNTER — Ambulatory Visit (HOSPITAL_COMMUNITY)
Admission: RE | Admit: 2018-07-08 | Discharge: 2018-07-08 | Disposition: A | Discharge status: Home / Self Care | Payer: PPO | Source: Ambulatory Visit | Attending: Vascular Surgery | Admitting: Vascular Surgery

## 2018-07-08 DIAGNOSIS — Z01810 Encounter for preprocedural cardiovascular examination: Secondary | ICD-10-CM | POA: Diagnosis not present

## 2018-07-08 DIAGNOSIS — I739 Peripheral vascular disease, unspecified: Secondary | ICD-10-CM | POA: Insufficient documentation

## 2018-07-09 ENCOUNTER — Encounter: Payer: Self-pay | Admitting: Cardiovascular Disease

## 2018-07-21 DIAGNOSIS — N401 Enlarged prostate with lower urinary tract symptoms: Secondary | ICD-10-CM | POA: Diagnosis not present

## 2018-07-21 DIAGNOSIS — N5201 Erectile dysfunction due to arterial insufficiency: Secondary | ICD-10-CM | POA: Diagnosis not present

## 2018-07-21 DIAGNOSIS — R3912 Poor urinary stream: Secondary | ICD-10-CM | POA: Diagnosis not present

## 2018-07-25 ENCOUNTER — Encounter: Payer: Self-pay | Admitting: Vascular Surgery

## 2018-08-08 DIAGNOSIS — L570 Actinic keratosis: Secondary | ICD-10-CM | POA: Diagnosis not present

## 2018-08-08 DIAGNOSIS — L309 Dermatitis, unspecified: Secondary | ICD-10-CM | POA: Diagnosis not present

## 2018-08-09 DIAGNOSIS — Z955 Presence of coronary angioplasty implant and graft: Secondary | ICD-10-CM | POA: Diagnosis not present

## 2018-08-09 DIAGNOSIS — I714 Abdominal aortic aneurysm, without rupture: Secondary | ICD-10-CM | POA: Diagnosis not present

## 2018-08-09 DIAGNOSIS — I2510 Atherosclerotic heart disease of native coronary artery without angina pectoris: Secondary | ICD-10-CM | POA: Diagnosis not present

## 2018-08-09 DIAGNOSIS — M1712 Unilateral primary osteoarthritis, left knee: Secondary | ICD-10-CM | POA: Diagnosis not present

## 2018-08-16 DIAGNOSIS — Z6826 Body mass index (BMI) 26.0-26.9, adult: Secondary | ICD-10-CM | POA: Diagnosis not present

## 2018-08-16 DIAGNOSIS — M25441 Effusion, right hand: Secondary | ICD-10-CM | POA: Diagnosis not present

## 2018-09-07 ENCOUNTER — Ambulatory Visit (HOSPITAL_COMMUNITY)
Admission: RE | Admit: 2018-09-07 | Discharge: 2018-09-07 | Disposition: A | Discharge status: Home / Self Care | Payer: PPO | Source: Ambulatory Visit | Attending: Vascular Surgery | Admitting: Vascular Surgery

## 2018-09-07 ENCOUNTER — Ambulatory Visit: Payer: PPO | Admitting: Vascular Surgery

## 2018-09-07 ENCOUNTER — Other Ambulatory Visit: Payer: Self-pay

## 2018-09-07 ENCOUNTER — Encounter: Payer: Self-pay | Admitting: Vascular Surgery

## 2018-09-07 VITALS — BP 148/79 | HR 62 | Temp 97.4°F | Resp 18 | Ht 64.0 in | Wt 149.0 lb

## 2018-09-07 DIAGNOSIS — I714 Abdominal aortic aneurysm, without rupture, unspecified: Secondary | ICD-10-CM

## 2018-09-07 DIAGNOSIS — Z48812 Encounter for surgical aftercare following surgery on the circulatory system: Secondary | ICD-10-CM | POA: Diagnosis not present

## 2018-09-07 NOTE — Progress Notes (Signed)
Patient name: Corey Garcia MRN: [id number] DOB: 03/11/1946 Sex: male  REASON FOR VISIT:   Follow-up after endovascular repair of abdominal aortic aneurysm.  HPI:   Corey Garcia is a pleasant 73 y.o. male who would presented with a 3.2 cm saccular infrarenal abdominal aortic aneurysm.  Given that the aneurysm is saccular which puts that at increased risk for rupture elective repair was recommended.  The patient underwent endovascular repair of this aneurysm on 01/13/2018.  This was repaired with a 26 mm x 21 mm x 10 cm device.  At the time of the procedure there was a good seal proximally and a good seal distally.  There was some filling of the sac via lumbar arteries consistent with a type II endoleak.  The patient was seen for a follow-up visit on 02/28/2018.  The maximum diameter of the saccular aneurysm was 3.4 cm which had not changed significantly in size.  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  This right common iliac artery measured 2.5 cm in maximum diameter.  Since I saw the patient last he does have some chronic low back pain that is aggravated by activity but no new onset back pain.  There have been no significant changes in his medical history.  He states that his blood pressure has been under good control.  Past Medical History:  Diagnosis Date  . Alcoholism ([place]) 03/15/2014  . Anemia   . Arthritis    all over, some ra  . Bradycardia by electrocardiogram    BB decreased 11/12/11  . CAD (coronary artery disease)    last cath=01/2008, PCI  . CHF (congestive heart failure) ([place])   . Dyslipidemia   . ED (erectile dysfunction)    viagra helps  . GERD (gastroesophageal reflux disease)   . HTN (hypertension)   . Iron overload 03/15/2014  . 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 Murmur, cardiac    faint early systolic grade 1/6 aortic ejection murmur  . Myocardial infarction ([place])   . Peripheral  vascular disease ([place])   . Rib pain 04/16/2014  . Skin cancer    basal cell ca X 4    Family History  Problem Relation Age of Onset  . Heart failure Mother        heart disease  . Heart disease Mother   . Heart failure Father        heart disease  . Heart disease Father   . Heart disease Brother   . Heart failure Brother        "bad heart valve"    SOCIAL HISTORY: Social History   Tobacco Use  . Smoking status: Former Smoker    Last attempt to quit: 10/31/1982    Years since quitting: 35.8  . Smokeless tobacco: Never Used  Substance Use Topics  . Alcohol use: Yes    Alcohol/week: 28.0 standard drinks    Types: 28 Cans of beer per week    Comment: "3-4 beers or hard apple ciders/ day" 01/10/2018    Allergies  Allergen Reactions  . Lisinopril Other (See Comments)    PHOTOSENSITIVITY ON ARMS  . Minocin [Minocycline Hcl]     Flank pain  . Sulfa Antibiotics   . Thiazide-Type Diuretics Other (See Comments)    "Lowered my blood pressure"  . Codeine Nausea Only    Current Outpatient Medications  Medication Sig Dispense Refill  . acetaminophen (TYLENOL) 650 MG  CR tablet Take 2 tablets (1,300 mg total) by mouth every 8 (eight) hours as needed for pain.    Marland Kitchen aspirin 81 MG tablet Take 81 mg by mouth daily.    Marland Kitchen atorvastatin (LIPITOR) 20 MG tablet Take 20 mg by mouth daily.    . bisoprolol (ZEBETA) 10 MG tablet Take 15 mg by mouth daily. Take 1.5 tablets by mouth daily.    . cholecalciferol (VITAMIN D) 1000 units tablet Take 1,000 Units by mouth daily.    . diclofenac sodium (VOLTAREN) 1 % GEL Apply topically 4 (four) times daily.    . hydrocortisone (PROCTO-MED HC) 2.5 % rectal cream Place 1 application rectally as needed for hemorrhoids or itching.    . loratadine (CLARITIN) 10 MG tablet Take 1 tablet by mouth daily.    . Multiple Vitamins-Minerals (ONE DAILY MULTIVITAMIN MEN PO) Take 1 tablet by mouth daily.     . pantoprazole (PROTONIX) 40 MG tablet Take 40 mg by mouth  daily.     . Probiotic Product (PROBIOTIC PO) Take 1 tablet by mouth daily.    . tacrolimus (PROTOPIC) 0.03 % ointment Apply 1 application topically daily as needed (dermatitis on face).     . tamsulosin (FLOMAX) 0.4 MG CAPS capsule Take 0.4 mg by mouth daily.    Marland Kitchen amLODipine (NORVASC) 2.5 MG tablet Take 1 tablet (2.5 mg total) by mouth daily. 90 tablet 3   No current facility-administered medications for this visit.     REVIEW OF SYSTEMS:  [X]  denotes positive finding, [ ]  denotes negative finding Cardiac  Comments:  Chest pain or chest pressure:    Shortness of breath upon exertion:    Short of breath when lying flat:    Irregular heart rhythm:        Vascular    Pain in calf, thigh, or hip brought on by ambulation:    Pain in feet at night that wakes you up from your sleep:     Blood clot in your veins:    Leg swelling:         Pulmonary    Oxygen at home:    Productive cough:     Wheezing:         Neurologic    Sudden weakness in arms or legs:     Sudden numbness in arms or legs:     Sudden onset of difficulty speaking or slurred speech:    Temporary loss of vision in one eye:     Problems with dizziness:         Gastrointestinal    Blood in stool:     Vomited blood:         Genitourinary    Burning when urinating:     Blood in urine:        Psychiatric    Major depression:         Hematologic    Bleeding problems:    Problems with blood clotting too easily:        Skin    Rashes or ulcers:        Constitutional    Fever or chills:     PHYSICAL EXAM:   Vitals:   09/07/18 0858  BP: (!) 148/79  Pulse: 62  Resp: 18  Temp: (!) 97.4 F (36.3 C)  TempSrc: Oral  SpO2: 99%  Weight: 149 lb (67.6 kg)  Height: 5\' 4"  (1.626 m)    GENERAL: The patient is a well-nourished male, in no acute  distress. The vital signs are documented above. CARDIAC: There is a regular rate and rhythm.  VASCULAR: I do not detect carotid bruits. He has palpable femoral  pulses. Both feet are warm and well-perfused. PULMONARY: There is good air exchange bilaterally without wheezing or rales. ABDOMEN: Soft and non-tender with normal pitched bowel sounds.  MUSCULOSKELETAL: There are no major deformities or cyanosis. NEUROLOGIC: No focal weakness or paresthesias are detected. SKIN: There are no ulcers or rashes noted. PSYCHIATRIC: The patient has a normal affect.  DATA:    DUPLEX ABDOMINAL AORTA: I have independently interpreted his duplex of the abdominal aorta.  This shows that the maximum diameter of his aneurysm is 3.2 cm.  Thus this has not increased in size.  He is noted to have a jet near the proximal graft suggesting a possible type I endoleak.  ARTERIAL DOPPLER STUDY: I did review his arterial Doppler study that was done on 07/08/2018.  On the right side he had a triphasic dorsalis pedis and posterior tibial signal.  The arteries were not compressible thus an ABI could not be obtained.  On the left side he had a biphasic dorsalis pedis and posterior tibial signal.  ABI was 96%.  MEDICAL ISSUES:   STATUS POST ENDOVASCULAR REPAIR OF SACCULAR ANEURYSM: This patient underwent repair of a saccular aneurysm on 01/13/2018 with 1 piece.  His follow-up study shows no increase in the size of his aneurysm however there does appear to be a small type I endoleak on his duplex.  This could potentially represent a small branch also.  I have recommended a CT angiogram to help sort this out.  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  He is on aspirin and is on a statin.  I will call him at [phone] after the results of his CT are back.  If he does not fact have a type I endoleak we would have to consider how to address this.  This could potentially require placement of a proximal cuff.  If there is no type I endoleak that I think we can continue to follow this with ultrasound and I have ordered a follow-up study in 6  months.  Deitra Mayo [facility] [phone]

## 2018-09-09 ENCOUNTER — Other Ambulatory Visit: Payer: Self-pay

## 2018-09-09 DIAGNOSIS — I713 Abdominal aortic aneurysm, ruptured, unspecified: Secondary | ICD-10-CM

## 2018-09-20 ENCOUNTER — Ambulatory Visit
Admission: RE | Admit: 2018-09-20 | Discharge: 2018-09-20 | Disposition: A | Discharge status: Home / Self Care | Payer: PPO | Source: Ambulatory Visit | Attending: Vascular Surgery | Admitting: Vascular Surgery

## 2018-09-20 ENCOUNTER — Telehealth: Payer: Self-pay | Admitting: *Deleted

## 2018-09-20 DIAGNOSIS — I713 Abdominal aortic aneurysm, ruptured, unspecified: Secondary | ICD-10-CM

## 2018-09-20 DIAGNOSIS — I714 Abdominal aortic aneurysm, without rupture: Secondary | ICD-10-CM | POA: Diagnosis not present

## 2018-09-20 MED ORDER — IOPAMIDOL (ISOVUE-370) INJECTION 76%
75.0000 mL | Freq: Once | INTRAVENOUS | Status: AC | PRN
  Administered 2018-09-20: 75 mL via INTRAVENOUS

## 2018-09-20 NOTE — Telephone Encounter (Signed)
-----   Message from Angelia Mould, MD sent at 09/20/2018  1:25 PM EST ----- Regarding: CT results This patient had a CT scan to evaluate for a possible type I endoleak.  He does not have a type I endoleak and I have discussed these results with him.  When I saw him in the office last he was scheduled for a six-month duplex and we can simply keep that appointment.  Thank you. CD

## 2018-09-20 NOTE — Telephone Encounter (Signed)
Documentation of staff message for future appt

## 2018-10-04 ENCOUNTER — Other Ambulatory Visit: Payer: Self-pay | Admitting: Hematology and Oncology

## 2018-10-04 DIAGNOSIS — D61818 Other pancytopenia: Secondary | ICD-10-CM

## 2018-10-06 ENCOUNTER — Inpatient Hospital Stay: Payer: PPO | Attending: Hematology and Oncology | Admitting: Hematology and Oncology

## 2018-10-06 ENCOUNTER — Inpatient Hospital Stay: Payer: PPO

## 2018-10-06 ENCOUNTER — Encounter: Payer: Self-pay | Admitting: Hematology and Oncology

## 2018-10-06 ENCOUNTER — Telehealth: Payer: Self-pay

## 2018-10-06 DIAGNOSIS — L218 Other seborrheic dermatitis: Secondary | ICD-10-CM | POA: Insufficient documentation

## 2018-10-06 DIAGNOSIS — L219 Seborrheic dermatitis, unspecified: Secondary | ICD-10-CM | POA: Insufficient documentation

## 2018-10-06 DIAGNOSIS — D539 Nutritional anemia, unspecified: Secondary | ICD-10-CM | POA: Diagnosis not present

## 2018-10-06 DIAGNOSIS — G8929 Other chronic pain: Secondary | ICD-10-CM | POA: Insufficient documentation

## 2018-10-06 DIAGNOSIS — M25562 Pain in left knee: Secondary | ICD-10-CM | POA: Insufficient documentation

## 2018-10-06 DIAGNOSIS — D61818 Other pancytopenia: Secondary | ICD-10-CM

## 2018-10-06 LAB — CBC WITH DIFFERENTIAL/PLATELET
Abs Immature Granulocytes: 0.04 10*3/uL (ref 0.00–0.07)
BASOS ABS: 0 10*3/uL (ref 0.0–0.1)
BASOS PCT: 0 %
EOS PCT: 3 %
Eosinophils Absolute: 0.2 10*3/uL (ref 0.0–0.5)
HCT: 38.1 % — ABNORMAL LOW (ref 39.0–52.0)
HEMOGLOBIN: 12.4 g/dL — AB (ref 13.0–17.0)
Immature Granulocytes: 1 %
LYMPHS PCT: 39 %
Lymphs Abs: 2.1 10*3/uL (ref 0.7–4.0)
MCH: 33 pg (ref 26.0–34.0)
MCHC: 32.5 g/dL (ref 30.0–36.0)
MCV: 101.3 fL — ABNORMAL HIGH (ref 80.0–100.0)
MONO ABS: 0.8 10*3/uL (ref 0.1–1.0)
Monocytes Relative: 15 %
NEUTROS ABS: 2.3 10*3/uL (ref 1.7–7.7)
NRBC: 0 % (ref 0.0–0.2)
Neutrophils Relative %: 42 %
PLATELETS: 160 10*3/uL (ref 150–400)
RBC: 3.76 MIL/uL — AB (ref 4.22–5.81)
RDW: 11.7 % (ref 11.5–15.5)
WBC: 5.5 10*3/uL (ref 4.0–10.5)

## 2018-10-06 LAB — FERRITIN: FERRITIN: 107 ng/mL (ref 24–336)

## 2018-10-06 NOTE — Progress Notes (Signed)
[place] NOTE  Corey Ruff, MD  ASSESSMENT & PLAN:  Macrocytic anemia The patient has a history of intermittent pancytopenia. Repeat blood work show no evidence of leukopenia or thrombocytopenia today.  He has mild macrocytic anemia, likely due to liver disease.  He is not symptomatic.  Observe only.  Hemochromatosis His ferritin level is stable He does not need phlebotomy We discussed future follow-up He would like to follow with her primary care doctor for iron studies monitoring I recommend repeat phlebotomy if ferritin level is greater than 500  Seborrheic eczema of scalp The patient had seborrheic eczema of his scalp He has seen a dermatologist I recommend conservative management  Left knee pain He has chronic left knee pain There is no contraindication for him to proceed for surgery from my stand point   No orders of the defined types were placed in this encounter.   INTERVAL HISTORY: Corey Garcia 73 y.o. male returns for further follow-up. He desire left knee surgery for weakness and chronic knee pain Patient also complained of intermittent scalp itching and rash comes and goes.  He sees a dermatologist for this The patient had vascular surgery recently for aortic aneurysm.  He has close follow-up with vascular surgeon and cardiologist. He is seen specifically to get hematology clearance to proceed for left knee replacement therapy  SUMMARY OF HEMATOLOGIC HISTORY:  The abnormal blood work was discovered during a routine visit with his primary care provider. I have the opportunity to review his blood work dated back for a while. His white blood cell count ranged from 3.1-5.6, hemoglobin from 11.3-12.6, MCV from 103-104.5, and ferritin as high as 910, along with abnormal liver function tests. He complains of fatigue, chronic headaches and chronic joint pain. The patient has history of basal cell carcinoma removed 4 times. He  had been on topical steroid cream for eczema and complained of diffuse skin rash on his sun exposed area and face. He also has a significant easy bruising. The patient has been on antiplatelet agents along time for coronary artery disease. The patient denies any recent signs or symptoms of bleeding such as spontaneous epistaxis, hematuria or hematochezia. 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  It was performed to evaluate for macrocytic anemia with mild pancytopenia In May 2019, he had CT imaging which show early signs of liver cirrhosis.  I have reviewed the past medical history, past surgical history, social history and family history with the patient and they are unchanged from previous note.  ALLERGIES:  is allergic to lisinopril; minocin [minocycline hcl]; sulfa antibiotics; thiazide-type diuretics; and codeine.  MEDICATIONS:  Current Outpatient Medications  Medication Sig Dispense Refill  . acetaminophen (TYLENOL) 650 MG CR tablet Take 2 tablets (1,300 mg total) by mouth every 8 (eight) hours as needed for pain.    Marland Kitchen amLODipine (NORVASC) 2.5 MG tablet Take 1 tablet (2.5 mg total) by mouth daily. 90 tablet 3  . aspirin 81 MG tablet Take 81 mg by mouth daily.    Marland Kitchen atorvastatin (LIPITOR) 20 MG tablet Take 20 mg by mouth daily.    . bisoprolol (ZEBETA) 10 MG tablet Take 15 mg by mouth daily. Take 1.5 tablets by mouth daily.    . cholecalciferol (VITAMIN D) 1000 units tablet Take 1,000 Units by mouth daily.    Marland Kitchen  diclofenac sodium (VOLTAREN) 1 % GEL Apply topically 4 (four) times daily.    . hydrocortisone (PROCTO-MED HC) 2.5 % rectal cream Place 1 application rectally as needed for hemorrhoids or itching.    . loratadine (CLARITIN) 10 MG tablet Take 1 tablet by mouth daily.    . Multiple  Vitamins-Minerals (ONE DAILY MULTIVITAMIN MEN PO) Take 1 tablet by mouth daily.     . pantoprazole (PROTONIX) 40 MG tablet Take 40 mg by mouth daily.     . Probiotic Product (PROBIOTIC PO) Take 1 tablet by mouth daily.    . tacrolimus (PROTOPIC) 0.03 % ointment Apply 1 application topically daily as needed (dermatitis on face).     . tamsulosin (FLOMAX) 0.4 MG CAPS capsule Take 0.4 mg by mouth daily.     No current facility-administered medications for this visit.      REVIEW OF SYSTEMS:   Constitutional: Denies fevers, chills or night sweats Eyes: Denies blurriness of vision Ears, nose, mouth, throat, and face: Denies mucositis or sore throat Respiratory: Denies cough, dyspnea or wheezes Cardiovascular: Denies palpitation, chest discomfort or lower extremity swelling Gastrointestinal:  Denies nausea, heartburn or change in bowel habits Lymphatics: Denies new lymphadenopathy or easy bruising Neurological:Denies numbness, tingling or new weaknesses Behavioral/Psych: Mood is stable, no new changes  All other systems were reviewed with the patient and are negative.  PHYSICAL EXAMINATION: ECOG PERFORMANCE STATUS: 1 - Symptomatic but completely ambulatory  Vitals:   10/06/18 1228  BP: (!) 124/54  Pulse: 62  Resp: 18  Temp: 97.9 F (36.6 C)  SpO2: 100%   Filed Weights   10/06/18 1228  Weight: 151 lb 12.8 oz (68.9 kg)    GENERAL:alert, no distress and comfortable SKIN: skin color, texture, turgor are normal, no rashes or significant lesions.  Noted seborrheic eczema changes on his scalp Musculoskeletal:no cyanosis of digits and no clubbing  NEURO: alert & oriented x 3 with fluent speech, no focal motor/sensory deficits  LABORATORY DATA:  I have reviewed the data as listed     Component Value Date/Time   NA 135 05/12/2018 0430   NA 138 06/20/2015 1303   K 3.3 (L) 05/12/2018 0430   K 4.6 06/20/2015 1303   CL 101 05/12/2018 0430   CO2 21 (L) 05/12/2018 0430   CO2 28  06/20/2015 1303   GLUCOSE 79 05/12/2018 0430   GLUCOSE 90 06/20/2015 1303   BUN 6 (L) 05/12/2018 0430   BUN 8.3 06/20/2015 1303   CREATININE 0.78 05/12/2018 0430   CREATININE 0.9 06/20/2015 1303   CALCIUM 9.0 05/12/2018 0430   CALCIUM 9.1 06/20/2015 1303   PROT 6.9 05/12/2018 0430   PROT 6.9 06/20/2015 1303   ALBUMIN 3.1 (L) 05/12/2018 0430   ALBUMIN 3.3 (L) 06/20/2015 1303   AST 40 05/12/2018 0430   AST 88 (H) 06/20/2015 1303   ALT 28 05/12/2018 0430   ALT 82 (H) 06/20/2015 1303   ALKPHOS 75 05/12/2018 0430   ALKPHOS 59 06/20/2015 1303   BILITOT 0.9 05/12/2018 0430   BILITOT 0.45 06/20/2015 1303   GFRNONAA >60 05/12/2018 0430   GFRAA >60 05/12/2018 0430    No results found for: SPEP, UPEP  Lab Results  Component Value Date   WBC 5.5 10/06/2018   NEUTROABS 2.3 10/06/2018   HGB 12.4 (L) 10/06/2018   HCT 38.1 (L) 10/06/2018   MCV 101.3 (H) 10/06/2018   PLT 160 10/06/2018      Chemistry      Component Value Date/Time  NA 135 05/12/2018 0430   NA 138 06/20/2015 1303   K 3.3 (L) 05/12/2018 0430   K 4.6 06/20/2015 1303   CL 101 05/12/2018 0430   CO2 21 (L) 05/12/2018 0430   CO2 28 06/20/2015 1303   BUN 6 (L) 05/12/2018 0430   BUN 8.3 06/20/2015 1303   CREATININE 0.78 05/12/2018 0430   CREATININE 0.9 06/20/2015 1303      Component Value Date/Time   CALCIUM 9.0 05/12/2018 0430   CALCIUM 9.1 06/20/2015 1303   ALKPHOS 75 05/12/2018 0430   ALKPHOS 59 06/20/2015 1303   AST 40 05/12/2018 0430   AST 88 (H) 06/20/2015 1303   ALT 28 05/12/2018 0430   ALT 82 (H) 06/20/2015 1303   BILITOT 0.9 05/12/2018 0430   BILITOT 0.45 06/20/2015 1303       I spent 15 minutes counseling the patient face to face. The total time spent in the appointment was 20 minutes and more than 50% was on counseling.   All questions were answered. The patient knows to call the clinic with any problems, questions or concerns. No barriers to learning was detected.    [name],  MD 2/6/20202:03 PM

## 2018-10-06 NOTE — Assessment & Plan Note (Signed)
The patient has a history of intermittent pancytopenia. Repeat blood work show no evidence of leukopenia or thrombocytopenia today.  He has mild macrocytic anemia, likely due to liver disease.  He is not symptomatic.  Observe only.

## 2018-10-06 NOTE — Assessment & Plan Note (Signed)
He has chronic left knee pain There is no contraindication for him to proceed for surgery from my stand point

## 2018-10-06 NOTE — Telephone Encounter (Signed)
-----   Message from [name], MD sent at 10/06/2018  2:06 PM EST ----- Regarding: ferritin ok Let him know ferritin is 100. Better than last blood check

## 2018-10-06 NOTE — Assessment & Plan Note (Signed)
His ferritin level is stable He does not need phlebotomy We discussed future follow-up He would like to follow with her primary care doctor for iron studies monitoring I recommend repeat phlebotomy if ferritin level is greater than 500

## 2018-10-06 NOTE — Telephone Encounter (Signed)
Called and given below message to wife. She verbalized understanding. 

## 2018-10-06 NOTE — Assessment & Plan Note (Signed)
The patient had seborrheic eczema of his scalp He has seen a dermatologist I recommend conservative management

## 2018-10-17 DIAGNOSIS — M1712 Unilateral primary osteoarthritis, left knee: Secondary | ICD-10-CM | POA: Diagnosis not present

## 2018-10-18 DIAGNOSIS — M1712 Unilateral primary osteoarthritis, left knee: Secondary | ICD-10-CM | POA: Diagnosis present

## 2018-10-18 NOTE — H&P (Signed)
KNEE ARTHROPLASTY ADMISSION H&P  Patient ID: Corey Garcia MRN: [id number] DOB/AGE: 1946/03/27 73 y.o.  Chief Complaint: left knee pain.  Planned Procedure Date: 11/08/2018 Medical Clearance by Dr. Drema Dallas Cardiac Clearance by Dr. Sallyanne Kuster Additional clearance by vascular: Dr. Scot Dock, hematology: Dr. Alvy Bimler.  HPI: Corey Garcia is a 73 y.o. male with a history of BPH, CAD w/ MI s/p stenting 2009, AAA s/p EVAR 2019, hypertension, hyperlipidemia, and chronic liver disease who presents for evaluation of OA LEFT KNEE. The patient has a history of pain and functional disability in the left knee due to arthritis and has failed non-surgical conservative treatments for greater than 12 weeks to include NSAID's and/or analgesics, corticosteriod injections, viscosupplementation injections and activity modification.  Onset of symptoms was gradual, starting 5 + years ago with gradually worsening course since that time. The patient noted prior procedures on the knee to include  arthroscopy and menisectomy on the left knee.  Patient currently rates pain at 7 out of 10 with activity. Patient has night pain, worsening of pain with activity and weight bearing and pain that interferes with activities of daily living.  Patient has evidence of subchondral cysts, subchondral sclerosis, periarticular osteophytes and joint space narrowing by imaging studies.  There is no active infection.  Past Medical History:  Diagnosis Date  . Alcoholism ([place]) 03/15/2014  . Anemia   . Arthritis    all over, some ra  . Bradycardia by electrocardiogram    BB decreased 11/12/11  . CAD (coronary artery disease)    last cath=01/2008, PCI  . CHF (congestive heart failure) ([place])   . Dyslipidemia   . ED (erectile dysfunction)    viagra helps  . GERD (gastroesophageal reflux disease)   . HTN (hypertension)   . Iron overload 03/15/2014  . Liver disease due to alcohol ([place]) 03/15/2014  . Murmur, cardiac    faint early systolic  grade 1/6 aortic ejection murmur  . Myocardial infarction ([place])   . Peripheral vascular disease ([place])   . Rib pain 04/16/2014  . Skin cancer    basal cell ca X 4   Past Surgical History:  Procedure Laterality Date  . ABDOMINAL AORTIC ENDOVASCULAR STENT GRAFT N/A 01/13/2018   Procedure: ABDOMINAL AORTIC ENDOVASCULAR STENT GRAFT;  Surgeon: Angelia Mould, MD;  Location: [facility] OR;  Service: Vascular;  Laterality: N/A;  . CORONARY ANGIOPLASTY WITH STENT PLACEMENT  01/31/2008   PCI with cutting balloon arthrectomy of the ostium of the circ obtuse marg 2 vessel  . CORONARY ANGIOPLASTY WITH STENT PLACEMENT  01/27/08   nl LV function, PTCA/stenting of LAD with 3x75mm Cypher post dilated 3.23mm  . HERNIA REPAIR    . KNEE ARTHROSCOPY Left   . MOHS SURGERY     x4  . NASAL SEPTUM SURGERY     Allergies  Allergen Reactions  . Lisinopril Other (See Comments)    PHOTOSENSITIVITY ON ARMS  . Minocin [Minocycline Hcl]     Flank pain  . Sulfa Antibiotics   . Thiazide-Type Diuretics Other (See Comments)    "Lowered my blood pressure"  . Codeine Nausea Only   Prior to Admission medications   Medication Sig Start Date End Date Taking? Authorizing Provider  acetaminophen (TYLENOL) 650 MG CR tablet Take 2 tablets (1,300 mg total) by mouth every 8 (eight) hours as needed for pain. 05/12/18   Raiford Noble Latif, DO  amLODipine (NORVASC) 2.5 MG tablet Take 1 tablet (2.5 mg total) by mouth daily. 01/25/18 07/07/18  Croitoru, Mihai,  MD  aspirin 81 MG tablet Take 81 mg by mouth daily.    [provider]  atorvastatin (LIPITOR) 20 MG tablet Take 20 mg by mouth daily.    [provider]  bisoprolol (ZEBETA) 10 MG tablet Take 15 mg by mouth daily. Take 1.5 tablets by mouth daily. 08/22/16   [provider]  cholecalciferol (VITAMIN D) 1000 units tablet Take 1,000 Units by mouth daily.    [provider]  diclofenac sodium (VOLTAREN) 1 % GEL Apply topically 4 (four) times daily.     [provider]  hydrocortisone (PROCTO-MED HC) 2.5 % rectal cream Place 1 application rectally as needed for hemorrhoids or itching.    [provider]  loratadine (CLARITIN) 10 MG tablet Take 1 tablet by mouth daily.    [provider]  Multiple Vitamins-Minerals (ONE DAILY MULTIVITAMIN MEN PO) Take 1 tablet by mouth daily.     [provider]  pantoprazole (PROTONIX) 40 MG tablet Take 40 mg by mouth daily.     [provider]  Probiotic Product (PROBIOTIC PO) Take 1 tablet by mouth daily.    [provider]  tacrolimus (PROTOPIC) 0.03 % ointment Apply 1 application topically daily as needed (dermatitis on face).     [provider]  tamsulosin (FLOMAX) 0.4 MG CAPS capsule Take 0.4 mg by mouth daily.    [provider]   Social History   Socioeconomic History  . Marital status: Married    Spouse name: Not on file  . Number of children: Not on file  . Years of education: Not on file  . Highest education level: Not on file  Occupational History  . Occupation: Pharmacist, community  Social Needs  . Financial resource strain: Not on file  . Food insecurity:    Worry: Not on file    Inability: Not on file  . Transportation needs:    Medical: Not on file    Non-medical: Not on file  Tobacco Use  . Smoking status: Former Smoker    Last attempt to quit: 10/31/1982    Years since quitting: 35.9  . Smokeless tobacco: Never Used  Substance and Sexual Activity  . Alcohol use: Yes    Alcohol/week: 28.0 standard drinks    Types: 28 Cans of beer per week    Comment: "3-4 beers or hard apple ciders/ day" 01/10/2018  . Drug use: No  . Sexual activity: Not on file  Lifestyle  . Physical activity:    Days per week: Patient refused    Minutes per session: Patient refused  . Stress: Only a little  Relationships  . Social connections:    Talks on phone: Not on file    Gets together: Not on file    Attends religious  service: Not on file    Active member of club or organization: Not on file    Attends meetings of clubs or organizations: Not on file    Relationship status: Not on file  Other Topics Concern  . Not on file  Social History Narrative  . Not on file   Family History  Problem Relation Age of Onset  . Heart failure Mother        heart disease  . Heart disease Mother   . Heart failure Father        heart disease  . Heart disease Father   . Heart disease Brother   . Heart failure Brother        "  bad heart valve"    ROS: Currently denies lightheadedness, dizziness, Fever, chills, CP, SOB.   No personal history of DVT, PE, or CVA. No loose teeth or dentures All other systems have been reviewed and were otherwise currently negative with the exception of those mentioned in the HPI and as above.  Objective: Vitals: Ht: 5 feet 4 inches wt: 151 temp: 97.9 BP: 130/66 pulse: 73 O2 97 % on room air.   Physical Exam: General: Alert, NAD.  Antalgic Gait  HEENT: EOMI, Good Neck Extension  Pulm: No increased work of breathing.  Clear B/L A/P w/o crackle or wheeze.  CV: RRR, 2/6 SEM RUSB GI: soft, NT, ND Neuro: Neuro without gross focal deficit.  Sensation intact distally Skin: No lesions in the area of chief complaint MSK/Surgical Site: Left knee w/o redness.  Minimal effusion.  Positive JLT. ROM 0-115.  5/5 strength in extension and flexion.  +EHL/FHL.  NVI.  Stable varus and valgus stress.    Imaging Review Plain radiographs demonstrate severe degenerative joint disease of both knees.   Preoperative templating of the joint replacement has been completed, documented, and submitted to the Operating Room personnel in order to optimize intra-operative equipment management.  Assessment: OA LEFT KNEE Principal Problem:   Primary osteoarthritis of left knee Active Problems:   CAD (coronary artery disease)   Hyperlipidemia   Essential hypertension   Nonrheumatic aortic valve stenosis    Macrocytic anemia   AAA (abdominal aortic aneurysm) (H[facility])   BPH (benign prostatic hyperplasia)   Seborrheic eczema of scalp   Plan: Plan for Procedure(s): TOTAL KNEE ARTHROPLASTY  The patient history, physical exam, clinical judgement of the provider and imaging are consistent with end stage degenerative joint disease and total joint arthroplasty is deemed medically necessary. The treatment options including medical management, injection therapy, and arthroplasty were discussed at length. The risks and benefits of Procedure(s): TOTAL KNEE ARTHROPLASTY were presented and reviewed.  The risks of nonoperative treatment, versus surgical intervention including but not limited to continued pain, aseptic loosening, stiffness, dislocation/subluxation, infection, bleeding, nerve injury, blood clots, cardiopulmonary complications, morbidity, mortality, among others were discussed. The patient verbalizes understanding and wishes to proceed with the plan.  Patient is being admitted for inpatient treatment for surgery, pain control, PT, OT, prophylactic antibiotics, VTE prophylaxis, progressive ambulation, ADL's and discharge planning.   Dental prophylaxis discussed and recommended for 2 years postoperatively.   The patient does meet the criteria for TXA which will be used perioperatively.    ASA 81 mg BID will be used postoperatively for DVT prophylaxis in addition to SCDs, and early ambulation.  Plan for Tylenol, oxycodone, gabapentin for pain.  No p.o. NSAIDs due to heart history.  Continue home constipation medicine.  Insurance approval: Inpatient  The patient is planning to be discharged home with home health services ([facility]) in care of his wife.  Anticipated LOS anticipated to be less than than 2 midnights, but approved for inpatient surgery: - Age 6 and older with one or more of the following:  - Obesity  - Expected need for hospital services (PT, OT, Nursing) required for safe   discharge  - Anticipated need for postoperative skilled nursing care or inpatient rehab  - Active co-morbidities: Coronary Artery Disease   Prudencio Burly III, PA-C 10/18/2018 12:40 PM

## 2018-10-31 NOTE — Patient Instructions (Signed)
Corey Garcia  10/31/2018    Your procedure is scheduled on: 11-08-18   Report to [facility] Main  Entrance             Report to admitting at        0730 AM    Call this number if you have problems the morning of surgery [phone]    Remember: Do not eat food or drink liquids :After Midnight  . BRUSH YOUR TEETH MORNING OF SURGERY AND RINSE YOUR MOUTH OUT, NO CHEWING GUM CANDY OR MINTS.     Take these medicines the morning of surgery with A SIP OF WATER: flomax                                You may not have any metal on your body including hair pins and              piercings  Do not wear jewelry, lotions, powders or perfumes, deodorant                     Men may shave face and neck.   Do not bring valuables to the hospital. [place].  Contacts, dentures or bridgework may not be worn into surgery.  Leave suitcase in the car. After surgery it may be brought to your room.                Please read over the following fact sheets you were given: _____________________________________________________________________           [facility] - Preparing for Surgery Before surgery, you can play an important role.  Because skin is not sterile, your skin needs to be as free of germs as possible.  You can reduce the number of germs on your skin by washing with CHG (chlorahexidine gluconate) soap before surgery.  CHG is an antiseptic cleaner which kills germs and bonds with the skin to continue killing germs even after washing. Please DO NOT use if you have an allergy to CHG or antibacterial soaps.  If your skin becomes reddened/irritated stop using the CHG and inform your nurse when you arrive at Short Stay. Do not shave (including legs and underarms) for at least 48 hours prior to the first CHG shower.  You may shave your face/neck. Please follow these instructions carefully:  1.  Shower with CHG Soap the  night before surgery and the  morning of Surgery.  2.  If you choose to wash your hair, wash your hair first as usual with your  normal  shampoo.  3.  After you shampoo, rinse your hair and body thoroughly to remove the  shampoo.                           4.  Use CHG as you would any other liquid soap.  You can apply chg directly  to the skin and wash                       Gently with a scrungie or clean washcloth.  5.  Apply the CHG Soap to your body ONLY FROM THE NECK DOWN.  Do not use on face/ open                           Wound or open sores. Avoid contact with eyes, ears mouth and genitals (private parts).                       Wash face,  Genitals (private parts) with your normal soap.             6.  Wash thoroughly, paying special attention to the area where your surgery  will be performed.  7.  Thoroughly rinse your body with warm water from the neck down.  8.  DO NOT shower/wash with your normal soap after using and rinsing off  the CHG Soap.                9.  Pat yourself dry with a clean towel.            10.  Wear clean pajamas.            11.  Place clean sheets on your bed the night of your first shower and do not  sleep with pets. Day of Surgery : Do not apply any lotions/deodorants the morning of surgery.  Please wear clean clothes to the hospital/surgery center.  FAILURE TO FOLLOW THESE INSTRUCTIONS MAY RESULT IN THE CANCELLATION OF YOUR SURGERY PATIENT SIGNATURE_________________________________  NURSE SIGNATURE__________________________________  ________________________________________________________________________   [name]  An incentive spirometer is a tool that can help keep your lungs clear and active. This tool measures how well you are filling your lungs with each breath. Taking long deep breaths may help reverse or decrease the chance of developing breathing (pulmonary) problems (especially infection) following:  A long period of time when you  are unable to move or be active. BEFORE THE PROCEDURE   If the spirometer includes an indicator to show your best effort, your nurse or respiratory therapist will set it to a desired goal.  If possible, sit up straight or lean slightly forward. Try not to slouch.  Hold the incentive spirometer in an upright position. INSTRUCTIONS FOR USE  1. Sit on the edge of your bed if possible, or sit up as far as you can in bed or on a chair. 2. Hold the incentive spirometer in an upright position. 3. Breathe out normally. 4. Place the mouthpiece in your mouth and seal your lips tightly around it. 5. Breathe in slowly and as deeply as possible, raising the piston or the ball toward the top of the column. 6. Hold your breath for 3-5 seconds or for as long as possible. Allow the piston or ball to fall to the bottom of the column. 7. Remove the mouthpiece from your mouth and breathe out normally. 8. Rest for a few seconds and repeat Steps 1 through 7 at least 10 times every 1-2 hours when you are awake. Take your time and take a few normal breaths between deep breaths. 9. The spirometer may include an indicator to show your best effort. Use the indicator as a goal to work toward during each repetition. 10. After each set of 10 deep breaths, practice coughing to be sure your lungs are clear. If you have an incision (the cut made at the time of surgery), support your incision when coughing by placing a pillow or rolled up towels firmly against it. Once you are able to get out of  bed, walk around indoors and cough well. You may stop using the incentive spirometer when instructed by your caregiver.  RISKS AND COMPLICATIONS  Take your time so you do not get dizzy or light-headed.  If you are in pain, you may need to take or ask for pain medication before doing incentive spirometry. It is harder to take a deep breath if you are having pain. AFTER USE  Rest and breathe slowly and easily.  It can be helpful to  keep track of a log of your progress. Your caregiver can provide you with a simple table to help with this. If you are using the spirometer at home, follow these instructions: [place] IF:   You are having difficultly using the spirometer.  You have trouble using the spirometer as often as instructed.  Your pain medication is not giving enough relief while using the spirometer.  You develop fever of 100.5 F (38.1 C) or higher. SEEK IMMEDIATE MEDICAL CARE IF:   You cough up bloody sputum that had not been present before.  You develop fever of 102 F (38.9 C) or greater.  You develop worsening pain at or near the incision site. MAKE SURE YOU:   Understand these instructions.  Will watch your condition.  Will get help right away if you are not doing well or get worse. Document Released: 12/28/2006 Document Revised: 11/09/2011 Document Reviewed: 02/28/2007 [facility] Patient Information 2014 [place], [place].   ________________________________________________________________________

## 2018-10-31 NOTE — Progress Notes (Signed)
Clearance Dr. Sallyanne Kuster 07-07-18 epic Clearance Dr. Alvy Bimler 07-07-18 epic  EKG 07-07-18 epic  Echo 11-24-2016 epic

## 2018-11-01 ENCOUNTER — Encounter (HOSPITAL_COMMUNITY)
Admission: RE | Admit: 2018-11-01 | Discharge: 2018-11-01 | Disposition: A | Discharge status: Home / Self Care | Payer: PPO | Source: Ambulatory Visit | Attending: Family Medicine | Admitting: Family Medicine

## 2018-11-01 ENCOUNTER — Other Ambulatory Visit: Payer: Self-pay

## 2018-11-01 ENCOUNTER — Encounter (HOSPITAL_COMMUNITY): Payer: Self-pay

## 2018-11-01 DIAGNOSIS — Z01812 Encounter for preprocedural laboratory examination: Secondary | ICD-10-CM | POA: Diagnosis not present

## 2018-11-01 LAB — BASIC METABOLIC PANEL
Anion gap: 8 (ref 5–15)
BUN: 19 mg/dL (ref 8–23)
CO2: 26 mmol/L (ref 22–32)
CREATININE: 0.97 mg/dL (ref 0.61–1.24)
Calcium: 9.3 mg/dL (ref 8.9–10.3)
Chloride: 101 mmol/L (ref 98–111)
GFR calc Af Amer: >60 mL/min (ref >60)
GFR calc non Af Amer: >60 mL/min (ref >60)
Glucose, Bld: 88 mg/dL (ref 70–99)
Potassium: 4 mmol/L (ref 3.5–5.1)
Sodium: 135 mmol/L (ref 135–145)

## 2018-11-01 LAB — SURGICAL PCR SCREEN
MRSA, PCR: NEGATIVE
Staphylococcus aureus: NEGATIVE

## 2018-11-01 LAB — CBC
HCT: 39.4 % (ref 39.0–52.0)
Hemoglobin: 12.4 g/dL — ABNORMAL LOW (ref 13.0–17.0)
MCH: 32.9 pg (ref 26.0–34.0)
MCHC: 31.5 g/dL (ref 30.0–36.0)
MCV: 104.5 fL — ABNORMAL HIGH (ref 80.0–100.0)
Platelets: 193 10*3/uL (ref 150–400)
RBC: 3.77 MIL/uL — ABNORMAL LOW (ref 4.22–5.81)
RDW: 12.1 % (ref 11.5–15.5)
WBC: 5 10*3/uL (ref 4.0–10.5)
nRBC: 0 % (ref 0.0–0.2)

## 2018-11-01 NOTE — Progress Notes (Signed)
PCP: [name]  CARDIOLOGIST:Croitoru  INFO IN Epic:  AA repair 12-2017, mild aortic stenosis, cardiac stents 2009  INFO ON CHART:  BLOOD THINNERS AND LAST DOSES: 81 mg asa last dose 11-02-18  ____________________________________  PATIENT SYMPTOMS AT TIME OF PREOP:none

## 2018-11-03 NOTE — Progress Notes (Signed)
Anesthesia Chart Review    Case:  [id number] Date/Time:  11/08/18 0945   Procedure:  TOTAL KNEE ARTHROPLASTY (Left )   Anesthesia type:  Choice   Pre-op diagnosis:  OA LEFT KNEE   Location:  [facility] ROOM 08 / [facility]S   Surgeon:  Renette Butters, MD      DISCUSSION: 73 yo former smoker (quit 10/31/82) with h/o HTN, liver disease due to alcoholism, anemia, GERD, CHF, mild aortic stenosis (Mean gradient (S): 11 mm Hg), AA repair 12/2017, PVD, CAD (MI, cardiac stents 2009), left knee OA scheduled for above procedure 11/08/18 with Dr. Edmonia Lynch.    Pt last seen by hematology/oncology 10/06/18 (followed for anemia), seen by Dr. [name].  Per her note, "There is no contraindication for him to proceed for surgery from my stand point"  S/p AAA repair 12/2017.  Last seen by vascular surgeon, Dr. Deitra Mayo, on 09/07/18.  Stable at this visit, repeat CT stable, 6 month follow up recommended.   Pt last seen by cardiologist, Dr. Sanda Klein, 07/07/18.  Per this note, "At this point he is at low risk for major cardiovascular complications with planned knee replacement surgery.  I would not recommend additional evaluation as long as he does not develop new symptoms between now with the planned procedure.  It will be important to continue with his bisoprolol without interruption throughout the entire perioperative period. Chart reviewed as part of pre-operative protocol coverage. Given past medical history and time since last visit, based on ACC/AHA guidelines, Corey Garcia would be at acceptable risk for the planned procedure without further cardiovascular testing."  Pt can proceed with planned procedure barring acute status change.  VS: BP (!) 142/62   Pulse 66   Temp 36.8 C (Oral)   Resp 16   Ht 5\' 4"  (1.626 m)   Wt 67.1 kg   SpO2 98%   BMI 25.40 kg/m   PROVIDERS: [name], MD is PCP   [name], MD with hematology/oncolgy  Deitra Mayo, MD is Vascular  surgeon  Croitoru, Dani Gobble, MD is Cardiologist  LABS: Labs reviewed: Acceptable for surgery. (all labs ordered are listed, but only abnormal results are displayed)  Labs Reviewed  CBC - Abnormal; Notable for the following components:      Result Value   RBC 3.77 (*)    Hemoglobin 12.4 (*)    MCV 104.5 (*)    All other components within normal limits  SURGICAL PCR SCREEN  BASIC METABOLIC PANEL     IMAGES: CT Angio Abdomen Pelvis 09/20/2018 IMPRESSION: 1. Slight decrease in aneurysm sac size at the level of isolated saccular infrarenal aortic aneurysm with maximal aortic diameter now measuring 3.2 cm. 2. Persistent endoleak in the inferior and anterior aspect of the aneurysm sac, likely a type II endoleak. Most likely inflow is felt to be from retrograde flow in the inferior mesenteric artery given location of the sac perfusion on the arterial phase. There are some small visualized lumbar arteries as well which could be acting as inflow or outflow but are not in close proximity to the area contrast opacification. Although persistent, this endoleak is not resulting in any increase in aneurysm sac size. 3. Stable aneurysmal disease at the origin of the right common iliac artery measuring 1.7 cm and in the mid to distal right common iliac artery measuring 2.5 cm.  EKG: 07/07/18 Rate 66 bpm Normal sinus rhythm  Normal ECG  CV: Echo 11/24/16 Study Conclusions  - Left  ventricle: The cavity size was normal. Wall thickness was   normal. Systolic function was normal. The estimated ejection   fraction was in the range of 60% to 65%. Wall motion was normal;   there were no regional wall motion abnormalities. Doppler   parameters are consistent with abnormal left ventricular   relaxation (grade 1 diastolic dysfunction). - Aortic valve: Trileaflet; moderately calcified leaflets. There   was mild stenosis. Mean gradient (S): 11 mm Hg. Peak gradient   (S): 21 mm Hg. - Mitral  valve: Mildly calcified annulus. There was trivial   regurgitation. - Right ventricle: The cavity size was normal. Systolic function   was normal. - Tricuspid valve: Peak RV-RA gradient (S): 21 mm Hg. - Pulmonary arteries: PA peak pressure: 24 mm Hg (S). - Inferior vena cava: The vessel was normal in size. The   respirophasic diameter changes were in the normal range (>= 50%),   consistent with normal central venous pressure.  Impressions:  - Normal LV size with EF 60-65%. Normal RV size and systolic   function. Mild aortic stenosis.  Stress Test 12/22/2012 Overall Impression:  Normal stress nuclear study. Low risk stress nuclear study. Past Medical History:  Diagnosis Date  . Alcoholism ([place]) 03/15/2014  . Anemia   . Arthritis    all over, some ra  . Bradycardia by electrocardiogram    BB decreased 11/12/11  . CAD (coronary artery disease)    last cath=01/2008, PCI  . CHF (congestive heart failure) ([place])   . Dyslipidemia   . ED (erectile dysfunction)    viagra helps  . GERD (gastroesophageal reflux disease)   . HTN (hypertension)   . Iron overload 03/15/2014  . Liver disease due to alcohol ([place]) 03/15/2014   pt .denies at preop  . Murmur, cardiac    faint early systolic grade 1/6 aortic ejection murmur  . Myocardial infarction ([place])   . Peripheral vascular disease ([place])   . Rib pain 04/16/2014  . Skin cancer    basal cell ca X 4    Past Surgical History:  Procedure Laterality Date  . ABDOMINAL AORTIC ENDOVASCULAR STENT GRAFT N/A 01/13/2018   Procedure: ABDOMINAL AORTIC ENDOVASCULAR STENT GRAFT;  Surgeon: Angelia Mould, MD;  Location: [facility] OR;  Service: Vascular;  Laterality: N/A;  . CORONARY ANGIOPLASTY WITH STENT PLACEMENT  01/31/2008   PCI with cutting balloon arthrectomy of the ostium of the circ obtuse marg 2 vessel  . CORONARY ANGIOPLASTY WITH STENT PLACEMENT  01/27/08   nl LV function, PTCA/stenting of LAD with 3x94mm Cypher post dilated 3.48mm  . EYE  SURGERY     cataract bil  . FRACTURE SURGERY     right elbow when 73 years old  . HERNIA REPAIR    . KNEE ARTHROSCOPY Left   . MOHS SURGERY     x4  . NASAL SEPTUM SURGERY     x2    MEDICATIONS: . acetaminophen (TYLENOL) 650 MG CR tablet  . amLODipine (NORVASC) 2.5 MG tablet  . aspirin 81 MG tablet  . atorvastatin (LIPITOR) 20 MG tablet  . bisoprolol (ZEBETA) 10 MG tablet  . cholecalciferol (VITAMIN D) 1000 units tablet  . diclofenac sodium (VOLTAREN) 1 % GEL  . hydrocortisone (PROCTO-MED HC) 2.5 % rectal cream  . loratadine (CLARITIN) 10 MG tablet  . Multiple Vitamins-Minerals (ONE DAILY MULTIVITAMIN MEN PO)  . pantoprazole (PROTONIX) 40 MG tablet  . Probiotic Product (PROBIOTIC PO)  . tacrolimus (PROTOPIC) 0.03 % ointment  . tamsulosin (FLOMAX)  0.4 MG CAPS capsule   No current facility-administered medications for this encounter.     Maia Plan [facility] Pre-Surgical Testing [phone] 11/07/18 9:35 AM

## 2018-11-07 MED ORDER — BUPIVACAINE LIPOSOME 1.3 % IJ SUSP
20.0000 mL | Freq: Once | INTRAMUSCULAR | Status: DC
  Filled 2018-11-07: qty 20

## 2018-11-07 NOTE — Progress Notes (Signed)
Patient called to go over his medications to take the morning of surgery and stated that he was concerned that he was only given instructions to take his Flomax. Patient instructed to take Amlodipine (Norvasc), Bisoprolol (Zebeta), Pantoprazole (Protonix), Tamulosin (Flomax), Atorvastatin (Lipitor) with  a sip of water . Patient verbalized understanding.

## 2018-11-07 NOTE — Anesthesia Preprocedure Evaluation (Addendum)
Anesthesia Evaluation  Patient identified by MRN, date of birth, ID band Patient awake    Reviewed: Allergy & Precautions, NPO status , Patient's Chart, lab work & pertinent test results  Airway Mallampati: II  TM Distance: >3 FB Neck ROM: Full    Dental no notable dental hx.    Pulmonary neg pulmonary ROS, former smoker,    Pulmonary exam normal breath sounds clear to auscultation       Cardiovascular hypertension, + CAD, + Past MI, + Cardiac Stents and + Peripheral Vascular Disease  Normal cardiovascular exam+ Valvular Problems/Murmurs AS  Rhythm:Regular Rate:Normal + Systolic murmurs    Neuro/Psych negative neurological ROS  negative psych ROS   GI/Hepatic negative GI ROS, Neg liver ROS,   Endo/Other  negative endocrine ROS  Renal/GU negative Renal ROS  negative genitourinary   Musculoskeletal negative musculoskeletal ROS (+)   Abdominal   Peds negative pediatric ROS (+)  Hematology negative hematology ROS (+)   Anesthesia Other Findings   Reproductive/Obstetrics negative OB ROS                           Anesthesia Physical Anesthesia Plan  ASA: III  Anesthesia Plan: Spinal   Post-op Pain Management:    Induction: Intravenous  PONV Risk Score and Plan:   Airway Management Planned: Simple Face Mask  Additional Equipment:   Intra-op Plan:   Post-operative Plan:   Informed Consent: I have reviewed the patients History and Physical, chart, labs and discussed the procedure including the risks, benefits and alternatives for the proposed anesthesia with the patient or authorized representative who has indicated his/her understanding and acceptance.     Dental advisory given  Plan Discussed with: CRNA and Surgeon  Anesthesia Plan Comments: (See PAT note 11/01/18, Konrad Felix, PA-C Mild AS should be ok for low dose spinal)      Anesthesia Quick Evaluation

## 2018-11-08 ENCOUNTER — Encounter (HOSPITAL_COMMUNITY)
Admission: RE | Disposition: A | Discharge status: Home Health | Payer: Self-pay | Source: Home / Self Care | Attending: Orthopedic Surgery

## 2018-11-08 ENCOUNTER — Inpatient Hospital Stay (HOSPITAL_COMMUNITY)
Admission: RE | Admit: 2018-11-08 | Discharge: 2018-11-09 | DRG: 470 | Disposition: A | Discharge status: Home Health | Payer: PPO | Attending: Orthopedic Surgery | Admitting: Orthopedic Surgery

## 2018-11-08 ENCOUNTER — Inpatient Hospital Stay (HOSPITAL_COMMUNITY): Payer: PPO | Admitting: Physician Assistant

## 2018-11-08 ENCOUNTER — Inpatient Hospital Stay (HOSPITAL_COMMUNITY): Payer: PPO | Admitting: Anesthesiology

## 2018-11-08 ENCOUNTER — Inpatient Hospital Stay (HOSPITAL_COMMUNITY): Payer: PPO

## 2018-11-08 ENCOUNTER — Other Ambulatory Visit: Payer: Self-pay

## 2018-11-08 ENCOUNTER — Encounter (HOSPITAL_COMMUNITY): Payer: Self-pay | Admitting: *Deleted

## 2018-11-08 DIAGNOSIS — M1710 Unilateral primary osteoarthritis, unspecified knee: Secondary | ICD-10-CM | POA: Diagnosis present

## 2018-11-08 DIAGNOSIS — K219 Gastro-esophageal reflux disease without esophagitis: Secondary | ICD-10-CM | POA: Diagnosis not present

## 2018-11-08 DIAGNOSIS — I714 Abdominal aortic aneurysm, without rupture, unspecified: Secondary | ICD-10-CM | POA: Diagnosis present

## 2018-11-08 DIAGNOSIS — I110 Hypertensive heart disease with heart failure: Secondary | ICD-10-CM | POA: Diagnosis present

## 2018-11-08 DIAGNOSIS — F1021 Alcohol dependence, in remission: Secondary | ICD-10-CM | POA: Diagnosis present

## 2018-11-08 DIAGNOSIS — Z96652 Presence of left artificial knee joint: Secondary | ICD-10-CM | POA: Diagnosis not present

## 2018-11-08 DIAGNOSIS — I2510 Atherosclerotic heart disease of native coronary artery without angina pectoris: Secondary | ICD-10-CM | POA: Diagnosis present

## 2018-11-08 DIAGNOSIS — M1712 Unilateral primary osteoarthritis, left knee: Principal | ICD-10-CM | POA: Diagnosis present

## 2018-11-08 DIAGNOSIS — Z85828 Personal history of other malignant neoplasm of skin: Secondary | ICD-10-CM | POA: Diagnosis not present

## 2018-11-08 DIAGNOSIS — I25118 Atherosclerotic heart disease of native coronary artery with other forms of angina pectoris: Secondary | ICD-10-CM

## 2018-11-08 DIAGNOSIS — L210 Seborrhea capitis: Secondary | ICD-10-CM | POA: Diagnosis present

## 2018-11-08 DIAGNOSIS — M25562 Pain in left knee: Secondary | ICD-10-CM | POA: Diagnosis present

## 2018-11-08 DIAGNOSIS — Z471 Aftercare following joint replacement surgery: Secondary | ICD-10-CM | POA: Diagnosis not present

## 2018-11-08 DIAGNOSIS — Z9861 Coronary angioplasty status: Secondary | ICD-10-CM

## 2018-11-08 DIAGNOSIS — D539 Nutritional anemia, unspecified: Secondary | ICD-10-CM | POA: Diagnosis present

## 2018-11-08 DIAGNOSIS — G8918 Other acute postprocedural pain: Secondary | ICD-10-CM | POA: Diagnosis not present

## 2018-11-08 DIAGNOSIS — I350 Nonrheumatic aortic (valve) stenosis: Secondary | ICD-10-CM

## 2018-11-08 DIAGNOSIS — I509 Heart failure, unspecified: Secondary | ICD-10-CM | POA: Diagnosis not present

## 2018-11-08 DIAGNOSIS — N400 Enlarged prostate without lower urinary tract symptoms: Secondary | ICD-10-CM | POA: Diagnosis present

## 2018-11-08 DIAGNOSIS — I10 Essential (primary) hypertension: Secondary | ICD-10-CM | POA: Diagnosis present

## 2018-11-08 DIAGNOSIS — Z96659 Presence of unspecified artificial knee joint: Secondary | ICD-10-CM

## 2018-11-08 DIAGNOSIS — E785 Hyperlipidemia, unspecified: Secondary | ICD-10-CM | POA: Diagnosis present

## 2018-11-08 DIAGNOSIS — Z87891 Personal history of nicotine dependence: Secondary | ICD-10-CM

## 2018-11-08 DIAGNOSIS — E7800 Pure hypercholesterolemia, unspecified: Secondary | ICD-10-CM

## 2018-11-08 DIAGNOSIS — L219 Seborrheic dermatitis, unspecified: Secondary | ICD-10-CM | POA: Diagnosis present

## 2018-11-08 HISTORY — PX: TOTAL KNEE ARTHROPLASTY: SHX125

## 2018-11-08 SURGERY — ARTHROPLASTY, KNEE, TOTAL
Anesthesia: Spinal | Site: Knee | Laterality: Left

## 2018-11-08 MED ORDER — BUPIVACAINE IN DEXTROSE 0.75-8.25 % IT SOLN
INTRATHECAL | Status: DC | PRN
  Administered 2018-11-08: 1.6 mL via INTRATHECAL

## 2018-11-08 MED ORDER — BUPIVACAINE LIPOSOME 1.3 % IJ SUSP
INTRAMUSCULAR | Status: DC | PRN
  Administered 2018-11-08: 20 mL

## 2018-11-08 MED ORDER — PROPOFOL 10 MG/ML IV BOLUS
INTRAVENOUS | Status: AC
  Filled 2018-11-08: qty 20

## 2018-11-08 MED ORDER — GABAPENTIN 300 MG PO CAPS: 300.0000 mg | ORAL_CAPSULE | Freq: Three times a day (TID) | ORAL | 0 refills | Status: DC

## 2018-11-08 MED ORDER — SODIUM CHLORIDE (PF) 0.9 % IJ SOLN
INTRAMUSCULAR | Status: AC
  Filled 2018-11-08: qty 50

## 2018-11-08 MED ORDER — PROPOFOL 10 MG/ML IV BOLUS
INTRAVENOUS | Status: AC
  Filled 2018-11-08: qty 40

## 2018-11-08 MED ORDER — STERILE WATER FOR IRRIGATION IR SOLN
Status: DC | PRN
  Administered 2018-11-08: 2000 mL

## 2018-11-08 MED ORDER — LACTATED RINGERS IV SOLN
INTRAVENOUS | Status: DC
  Administered 2018-11-08 (×2): via INTRAVENOUS

## 2018-11-08 MED ORDER — DIPHENHYDRAMINE HCL 12.5 MG/5ML PO ELIX: 12.5000 mg | ORAL_SOLUTION | ORAL | Status: DC | PRN

## 2018-11-08 MED ORDER — CEFAZOLIN SODIUM-DEXTROSE 2-4 GM/100ML-% IV SOLN
2.0000 g | Freq: Four times a day (QID) | INTRAVENOUS | Status: AC
  Administered 2018-11-08 (×2): 2 g via INTRAVENOUS
  Filled 2018-11-08 (×2): qty 100

## 2018-11-08 MED ORDER — SODIUM CHLORIDE 0.9 % IV SOLN
INTRAVENOUS | Status: DC | PRN
  Administered 2018-11-08: 25 ug/min via INTRAVENOUS

## 2018-11-08 MED ORDER — POVIDONE-IODINE 10 % EX SWAB
2.0000 "application " | Freq: Once | CUTANEOUS | Status: AC
  Administered 2018-11-08: 2 via TOPICAL

## 2018-11-08 MED ORDER — METHOCARBAMOL 500 MG PO TABS: 500.0000 mg | ORAL_TABLET | Freq: Three times a day (TID) | ORAL | 0 refills | Status: DC | PRN

## 2018-11-08 MED ORDER — LIDOCAINE HCL (CARDIAC) PF 100 MG/5ML IV SOSY
PREFILLED_SYRINGE | INTRAVENOUS | Status: DC | PRN
  Administered 2018-11-08: 60 mg via INTRAVENOUS

## 2018-11-08 MED ORDER — ASPIRIN 81 MG PO CHEW
81.0000 mg | CHEWABLE_TABLET | Freq: Two times a day (BID) | ORAL | Status: DC
  Administered 2018-11-08 – 2018-11-09 (×2): 81 mg via ORAL
  Filled 2018-11-08 (×2): qty 1

## 2018-11-08 MED ORDER — PHENYLEPHRINE HCL 10 MG/ML IJ SOLN
INTRAMUSCULAR | Status: AC
  Filled 2018-11-08: qty 1

## 2018-11-08 MED ORDER — SORBITOL 70 % SOLN
30.0000 mL | Freq: Every day | Status: DC | PRN
  Filled 2018-11-08: qty 30

## 2018-11-08 MED ORDER — ACETAMINOPHEN 325 MG PO TABS: 325.0000 mg | ORAL_TABLET | Freq: Four times a day (QID) | ORAL | Status: DC | PRN

## 2018-11-08 MED ORDER — DEXAMETHASONE SODIUM PHOSPHATE 10 MG/ML IJ SOLN
10.0000 mg | Freq: Once | INTRAMUSCULAR | Status: AC
  Administered 2018-11-09: 10 mg via INTRAVENOUS
  Filled 2018-11-08: qty 1

## 2018-11-08 MED ORDER — ONDANSETRON HCL 4 MG/2ML IJ SOLN
INTRAMUSCULAR | Status: DC | PRN
  Administered 2018-11-08: 4 mg via INTRAVENOUS

## 2018-11-08 MED ORDER — ONDANSETRON HCL 4 MG PO TABS: 4.0000 mg | ORAL_TABLET | Freq: Three times a day (TID) | ORAL | 0 refills | Status: DC | PRN

## 2018-11-08 MED ORDER — MIDAZOLAM HCL 2 MG/2ML IJ SOLN
INTRAMUSCULAR | Status: AC
  Administered 2018-11-08: 1 mg via INTRAVENOUS
  Filled 2018-11-08: qty 2

## 2018-11-08 MED ORDER — LACTATED RINGERS IV SOLN
INTRAVENOUS | Status: DC
  Administered 2018-11-08: 14:00:00 via INTRAVENOUS

## 2018-11-08 MED ORDER — METHOCARBAMOL 500 MG IVPB - SIMPLE MED
500.0000 mg | Freq: Four times a day (QID) | INTRAVENOUS | Status: DC | PRN
  Filled 2018-11-08: qty 50

## 2018-11-08 MED ORDER — CEFAZOLIN SODIUM-DEXTROSE 2-4 GM/100ML-% IV SOLN
2.0000 g | INTRAVENOUS | Status: AC
  Administered 2018-11-08: 2 g via INTRAVENOUS
  Filled 2018-11-08: qty 100

## 2018-11-08 MED ORDER — 0.9 % SODIUM CHLORIDE (POUR BTL) OPTIME
TOPICAL | Status: DC | PRN
  Administered 2018-11-08: 1000 mL

## 2018-11-08 MED ORDER — BISOPROLOL FUMARATE 5 MG PO TABS
15.0000 mg | ORAL_TABLET | Freq: Every day | ORAL | Status: DC
  Administered 2018-11-09: 15 mg via ORAL
  Filled 2018-11-08: qty 3

## 2018-11-08 MED ORDER — ACETAMINOPHEN 500 MG PO TABS
1000.0000 mg | ORAL_TABLET | Freq: Once | ORAL | Status: AC
  Administered 2018-11-08: 1000 mg via ORAL
  Filled 2018-11-08: qty 2

## 2018-11-08 MED ORDER — ACETAMINOPHEN 500 MG PO TABS
1000.0000 mg | ORAL_TABLET | Freq: Three times a day (TID) | ORAL | Status: DC
  Administered 2018-11-08 – 2018-11-09 (×3): 1000 mg via ORAL
  Filled 2018-11-08 (×3): qty 2

## 2018-11-08 MED ORDER — SODIUM CHLORIDE 0.9% FLUSH
INTRAVENOUS | Status: DC | PRN
  Administered 2018-11-08: 30 mL

## 2018-11-08 MED ORDER — PHENOL 1.4 % MT LIQD
1.0000 | OROMUCOSAL | Status: DC | PRN
  Filled 2018-11-08: qty 177

## 2018-11-08 MED ORDER — PANTOPRAZOLE SODIUM 40 MG PO TBEC
40.0000 mg | DELAYED_RELEASE_TABLET | Freq: Every day | ORAL | Status: DC
  Administered 2018-11-09: 40 mg via ORAL
  Filled 2018-11-08: qty 1

## 2018-11-08 MED ORDER — ACETAMINOPHEN 500 MG PO TABS: 1000.0000 mg | ORAL_TABLET | Freq: Three times a day (TID) | ORAL | 0 refills | Status: AC

## 2018-11-08 MED ORDER — MAGNESIUM CITRATE PO SOLN: 1.0000 | Freq: Once | ORAL | Status: DC | PRN

## 2018-11-08 MED ORDER — OXYCODONE HCL 5 MG PO TABS: 5.0000 mg | ORAL_TABLET | ORAL | 0 refills | Status: AC | PRN

## 2018-11-08 MED ORDER — SODIUM CHLORIDE 0.9 % IR SOLN
Status: DC | PRN
  Administered 2018-11-08: 1000 mL

## 2018-11-08 MED ORDER — MIDAZOLAM HCL 2 MG/2ML IJ SOLN
1.0000 mg | INTRAMUSCULAR | Status: DC
  Administered 2018-11-08: 1 mg via INTRAVENOUS

## 2018-11-08 MED ORDER — ASPIRIN EC 81 MG PO TBEC: 81.0000 mg | DELAYED_RELEASE_TABLET | Freq: Two times a day (BID) | ORAL | 0 refills | Status: DC

## 2018-11-08 MED ORDER — DOCUSATE SODIUM 100 MG PO CAPS: 100.0000 mg | ORAL_CAPSULE | Freq: Two times a day (BID) | ORAL | 0 refills | Status: DC

## 2018-11-08 MED ORDER — PROPOFOL 500 MG/50ML IV EMUL
INTRAVENOUS | Status: DC | PRN
  Administered 2018-11-08: 100 ug/kg/min via INTRAVENOUS

## 2018-11-08 MED ORDER — PROMETHAZINE HCL 25 MG/ML IJ SOLN: 6.2500 mg | INTRAMUSCULAR | Status: DC | PRN

## 2018-11-08 MED ORDER — METOCLOPRAMIDE HCL 5 MG/ML IJ SOLN: 5.0000 mg | Freq: Three times a day (TID) | INTRAMUSCULAR | Status: DC | PRN

## 2018-11-08 MED ORDER — CHLORHEXIDINE GLUCONATE 4 % EX LIQD: 60.0000 mL | Freq: Once | CUTANEOUS | Status: DC

## 2018-11-08 MED ORDER — METOCLOPRAMIDE HCL 5 MG PO TABS: 5.0000 mg | ORAL_TABLET | Freq: Three times a day (TID) | ORAL | Status: DC | PRN

## 2018-11-08 MED ORDER — POLYETHYLENE GLYCOL 3350 17 G PO PACK: 17.0000 g | PACK | Freq: Every day | ORAL | Status: DC | PRN

## 2018-11-08 MED ORDER — FENTANYL CITRATE (PF) 100 MCG/2ML IJ SOLN
50.0000 ug | INTRAMUSCULAR | Status: DC
  Administered 2018-11-08: 50 ug via INTRAVENOUS

## 2018-11-08 MED ORDER — DOCUSATE SODIUM 100 MG PO CAPS
100.0000 mg | ORAL_CAPSULE | Freq: Two times a day (BID) | ORAL | Status: DC
  Administered 2018-11-08 – 2018-11-09 (×2): 100 mg via ORAL
  Filled 2018-11-08 (×2): qty 1

## 2018-11-08 MED ORDER — GABAPENTIN 300 MG PO CAPS
300.0000 mg | ORAL_CAPSULE | Freq: Once | ORAL | Status: AC
  Administered 2018-11-08: 300 mg via ORAL
  Filled 2018-11-08: qty 1

## 2018-11-08 MED ORDER — TAMSULOSIN HCL 0.4 MG PO CAPS
0.4000 mg | ORAL_CAPSULE | Freq: Every day | ORAL | Status: DC
  Administered 2018-11-09: 0.4 mg via ORAL
  Filled 2018-11-08: qty 1

## 2018-11-08 MED ORDER — BUPIVACAINE-EPINEPHRINE (PF) 0.5% -1:200000 IJ SOLN
INTRAMUSCULAR | Status: DC | PRN
  Administered 2018-11-08: 20 mL via PERINEURAL

## 2018-11-08 MED ORDER — HYDROMORPHONE HCL 1 MG/ML IJ SOLN: 0.2500 mg | INTRAMUSCULAR | Status: DC | PRN

## 2018-11-08 MED ORDER — AMLODIPINE BESYLATE 5 MG PO TABS
2.5000 mg | ORAL_TABLET | Freq: Every day | ORAL | Status: DC
  Administered 2018-11-09: 2.5 mg via ORAL
  Filled 2018-11-08: qty 1

## 2018-11-08 MED ORDER — GABAPENTIN 300 MG PO CAPS
300.0000 mg | ORAL_CAPSULE | Freq: Three times a day (TID) | ORAL | Status: DC
  Administered 2018-11-08 – 2018-11-09 (×3): 300 mg via ORAL
  Filled 2018-11-08 (×3): qty 1

## 2018-11-08 MED ORDER — MENTHOL 3 MG MT LOZG
1.0000 | LOZENGE | OROMUCOSAL | Status: DC | PRN
  Filled 2018-11-08: qty 9

## 2018-11-08 MED ORDER — FENTANYL CITRATE (PF) 100 MCG/2ML IJ SOLN
INTRAMUSCULAR | Status: AC
  Administered 2018-11-08: 50 ug via INTRAVENOUS
  Filled 2018-11-08: qty 2

## 2018-11-08 MED ORDER — ONDANSETRON HCL 4 MG/2ML IJ SOLN
INTRAMUSCULAR | Status: AC
  Filled 2018-11-08: qty 2

## 2018-11-08 MED ORDER — ATORVASTATIN CALCIUM 20 MG PO TABS
20.0000 mg | ORAL_TABLET | Freq: Every day | ORAL | Status: DC
  Administered 2018-11-09: 20 mg via ORAL
  Filled 2018-11-08: qty 1

## 2018-11-08 MED ORDER — ONDANSETRON HCL 4 MG/2ML IJ SOLN: 4.0000 mg | Freq: Four times a day (QID) | INTRAMUSCULAR | Status: DC | PRN

## 2018-11-08 MED ORDER — TRANEXAMIC ACID-NACL 1000-0.7 MG/100ML-% IV SOLN
1000.0000 mg | INTRAVENOUS | Status: AC
  Administered 2018-11-08: 1000 mg via INTRAVENOUS
  Filled 2018-11-08: qty 100

## 2018-11-08 MED ORDER — OXYCODONE HCL 5 MG PO TABS
5.0000 mg | ORAL_TABLET | ORAL | Status: DC | PRN
  Administered 2018-11-08 (×3): 5 mg via ORAL
  Administered 2018-11-09 (×3): 10 mg via ORAL
  Filled 2018-11-08: qty 1
  Filled 2018-11-08: qty 2
  Filled 2018-11-08 (×2): qty 1
  Filled 2018-11-08 (×2): qty 2

## 2018-11-08 MED ORDER — METHOCARBAMOL 500 MG PO TABS
500.0000 mg | ORAL_TABLET | Freq: Four times a day (QID) | ORAL | Status: DC | PRN
  Administered 2018-11-08 – 2018-11-09 (×2): 500 mg via ORAL
  Filled 2018-11-08 (×2): qty 1

## 2018-11-08 MED ORDER — PROPOFOL 500 MG/50ML IV EMUL
INTRAVENOUS | Status: DC | PRN
  Administered 2018-11-08: 20 mg via INTRAVENOUS

## 2018-11-08 MED ORDER — HYDROMORPHONE HCL 1 MG/ML IJ SOLN: 0.5000 mg | INTRAMUSCULAR | Status: DC | PRN

## 2018-11-08 MED ORDER — ONDANSETRON HCL 4 MG PO TABS: 4.0000 mg | ORAL_TABLET | Freq: Four times a day (QID) | ORAL | Status: DC | PRN

## 2018-11-08 SURGICAL SUPPLY — 52 items
APL PRP STRL LF DISP 70% ISPRP (MISCELLANEOUS) ×2
BLADE HEX COATED 2.75 (ELECTRODE) ×3 IMPLANT
BLADE SAG 18X100X1.27 (BLADE) ×3 IMPLANT
BLADE SAGITTAL 25.0X1.37X90 (BLADE) ×2 IMPLANT
BLADE SAGITTAL 25.0X1.37X90MM (BLADE) ×1
BLADE SURG 15 STRL LF DISP TIS (BLADE) ×1 IMPLANT
BLADE SURG 15 STRL SS (BLADE) ×3
BLADE SURG SZ10 CARB STEEL (BLADE) ×6 IMPLANT
BNDG CMPR MED 10X6 ELC LF (GAUZE/BANDAGES/DRESSINGS) ×1
BNDG ELASTIC 6X10 VLCR STRL LF (GAUZE/BANDAGES/DRESSINGS) ×3 IMPLANT
BOWL SMART MIX CTS (DISPOSABLE) IMPLANT
BSPLAT TIB 4 KN TRITANIUM (Knees) ×1 IMPLANT
CHLORAPREP W/TINT 26 (MISCELLANEOUS) ×6 IMPLANT
CLOSURE STERI-STRIP 1/2X4 (GAUZE/BANDAGES/DRESSINGS) ×2
CLSR STERI-STRIP ANTIMIC 1/2X4 (GAUZE/BANDAGES/DRESSINGS) ×3 IMPLANT
COVER SURGICAL LIGHT HANDLE (MISCELLANEOUS) ×3 IMPLANT
COVER WAND RF STERILE (DRAPES) IMPLANT
CUFF TOURN SGL QUICK 34 (TOURNIQUET CUFF) ×3
CUFF TRNQT CYL 34X4.125X (TOURNIQUET CUFF) ×1 IMPLANT
DECANTER SPIKE VIAL GLASS SM (MISCELLANEOUS) IMPLANT
DRAPE U-SHAPE 47X51 STRL (DRAPES) ×3 IMPLANT
DRSG MEPILEX BORDER 4X12 (GAUZE/BANDAGES/DRESSINGS) ×3 IMPLANT
FEMORAL POSTERIOR SZ3 LT KNEE (Orthopedic Implant) IMPLANT
GLOVE BIO SURGEON STRL SZ7.5 (GLOVE) ×6 IMPLANT
GLOVE BIOGEL PI IND STRL 8 (GLOVE) ×2 IMPLANT
GLOVE BIOGEL PI INDICATOR 8 (GLOVE) ×4
GOWN STRL REUS W/ TWL LRG LVL3 (GOWN DISPOSABLE) ×2 IMPLANT
GOWN STRL REUS W/TWL LRG LVL3 (GOWN DISPOSABLE) ×6
HANDPIECE INTERPULSE COAX TIP (DISPOSABLE)
HOLDER FOLEY CATH W/STRAP (MISCELLANEOUS) IMPLANT
IMMOBILIZER KNEE 20 (SOFTGOODS) ×2 IMPLANT
IMMOBILIZER KNEE 22 UNIV (SOFTGOODS) ×3 IMPLANT
INSERT TIBIA BEAR SZ4 9 KNEE (Knees) ×2 IMPLANT
KIT TURNOVER KIT A (KITS) IMPLANT
KNEE PATELLA ASYMMETRIC 9X29 (Knees) ×2 IMPLANT
KNEE TIBIAL COMP TRI SZ4 (Knees) ×2 IMPLANT
MANIFOLD NEPTUNE II (INSTRUMENTS) ×3 IMPLANT
NS IRRIG 1000ML POUR BTL (IV SOLUTION) ×3 IMPLANT
PACK TOTAL KNEE CUSTOM (KITS) ×3 IMPLANT
PIN FLUTED HEDLESS FIX 3.5X1/8 (Miscellaneous) ×2 IMPLANT
POSTERIOR FEMORAL SZ3 LT KNEE (Orthopedic Implant) ×3 IMPLANT
PROTECTOR NERVE ULNAR (MISCELLANEOUS) ×3 IMPLANT
SET HNDPC FAN SPRY TIP SCT (DISPOSABLE) IMPLANT
SUT MNCRL AB 4-0 PS2 18 (SUTURE) ×3 IMPLANT
SUT VIC AB 0 CT1 27 (SUTURE) ×3
SUT VIC AB 0 CT1 27XBRD ANBCTR (SUTURE) ×1 IMPLANT
SUT VIC AB 1 CT1 36 (SUTURE) ×8 IMPLANT
SUT VIC AB 2-0 CT1 27 (SUTURE) ×3
SUT VIC AB 2-0 CT1 TAPERPNT 27 (SUTURE) ×1 IMPLANT
TRAY FOLEY MTR SLVR 16FR STAT (SET/KITS/TRAYS/PACK) ×3 IMPLANT
WRAP KNEE MAXI GEL POST OP (GAUZE/BANDAGES/DRESSINGS) ×2 IMPLANT
YANKAUER SUCT BULB TIP 10FT TU (MISCELLANEOUS) ×3 IMPLANT

## 2018-11-08 NOTE — Anesthesia Procedure Notes (Signed)
Date/Time: 11/08/2018 10:30 AM Performed by: Glory Buff, CRNA Oxygen Delivery Method: Nasal cannula

## 2018-11-08 NOTE — Anesthesia Procedure Notes (Signed)
Date/Time: 11/08/2018 10:49 AM Performed by: Glory Buff, CRNA Oxygen Delivery Method: Simple face mask

## 2018-11-08 NOTE — Anesthesia Procedure Notes (Signed)
Spinal  Patient location during procedure: OR Start time: 11/08/2018 10:35 AM End time: 11/08/2018 10:39 AM Staffing Anesthesiologist: Myrtie Soman, MD Resident/CRNA: Glory Buff, CRNA Performed: resident/CRNA  Preanesthetic Checklist Completed: patient identified, site marked, surgical consent, pre-op evaluation, timeout performed, IV checked, risks and benefits discussed and monitors and equipment checked Spinal Block Patient position: sitting Prep: DuraPrep Patient monitoring: heart rate, continuous pulse ox and blood pressure Approach: midline Location: L3-4 Needle Needle type: Pencan  Needle gauge: 24 G Needle length: 9 cm Needle insertion depth: 5 cm Assessment Sensory level: T6 Events: paresthesia Additional Notes Kit date checked and verified.  Sterile prep and drape, skin local with 1% lidocaine, stick x 1,- heme, paraesthesia, needle pulled back, paraesthesia resolved, + CSF pre and post injection, patient tolerated procedure well.

## 2018-11-08 NOTE — Evaluation (Signed)
Physical Therapy Evaluation Patient Details Name: Corey Garcia MRN: [id number] DOB: 12-Apr-1946 Today's Date: 11/08/2018   History of Present Illness  73 yo male s/p L TKR on 11/08/18. PMH includes alcoholism, anemia, OA and RA, CAD, CHF, HTN, liver disease, MI with coronary angioplasty and stenting, PVD, AAA s/p EVAR 2019.  Clinical Impression  Pt presents with L knee pain, decreased L knee ROM, difficulty performing bed mobility, increased time and effort to transfer/ambulate, and decreased activity tolerance. Pt to benefit from acute PT to address deficits. Pt ambulated 40 ft with RW with min guard assist, verbal cuing for form and safety provided throughout. Pt educated on ankle pumps (20/hour) to perform this afternoon/evening to increase circulation, to pt's tolerance and limited by pain. PT to progress mobility as tolerated, and will continue to follow acutely.        Follow Up Recommendations Follow surgeon's recommendation for DC plan and follow-up therapies;Supervision for mobility/OOB    Equipment Recommendations  3in1 (PT)    Recommendations for Other Services       Precautions / Restrictions Precautions Precautions: Fall Restrictions Weight Bearing Restrictions: No Other Position/Activity Restrictions: WBAT       Mobility  Bed Mobility Overal bed mobility: Needs Assistance Bed Mobility: Supine to Sit     Supine to sit: Min assist;HOB elevated     General bed mobility comments: Min assist for translation of LLE to EOB, increased time and effort.   Transfers Overall transfer level: Needs assistance Equipment used: Rolling walker (2 wheeled) Transfers: Sit to/from Stand Sit to Stand: Min guard;From elevated surface         General transfer comment: Min guard for safety, verbal cuing for hand placement.   Ambulation/Gait Ambulation/Gait assistance: Min guard;+2 safety/equipment Gait Distance (Feet): 40 Feet Assistive device: Rolling walker (2  wheeled) Gait Pattern/deviations: Step-to pattern;Decreased step length - left;Decreased weight shift to left;Antalgic Gait velocity: decr    General Gait Details: Min guard for safety. Verbal cuing for sequencing, placement in RW, turning.   Stairs            Wheelchair Mobility    Modified Rankin (Stroke Patients Only)       Balance Overall balance assessment: Mild deficits observed, not formally tested                                           Pertinent Vitals/Pain Pain Assessment: 0-10 Pain Score: 7  Pain Location: L knee  Pain Descriptors / Indicators: Aching;Dull Pain Intervention(s): Limited activity within patient's tolerance;RN gave pain meds during session;Monitored during session;Repositioned;Ice applied    Home Living Family/patient expects to be discharged to:: Private residence Living Arrangements: Spouse/significant other;Other relatives(grandson ) Available Help at Discharge: Family;Available PRN/intermittently Type of Home: House Home Access: Stairs to enter Entrance Stairs-Rails: Chemical engineer of Steps: 2 Home Layout: One level Home Equipment: Walker - 2 wheels;Cane - single point;Crutches      Prior Function Level of Independence: Independent with assistive device(s)         Comments: uses cane PRN PTA     Hand Dominance   Dominant Hand: Right    Extremity/Trunk Assessment   Upper Extremity Assessment Upper Extremity Assessment: Overall WFL for tasks assessed    Lower Extremity Assessment Lower Extremity Assessment: Overall WFL for tasks assessed;LLE deficits/detail LLE Deficits / Details: suspected post-surgical weakness; able to perform ankle pumps,  quad set, small SLR without lift assist, heel slide  LLE Sensation: WNL    Cervical / Trunk Assessment Cervical / Trunk Assessment: Normal  Communication   Communication: No difficulties  Cognition Arousal/Alertness: Awake/alert Behavior  During Therapy: WFL for tasks assessed/performed Overall Cognitive Status: Within Functional Limits for tasks assessed                                        General Comments      Exercises Total Joint Exercises Goniometric ROM: knee aarom ~10-45*, limited by pain    Assessment/Plan    PT Assessment Patient needs continued PT services  PT Problem List Decreased strength;Decreased mobility;Decreased range of motion;Decreased activity tolerance;Decreased balance;Decreased knowledge of use of DME;Pain       PT Treatment Interventions DME instruction;Functional mobility training;Gait training;Therapeutic activities;Therapeutic exercise;Patient/family education;Stair training;Balance training    PT Goals (Current goals can be found in the Care Plan section)  Acute Rehab PT Goals Patient Stated Goal: none stated  PT Goal Formulation: With patient Time For Goal Achievement: 11/15/18 Potential to Achieve Goals: Good    Frequency 7X/week   Barriers to discharge        Co-evaluation               AM-PAC PT "6 Clicks" Mobility  Outcome Measure Help needed turning from your back to your side while in a flat bed without using bedrails?: A Lot Help needed moving from lying on your back to sitting on the side of a flat bed without using bedrails?: A Lot Help needed moving to and from a bed to a chair (including a wheelchair)?: A Little Help needed standing up from a chair using your arms (e.g., wheelchair or bedside chair)?: A Little Help needed to walk in hospital room?: A Little Help needed climbing 3-5 steps with a railing? : A Lot 6 Click Score: 15    End of Session Equipment Utilized During Treatment: Gait belt Activity Tolerance: Patient tolerated treatment well;Patient limited by pain Patient left: in chair;with chair alarm set;with call bell/phone within reach;with nursing/sitter in room(RN to replace SCDs) Nurse Communication: Mobility status PT  Visit Diagnosis: Other abnormalities of gait and mobility (R26.89);Difficulty in walking, not elsewhere classified (R26.2)    Time: 3244-0102 PT Time Calculation (min) (ACUTE ONLY): 19 min   Charges:   PT Evaluation $PT Eval Low Complexity: 1 Low         Phi Avans Conception Chancy, PT Acute Rehabilitation Services Pager [phone]  Office [phone]   Fabiano Ginley D Elonda Husky 11/08/2018, 6:59 PM

## 2018-11-08 NOTE — Anesthesia Procedure Notes (Signed)
Anesthesia Regional Block: Adductor canal block   Pre-Anesthetic Checklist: ,, timeout performed, Correct Patient, Correct Site, Correct Laterality, Correct Procedure, Correct Position, site marked, Risks and benefits discussed,  Surgical consent,  Pre-op evaluation,  At surgeon's request and post-op pain management  Laterality: Left  Prep: chloraprep       Needles:  Injection technique: Single-shot  Needle Type: Echogenic Needle     Needle Length: 9cm      Additional Needles:   Procedures:,,,, ultrasound used (permanent image in chart),,,,  Narrative:  Start time: 11/08/2018 9:20 AM End time: 11/08/2018 9:28 AM Injection made incrementally with aspirations every 5 mL.  Performed by: Personally  Anesthesiologist: Myrtie Soman, MD  Additional Notes: Patient tolerated the procedure well without complications

## 2018-11-08 NOTE — Op Note (Signed)
DATE OF SURGERY:  11/08/2018 TIME: 11:55 AM  PATIENT NAME:  Corey Garcia   AGE: 73 y.o.    PRE-OPERATIVE DIAGNOSIS:  OA LEFT KNEE  POST-OPERATIVE DIAGNOSIS:  Same  PROCEDURE:  Procedure(s): TOTAL KNEE ARTHROPLASTY   SURGEON:  Renette Butters, MD   ASSISTANT:  Roxan Hockey, PA-C, he was present and scrubbed throughout the case, critical for completion in a timely fashion, and for retraction, instrumentation, and closure.    OPERATIVE IMPLANTS: [employer] Triathlon Posterior Stabilized. Press fit knee  Femur size 3, Tibia size 4, Patella size 29 3-peg oval button, with a 9 mm polyethylene insert.   PREOPERATIVE INDICATIONS:  Corey Garcia is a 73 y.o. year old male with end stage bone on bone degenerative arthritis of the knee who failed conservative treatment, including injections, antiinflammatories, activity modification, and assistive devices, and had significant impairment of their activities of daily living, and elected for Total Knee Arthroplasty.   The risks, benefits, and alternatives were discussed at length including but not limited to the risks of infection, bleeding, nerve injury, stiffness, blood clots, the need for revision surgery, cardiopulmonary complications, among others, and they were willing to proceed.   OPERATIVE DESCRIPTION:  The patient was brought to the operative room and placed in a supine position.  General anesthesia was administered.  IV antibiotics were given.  The lower extremity was prepped and draped in the usual sterile fashion.  Time out was performed.  The leg was elevated and exsanguinated and the tourniquet was inflated.  Anterior approach was performed.  The patella was everted and osteophytes were removed.  The anterior horn of the medial and lateral meniscus was removed.   The distal femur was opened with the drill and the intramedullary distal femoral cutting jig was utilized, set at 5 degrees resecting 8 mm off the distal  femur.  Care was taken to protect the collateral ligaments.  The distal femoral sizing jig was applied, taking care to avoid notching.  Then the 4-in-1 cutting jig was applied and the anterior and posterior femur was cut, along with the chamfer cuts.  All posterior osteophytes were removed.  The flexion gap was then measured and was symmetric with the extension gap.  Then the extramedullary tibial cutting jig was utilized making the appropriate cut using the anterior tibial crest as a reference building in appropriate posterior slope.  Care was taken during the cut to protect the medial and collateral ligaments.  The proximal tibia was removed along with the posterior horns of the menisci.  The PCL was sacrificed.    The extensor gap was measured and was approximately 79mm.    I completed the distal femoral preparation using the appropriate jig to prepare the box.  The patella was then measured, and cut with the saw.    The proximal tibia sized and prepared accordingly with the reamer and the punch, and then all components were trialed with the above sized poly insert.  The knee was found to have excellent balance and full motion.    The above named components were then impacted into place and Poly tibial piece and patella were inserted.  I was very happy with his stability and ROM  I performed a periarticular injection with marcaine and toradol  The knee was easily taken through a range of motion and the patella tracked well and the knee irrigated copiously and the parapatellar and subcutaneous tissue closed with vicryl, and monocryl with steri strips for the skin.  The incision was dressed with sterile gauze and the tourniquet released and the patient was awakened and returned to the PACU in stable and satisfactory condition.  There were no complications.  Total tourniquet time was roughly 60 minutes.   POSTOPERATIVE PLAN: post op Abx, DVT px: SCD's, TED's, Early ambulation and chemical  px

## 2018-11-08 NOTE — Progress Notes (Signed)
Assisted Dr. Rose with left, ultrasound guided, adductor canal block. Side rails up, monitors on throughout procedure. See vital signs in flow sheet. Tolerated Procedure well.  

## 2018-11-08 NOTE — Discharge Instructions (Signed)
You may bear weight as tolerated. °Keep your dressing on and dry until follow up. °Take medicine to prevent blood clots as directed. °Take pain medicine as needed with the goal of transitioning to over the counter medicines.  °If needed, you may increase breakthrough pain medication (oxycodone) for the first few days post op - up to 2 tablets every 4 hours.  Stop as this medication as soon as you are able. ° °INSTRUCTIONS AFTER JOINT REPLACEMENT  ° °o Remove items at home which could result in a fall. This includes throw rugs or furniture in walking pathways °o ICE to the affected joint every three hours while awake for 30 minutes at a time, for at least the first 3-5 days, and then as needed for pain and swelling.  Continue to use ice for pain and swelling. You may notice swelling that will progress down to the foot and ankle.  This is normal after surgery.  Elevate your leg when you are not up walking on it.   °o Continue to use the breathing machine you got in the hospital (incentive spirometer) which will help keep your temperature down.  It is common for your temperature to cycle up and down following surgery, especially at night when you are not up moving around and exerting yourself.  The breathing machine keeps your lungs expanded and your temperature down. ° ° °DIET:  As you were doing prior to hospitalization, we recommend a well-balanced diet. ° °DRESSING / WOUND CARE / SHOWERING ° °You may shower 3 days after surgery, but keep the wounds dry during showering.  You may use an occlusive plastic wrap (Press'n Seal for example) with blue painter's tape at edges, NO SOAKING/SUBMERGING IN THE BATHTUB.  If the bandage gets wet, change with a clean dry gauze.  If the incision gets wet, pat the wound dry with a clean towel. ° °ACTIVITY ° °o Increase activity slowly as tolerated, but follow the weight bearing instructions below.   °o No driving for 6 weeks or until further direction given by your physician.  You  cannot drive while taking narcotics.  °o No lifting or carrying greater than 10 lbs. until further directed by your surgeon. °o Avoid periods of inactivity such as sitting longer than an hour when not asleep. This helps prevent blood clots.  °o You may return to work once you are authorized by your doctor.  ° ° ° °WEIGHT BEARING  ° °Weight bearing as tolerated with assist device (walker, cane, etc) as directed, use it as long as suggested by your surgeon or therapist, typically at least 4-6 weeks. ° ° °EXERCISES ° °Results after joint replacement surgery are often greatly improved when you follow the exercise, range of motion and muscle strengthening exercises prescribed by your doctor. Safety measures are also important to protect the joint from further injury. Any time any of these exercises cause you to have increased pain or swelling, decrease what you are doing until you are comfortable again and then slowly increase them. If you have problems or questions, call your caregiver or physical therapist for advice.  ° °Rehabilitation is important following a joint replacement. After just a few days of immobilization, the muscles of the leg can become weakened and shrink (atrophy).  These exercises are designed to build up the tone and strength of the thigh and leg muscles and to improve motion. Often times heat used for twenty to thirty minutes before working out will loosen up your tissues and help   with improving the range of motion but do not use heat for the first two weeks following surgery (sometimes heat can increase post-operative swelling).   These exercises can be done on a training (exercise) mat, on the floor, on a table or on a bed. Use whatever works the best and is most comfortable for you.    Use music or television while you are exercising so that the exercises are a pleasant break in your day. This will make your life better with the exercises acting as a break in your routine that you can look  forward to.   Perform all exercises about fifteen times, three times per day or as directed.  You should exercise both the operative leg and the other leg as well.  Exercises include:    Quad Sets - Tighten up the muscle on the front of the thigh (Quad) and hold for 5-10 seconds.    Straight Leg Raises - With your knee straight (if you were given a brace, keep it on), lift the leg to 60 degrees, hold for 3 seconds, and slowly lower the leg.  Perform this exercise against resistance later as your leg gets stronger.   Leg Slides: Lying on your back, slowly slide your foot toward your buttocks, bending your knee up off the floor (only go as far as is comfortable). Then slowly slide your foot back down until your leg is flat on the floor again.   Angel Wings: Lying on your back spread your legs to the side as far apart as you can without causing discomfort.   Hamstring Strength:  Lying on your back, push your heel against the floor with your leg straight by tightening up the muscles of your buttocks.  Repeat, but this time bend your knee to a comfortable angle, and push your heel against the floor.  You may put a pillow under the heel to make it more comfortable if necessary.   A rehabilitation program following joint replacement surgery can speed recovery and prevent re-injury in the future due to weakened muscles. Contact your doctor or a physical therapist for more information on knee rehabilitation.    CONSTIPATION  Constipation is defined medically as fewer than three stools per week and severe constipation as less than one stool per week.  Even if you have a regular bowel pattern at home, your normal regimen is likely to be disrupted due to multiple reasons following surgery.  Combination of anesthesia, postoperative narcotics, change in appetite and fluid intake all can affect your bowels.   YOU MUST use at least one of the following options; they are listed in order of increasing strength  to get the job done.  They are all available over the counter, and you may need to use some, POSSIBLY even all of these options:    Drink plenty of fluids (prune juice may be helpful) and high fiber foods Colace 100 mg by mouth twice a day  Senokot for constipation as directed and as needed Dulcolax (bisacodyl), take with full glass of water  Miralax (polyethylene glycol) once or twice a day as needed.  If you have tried all these things and are unable to have a bowel movement in the first 3-4 days after surgery call either your surgeon or your primary doctor.    If you experience loose stools or diarrhea, hold the medications until you stool forms back up.  If your symptoms do not get better within 1 week or if they get  worse, check with your doctor.  If you experience "the worst abdominal pain ever" or develop nausea or vomiting, please contact the office immediately for further recommendations for treatment.   ITCHING:  If you experience itching with your medications, try taking only a single pain pill, or even half a pain pill at a time.  You can also use Benadryl over the counter for itching or also to help with sleep.   TED HOSE STOCKINGS:  Use stockings on both legs until for at least 2 weeks or as directed by physician office. They may be removed at night for sleeping.  MEDICATIONS:  See your medication summary on the After Visit Summary that nursing will review with you.  You may have some home medications which will be placed on hold until you complete the course of blood thinner medication.  It is important for you to complete the blood thinner medication as prescribed.  PRECAUTIONS:  If you experience chest pain or shortness of breath - call 911 immediately for transfer to the hospital emergency department.   If you develop a fever greater that 101 F, purulent drainage from wound, increased redness or drainage from wound, foul odor from the wound/dressing, or calf pain - CONTACT  YOUR SURGEON.                                                   FOLLOW-UP APPOINTMENTS:  If you do not already have a post-op appointment, please call the office for an appointment to be seen by your surgeon.  Guidelines for how soon to be seen are listed in your After Visit Summary, but are typically between 1-4 weeks after surgery.  OTHER INSTRUCTIONS:     MAKE SURE YOU:   Understand these instructions.   Get help right away if you are not doing well or get worse.    Thank you for letting us be a part of your medical care team.  It is a privilege we respect greatly.  We hope these instructions will help you stay on track for a fast and full recovery!

## 2018-11-08 NOTE — Transfer of Care (Signed)
Immediate Anesthesia Transfer of Care Note  Patient: Corey Garcia  Procedure(s) Performed: TOTAL KNEE ARTHROPLASTY (Left Knee)  Patient Location: PACU  Anesthesia Type:MAC and Spinal  Level of Consciousness: drowsy, patient cooperative and responds to stimulation  Airway & Oxygen Therapy: Patient Spontanous Breathing and Patient connected to face mask oxygen  Post-op Assessment: Report given to RN and Post -op Vital signs reviewed and stable  Post vital signs: Reviewed and stable  Last Vitals:  Vitals Value Taken Time  BP 126/67 11/08/2018 12:25 PM  Temp    Pulse 67 11/08/2018 12:28 PM  Resp 10 11/08/2018 12:28 PM  SpO2 100 % 11/08/2018 12:28 PM  Vitals shown include unvalidated device data.  Last Pain:  Vitals:   11/08/18 0745  TempSrc: Oral      Patients Stated Pain Goal: 4 (76/16/07 3710)  Complications: No apparent anesthesia complications

## 2018-11-08 NOTE — Anesthesia Postprocedure Evaluation (Signed)
Anesthesia Post Note  Patient: Corey Garcia  Procedure(s) Performed: TOTAL KNEE ARTHROPLASTY (Left Knee)     Patient location during evaluation: PACU Anesthesia Type: Spinal Level of consciousness: oriented and awake and alert Pain management: pain level controlled Vital Signs Assessment: post-procedure vital signs reviewed and stable Respiratory status: spontaneous breathing, respiratory function stable and patient connected to nasal cannula oxygen Cardiovascular status: blood pressure returned to baseline and stable Postop Assessment: no headache, no backache and no apparent nausea or vomiting Anesthetic complications: no    Last Vitals:  Vitals:   11/08/18 1315 11/08/18 1338  BP: (!) 152/74 (!) 141/80  Pulse: (!) 53 60  Resp: (!) 9 12  Temp:  36.5 C  SpO2: 100% 100%    Last Pain:  Vitals:   11/08/18 1338  TempSrc: Oral  PainSc: 0-No pain                 Chanika Byland S

## 2018-11-08 NOTE — Anesthesia Procedure Notes (Signed)
Anesthesia Procedure Image    

## 2018-11-09 NOTE — Discharge Summary (Signed)
Discharge Summary  Patient ID: Corey Garcia MRN: [id number] DOB/AGE: 72-Feb-1947 72 y.o.  Admit date: 11/08/2018 Discharge date: 11/09/2018  Admission Diagnoses:  Primary osteoarthritis of left knee  Discharge Diagnoses:  Principal Problem:   Primary osteoarthritis of left knee Active Problems:   CAD (coronary artery disease)   Hyperlipidemia   Essential hypertension   Nonrheumatic aortic valve stenosis   Macrocytic anemia   AAA (abdominal aortic aneurysm) (HCC)   BPH (benign prostatic hyperplasia)   Seborrheic eczema of scalp   Primary localized osteoarthritis of knee   Past Medical History:  Diagnosis Date  . Alcoholism ([place]) 03/15/2014  . Anemia   . Arthritis    all over, some ra  . Bradycardia by electrocardiogram    BB decreased 11/12/11  . CAD (coronary artery disease)    last cath=01/2008, PCI  . CHF (congestive heart failure) ([place])   . Dyslipidemia   . ED (erectile dysfunction)    viagra helps  . GERD (gastroesophageal reflux disease)   . HTN (hypertension)   . Iron overload 03/15/2014  . Liver disease due to alcohol ([place]) 03/15/2014   pt .denies at preop  . Murmur, cardiac    faint early systolic grade 1/6 aortic ejection murmur  . Myocardial infarction ([place])   . Peripheral vascular disease ([place])   . Rib pain 04/16/2014  . Skin cancer    basal cell ca X 4    Surgeries: Procedure(s): TOTAL KNEE ARTHROPLASTY on 11/08/2018   Consultants (if any):   Discharged Condition: Improved  Hospital Course: Keyshun Elpers is an 73 y.o. male who was admitted 11/08/2018 with a diagnosis of Primary osteoarthritis of left knee and went to the operating room on 11/08/2018 and underwent the above named procedures.    He was given perioperative antibiotics:  Anti-infectives (From admission, onward)   Start     Dose/Rate Route Frequency Ordered Stop   11/08/18 1700  ceFAZolin (ANCEF) IVPB 2g/100 mL premix     2 g 200 mL/hr over 30 Minutes Intravenous Every 6 hours  11/08/18 1336 11/08/18 2321   11/08/18 0745  ceFAZolin (ANCEF) IVPB 2g/100 mL premix     2 g 200 mL/hr over 30 Minutes Intravenous On call to O.R. 11/08/18 0730 11/08/18 1110    .  He was given sequential compression devices, early ambulation, and aspirin for DVT prophylaxis.  He benefited maximally from the hospital stay and there were no complications.    Recent vital signs:  Vitals:   11/09/18 0123 11/09/18 0459  BP: 126/64 131/66  Pulse: 74 75  Resp: 17   Temp: 99.1 F (37.3 C) 99 F (37.2 C)  SpO2: 100% 100%    Recent laboratory studies:  Lab Results  Component Value Date   HGB 12.4 (L) 11/01/2018   HGB 12.4 (L) 10/06/2018   HGB 10.5 (L) 05/12/2018   Lab Results  Component Value Date   WBC 5.0 11/01/2018   PLT 193 11/01/2018   Lab Results  Component Value Date   INR 1.25 01/13/2018   Lab Results  Component Value Date   NA 135 11/01/2018   K 4.0 11/01/2018   CL 101 11/01/2018   CO2 26 11/01/2018   BUN 19 11/01/2018   CREATININE 0.97 11/01/2018   GLUCOSE 88 11/01/2018    Discharge Medications:   Allergies as of 11/09/2018      Reactions   Lisinopril Other (See Comments)   PHOTOSENSITIVITY ON ARMS   Minocin [minocycline Hcl]  Flank pain   Sulfa Antibiotics    Thiazide-type Diuretics Other (See Comments)   "Lowered my blood pressure"   Codeine Nausea Only      Medication List    STOP taking these medications   acetaminophen 650 MG CR tablet Commonly known as:  TYLENOL Replaced by:  acetaminophen 500 MG tablet   aspirin 81 MG tablet Replaced by:  aspirin EC 81 MG tablet   diclofenac sodium 1 % Gel Commonly known as:  VOLTAREN     TAKE these medications   acetaminophen 500 MG tablet Commonly known as:  TYLENOL Take 2 tablets (1,000 mg total) by mouth every 8 (eight) hours for 10 days. For Pain. Replaces:  acetaminophen 650 MG CR tablet   amLODipine 2.5 MG tablet Commonly known as:  NORVASC Take 1 tablet (2.5 mg total) by mouth  daily.   aspirin EC 81 MG tablet Take 1 tablet (81 mg total) by mouth 2 (two) times daily. For DVT prophylaxis for 30 days after surgery. Replaces:  aspirin 81 MG tablet   atorvastatin 20 MG tablet Commonly known as:  LIPITOR Take 20 mg by mouth daily.   bisoprolol 10 MG tablet Commonly known as:  ZEBETA Take 15 mg by mouth daily.   cholecalciferol 1000 units tablet Commonly known as:  VITAMIN D Take 1,000 Units by mouth daily.   docusate sodium 100 MG capsule Commonly known as:  Colace Take 1 capsule (100 mg total) by mouth 2 (two) times daily. To prevent constipation while taking pain medication.   gabapentin 300 MG capsule Commonly known as:  Neurontin Take 1 capsule (300 mg total) by mouth 3 (three) times daily for 14 days. For 2 weeks post op for pain.   loratadine 10 MG tablet Commonly known as:  CLARITIN Take 10 mg by mouth daily.   methocarbamol 500 MG tablet Commonly known as:  Robaxin Take 1 tablet (500 mg total) by mouth every 8 (eight) hours as needed for muscle spasms.   ondansetron 4 MG tablet Commonly known as:  Zofran Take 1 tablet (4 mg total) by mouth every 8 (eight) hours as needed for nausea or vomiting.   ONE DAILY MULTIVITAMIN MEN PO Take 1 tablet by mouth daily.   oxyCODONE 5 MG immediate release tablet Commonly known as:  Roxicodone Take 1 tablet (5 mg total) by mouth every 4 (four) hours as needed for up to 30 days for breakthrough pain.   PROBIOTIC PO Take 1 tablet by mouth daily.   Procto-Med HC 2.5 % rectal cream Generic drug:  hydrocortisone Place 1 application rectally as needed for hemorrhoids or itching.   Protonix 40 MG tablet Generic drug:  pantoprazole Take 40 mg by mouth daily.   tacrolimus 0.03 % ointment Commonly known as:  PROTOPIC Apply 1 application topically daily as needed (dermatitis on face).   tamsulosin 0.4 MG Caps capsule Commonly known as:  FLOMAX Take 0.4 mg by mouth daily.       Diagnostic Studies:  Dg Knee Left Port  Result Date: 11/08/2018 CLINICAL DATA:  Status post left total knee replacement. EXAM: PORTABLE LEFT KNEE - 1-2 VIEW COMPARISON:  None. FINDINGS: The femoral and tibial components appear to be well situated. Expected postoperative changes are noted in the soft tissues anteriorly. Vascular calcifications are noted. IMPRESSION: Status post left total knee arthroplasty. Electronically Signed   By: Marijo Conception, M.D.   On: 11/08/2018 13:28    Disposition: Discharge disposition: 01-Home or Self Care  Discharge Instructions    Discharge patient   Complete by:  As directed    Discharge disposition:  01-Home or Self Care   Discharge patient date:  11/09/2018      Follow-up Information    Renette Butters, MD.   Specialty:  Orthopedic Surgery Contact information: [address] [place] [address] [phone]            Signed: Prudencio Burly III PA-C 11/09/2018, 7:31 AM

## 2018-11-09 NOTE — Progress Notes (Signed)
Subjective: Patient reports pain as moderate.  Tolerating diet.  Foley just removed.  No CP, SOB.  Walked hall with therapy.  Objective:   VITALS:   Vitals:   11/08/18 1706 11/08/18 2128 11/09/18 0123 11/09/18 0459  BP: 139/86 (!) 116/59 126/64 131/66  Pulse: 62 64 74 75  Resp: 16 16 17    Temp: 97.6 F (36.4 C) 98.1 F (36.7 C) 99.1 F (37.3 C) 99 F (37.2 C)  TempSrc: Oral Oral Oral Oral  SpO2: 100% 100% 100% 100%  Weight:      Height:       CBC Latest Ref Rng & Units 11/01/2018 10/06/2018 05/12/2018  WBC 4.0 - 10.5 K/uL 5.0 5.5 4.4  Hemoglobin 13.0 - 17.0 g/dL 12.4(L) 12.4(L) 10.5(L)  Hematocrit 39.0 - 52.0 % 39.4 38.1(L) 30.7(L)  Platelets 150 - 400 K/uL 193 160 288   BMP Latest Ref Rng & Units 11/01/2018 05/12/2018 05/11/2018  Glucose 70 - 99 mg/dL 88 79 64(L)  BUN 8 - 23 mg/dL 19 6(L) 10  Creatinine 0.61 - 1.24 mg/dL 0.97 0.78 1.05  Sodium 135 - 145 mmol/L 135 135 128(L)  Potassium 3.5 - 5.1 mmol/L 4.0 3.3(L) 3.5  Chloride 98 - 111 mmol/L 101 101 95(L)  CO2 22 - 32 mmol/L 26 21(L) 17(L)  Calcium 8.9 - 10.3 mg/dL 9.3 9.0 8.7(L)   Intake/Output      03/10 0701 - 03/11 0700 03/11 0701 - 03/12 0700   P.O. 300    I.V. (mL/kg) 3274 (48.8)    IV Piggyback 300.1    Total Intake(mL/kg) 3874 (57.7)    Urine (mL/kg/hr) 2275    Blood 25    Total Output 2300    Net +1574            Physical Exam: General: NAD.  Upright in bed.  Asleep on arrival.  Awakes calm, conversant. Resp: No increased wob Cardio: regular rate and rhythm ABD soft Neurologically intact MSK LLE: Neurovascularly intact Sensation intact distally Feet warm Dorsiflexion/Plantar flexion intact Incision: dressing C/D/I   Assessment: 1 Day Post-Op  S/P Procedure(s) (LRB): TOTAL KNEE ARTHROPLASTY (Left) by Dr. Ernesta Amble. Percell Miller on 11/08/2018  Principal Problem:   Primary osteoarthritis of left knee Active Problems:   CAD (coronary artery disease)   Hyperlipidemia   Essential hypertension  Nonrheumatic aortic valve stenosis   Macrocytic anemia   AAA (abdominal aortic aneurysm) (HCC)   BPH (benign prostatic hyperplasia)   Seborrheic eczema of scalp   Primary localized osteoarthritis of knee   Primary osteoarthritis, status post left total knee arthroplasty Doing well postop day 1 Eating, drinking. Foley just removed.  History of BPH. Pain controlled Good early mobilization  Plan: Up with therapy Incentive Spirometry Elevate and Apply ice CPM, bone foam  Weight Bearing: Weight Bearing as Tolerated (WBAT) LLE Dressings: Maintain Mepilex.  Please apply thigh high TED hose to operative leg prior to discharge. VTE prophylaxis: Aspirin, SCDs, ambulation Dispo: Home today after therapy.  Anticipated LOS anticipated to be less than than 2 midnights, but approved for inpatient surgery: - Age 72 and older with one or more of the following:             - Obesity             - Expected need for hospital services (PT, OT, Nursing) required for safe   discharge             - Anticipated need for postoperative skilled nursing care  or inpatient rehab             - Active co-morbidities: Coronary Artery Disease   Prudencio Burly III, PA-C 11/09/2018, 7:27 AM

## 2018-11-09 NOTE — Progress Notes (Addendum)
Physical Therapy Treatment Patient Details Name: Corey Garcia MRN: [id number] DOB: 1946-02-22 Today's Date: 11/09/2018    History of Present Illness 73 yo male s/p L TKR on 11/08/18. PMH includes alcoholism, anemia, OA and RA, CAD, CHF, HTN, liver disease, MI with coronary angioplasty and stenting, PVD, AAA s/p EVAR 2019.    PT Comments    Pt and spouse educated on KI use and application.  Pt ambulated again in hallway and practiced safe stair technique.  Spouse observed session.  Pt eager for d/c home today.  Spouse reports HEP handout provided yesterday and pt to f/u with HHPT.     Follow Up Recommendations  Follow surgeon's recommendation for DC plan and follow-up therapies;Supervision for mobility/OOB     Equipment Recommendations  3in1 (PT)    Recommendations for Other Services       Precautions / Restrictions Precautions Precautions: Fall;Knee Required Braces or Orthoses: Knee Immobilizer - Left Restrictions Other Position/Activity Restrictions: WBAT     Mobility  Bed Mobility Overal bed mobility: Needs Assistance Bed Mobility: Supine to Sit     Supine to sit: Min assist     General bed mobility comments: pt up in recliner on arrival  Transfers Overall transfer level: Needs assistance Equipment used: Rolling walker (2 wheeled) Transfers: Sit to/from Stand Sit to Stand: Min guard Stand pivot transfers: Min guard       General transfer comment: Min guard for safety, verbal cuing for hand placement.   Ambulation/Gait Ambulation/Gait assistance: Min guard Gait Distance (Feet): 70 Feet Assistive device: Rolling walker (2 wheeled) Gait Pattern/deviations: Step-to pattern;Antalgic;Decreased stance time - left     General Gait Details: Min guard for safety. Verbal cuing for sequencing, RW positioning, posture, slow pace however improved a little from this morning   Stairs Stairs: Yes Stairs assistance: Min guard Stair Management: Step to  pattern;Forwards;Two rails Number of Stairs: 3 General stair comments: verbal cues for sequence and safety, spouse present and observed, son will also be available to assist pt with steps upon d/c for safety   Wheelchair Mobility    Modified Rankin (Stroke Patients Only)       Balance Overall balance assessment: Mild deficits observed, not formally tested                                          Cognition Arousal/Alertness: Awake/alert Behavior During Therapy: WFL for tasks assessed/performed Overall Cognitive Status: Within Functional Limits for tasks assessed                                        Exercises     General Comments        Pertinent Vitals/Pain Pain Assessment: 0-10 Pain Score: 4  Pain Location: L knee  Pain Descriptors / Indicators: Aching;Sore Pain Intervention(s): Premedicated before session;Monitored during session;Repositioned    Home Living Family/patient expects to be discharged to:: Private residence Living Arrangements: Spouse/significant other;Other relatives(grandson ) Available Help at Discharge: Family;Available PRN/intermittently Type of Home: House Home Access: Stairs to enter Entrance Stairs-Rails: Left;Right Home Layout: One level Home Equipment: Environmental consultant - 2 wheels;Cane - single point;Crutches      Prior Function Level of Independence: Independent with assistive device(s)      Comments: uses cane PRN PTA   PT Goals (current goals can  now be found in the care plan section) Acute Rehab PT Goals Patient Stated Goal: home this day Progress towards PT goals: Progressing toward goals    Frequency    7X/week      PT Plan Current plan remains appropriate    Co-evaluation              AM-PAC PT "6 Clicks" Mobility   Outcome Measure  Help needed turning from your back to your side while in a flat bed without using bedrails?: A Little Help needed moving from lying on your back to  sitting on the side of a flat bed without using bedrails?: A Little Help needed moving to and from a bed to a chair (including a wheelchair)?: A Little Help needed standing up from a chair using your arms (e.g., wheelchair or bedside chair)?: A Little Help needed to walk in hospital room?: A Little Help needed climbing 3-5 steps with a railing? : A Little 6 Click Score: 18    End of Session Equipment Utilized During Treatment: Gait belt;Left knee immobilizer Activity Tolerance: Patient tolerated treatment well Patient left: with call bell/phone within reach;in chair;with family/visitor present Nurse Communication: Mobility status PT Visit Diagnosis: Other abnormalities of gait and mobility (R26.89)     Time: 0321-2248 PT Time Calculation (min) (ACUTE ONLY): 24 min  Charges:  $Gait Training: 23-37 mins                      Carmelia Bake, PT, DPT Acute Rehabilitation Services Office: [phone] Pager: [place] 11/09/2018, 2:59 PM

## 2018-11-09 NOTE — Progress Notes (Signed)
Physical Therapy Treatment Patient Details Name: Corey Garcia MRN: [id number] DOB: [birth year] Today's Date: 11/09/2018    History of Present Illness 73 yo male s/p L TKR on 11/08/18. PMH includes alcoholism, anemia, OA and RA, CAD, CHF, HTN, liver disease, MI with coronary angioplasty and stenting, PVD, AAA s/p EVAR 2019.    PT Comments    Pt ambulated in hallway and performed LE exercises.  Pt progressing slowly.  Will return this afternoon to practice steps prior to d/c.   Follow Up Recommendations  Follow surgeon's recommendation for DC plan and follow-up therapies;Supervision for mobility/OOB     Equipment Recommendations  3in1 (PT)    Recommendations for Other Services       Precautions / Restrictions Precautions Precautions: Fall;Knee Required Braces or Orthoses: Knee Immobilizer - Left Restrictions Other Position/Activity Restrictions: WBAT     Mobility  Bed Mobility               General bed mobility comments: pt up in recliner on arrival  Transfers Overall transfer level: Needs assistance Equipment used: Rolling walker (2 wheeled) Transfers: Sit to/from Stand Sit to Stand: Min guard         General transfer comment: Min guard for safety, verbal cuing for hand placement.   Ambulation/Gait Ambulation/Gait assistance: Min guard Gait Distance (Feet): 70 Feet Assistive device: Rolling walker (2 wheeled) Gait Pattern/deviations: Step-to pattern;Antalgic;Decreased stance time - left     General Gait Details: Min guard for safety. Verbal cuing for sequencing, RW positioning, posture, very slow pace   Stairs             Wheelchair Mobility    Modified Rankin (Stroke Patients Only)       Balance                                            Cognition Arousal/Alertness: Awake/alert Behavior During Therapy: WFL for tasks assessed/performed Overall Cognitive Status: Within Functional Limits for tasks assessed                                         Exercises Total Joint Exercises Ankle Circles/Pumps: AROM;10 reps;Both Quad Sets: AROM;10 reps;Left Short Arc Quad: AAROM;10 reps;Left Heel Slides: AAROM;10 reps;Left Hip ABduction/ADduction: AAROM;10 reps;Left Straight Leg Raises: Left;AAROM;10 reps Goniometric ROM: L knee AAROM approx -5-50*    General Comments        Pertinent Vitals/Pain Pain Assessment: 0-10 Pain Score: 6  Pain Location: L knee  Pain Descriptors / Indicators: Aching;Dull;Sore Pain Intervention(s): Premedicated before session;Monitored during session;Repositioned;Limited activity within patient's tolerance    Home Living                      Prior Function            PT Goals (current goals can now be found in the care plan section) Progress towards PT goals: Progressing toward goals    Frequency    7X/week      PT Plan Current plan remains appropriate    Co-evaluation              AM-PAC PT "6 Clicks" Mobility   Outcome Measure  Help needed turning from your back to your side while in a flat bed without using bedrails?: A  Little Help needed moving from lying on your back to sitting on the side of a flat bed without using bedrails?: A Little Help needed moving to and from a bed to a chair (including a wheelchair)?: A Little Help needed standing up from a chair using your arms (e.g., wheelchair or bedside chair)?: A Little Help needed to walk in hospital room?: A Little Help needed climbing 3-5 steps with a railing? : A Lot 6 Click Score: 17    End of Session Equipment Utilized During Treatment: Gait belt;Left knee immobilizer Activity Tolerance: Patient tolerated treatment well;Patient limited by pain Patient left: with call bell/phone within reach;in chair;with family/visitor present   PT Visit Diagnosis: Other abnormalities of gait and mobility (R26.89)     Time: 1308-6578 PT Time Calculation (min) (ACUTE ONLY): 24  min  Charges:  $Gait Training: 8-22 mins $Therapeutic Exercise: 8-22 mins                     Carmelia Bake, PT, DPT Acute Rehabilitation Services Office: [phone] Pager: [phone]  Trena Platt 11/09/2018, 12:13 PM

## 2018-11-09 NOTE — Progress Notes (Signed)
Patient discharged to home w/ wife. Given all belongings, instructions, prescriptions, equipment. Escorted to pov via w/c.

## 2018-11-09 NOTE — Plan of Care (Signed)
  Problem: Clinical Measurements: Goal: Ability to maintain clinical measurements within normal limits will improve Outcome: Progressing   Problem: Safety: Goal: Ability to remain free from injury will improve Outcome: Progressing   Problem: Skin Integrity: Goal: Risk for impaired skin integrity will decrease Outcome: Progressing   Problem: Pain Managment: Goal: General experience of comfort will improve Outcome: Progressing

## 2018-11-09 NOTE — Evaluation (Signed)
Occupational Therapy Evaluation Patient Details Name: Corey Garcia MRN: [id number] DOB: 1946-03-30 Today's Date: 11/09/2018    History of Present Illness 73 yo male s/p L TKR on 11/08/18. PMH includes alcoholism, anemia, OA and RA, CAD, CHF, HTN, liver disease, MI with coronary angioplasty and stenting, PVD, AAA s/p EVAR 2019.   Clinical Impression   OT education complete.  Wife will A as needed    Follow Up Recommendations  No OT follow up;Supervision/Assistance - 24 hour    Equipment Recommendations  3 in 1 bedside commode    Recommendations for Other Services       Precautions / Restrictions Precautions Precautions: Fall;Knee Required Braces or Orthoses: Knee Immobilizer - Left Restrictions Other Position/Activity Restrictions: WBAT       Mobility Bed Mobility Overal bed mobility: Needs Assistance Bed Mobility: Supine to Sit     Supine to sit: Min assist     General bed mobility comments: pt up in recliner on arrival  Transfers Overall transfer level: Needs assistance Equipment used: Rolling walker (2 wheeled) Transfers: Sit to/from [employer] Sit to Stand: Min guard Stand pivot transfers: Min guard       General transfer comment: Min guard for safety, verbal cuing for hand placement.     Balance Overall balance assessment: Mild deficits observed, not formally tested                                         ADL either performed or assessed with clinical judgement   ADL Overall ADL's : Needs assistance/impaired Eating/Feeding: Set up;Sitting   Grooming: Standing;Supervision/safety   Upper Body Bathing: Set up;Sitting   Lower Body Bathing: Minimal assistance;Sit to/from stand;Cueing for sequencing;Cueing for safety   Upper Body Dressing : Set up;Sitting   Lower Body Dressing: Minimal assistance;Sit to/from stand;With caregiver independent assisting;Cueing for safety   Toilet Transfer: Min guard;Comfort height  toilet;Ambulation;RW   Toileting- Clothing Manipulation and Hygiene: Minimal assistance;Sit to/from stand;Cueing for sequencing;Cueing for safety   Tub/ Shower Transfer: Walk-in shower;Minimal assistance;Rolling walker   Functional mobility during ADLs: Minimal assistance;Rolling walker;Caregiver able to provide necessary level of assistance;Cueing for safety;Cueing for sequencing General ADL Comments: wife will A as needed     Vision Patient Visual Report: No change from baseline              Pertinent Vitals/Pain Pain Assessment: 0-10 Pain Score: 3  Pain Location: L knee  Pain Descriptors / Indicators: Aching;Dull;Sore Pain Intervention(s): Limited activity within patient's tolerance;Repositioned;Ice applied     Hand Dominance Right   Extremity/Trunk Assessment Upper Extremity Assessment Upper Extremity Assessment: Overall WFL for tasks assessed           Communication Communication Communication: No difficulties   Cognition Arousal/Alertness: Awake/alert Behavior During Therapy: WFL for tasks assessed/performed Overall Cognitive Status: Within Functional Limits for tasks assessed                                                Home Living Family/patient expects to be discharged to:: Private residence Living Arrangements: Spouse/significant other;Other relatives(grandson ) Available Help at Discharge: Family;Available PRN/intermittently Type of Home: House Home Access: Stairs to enter [employer] of Steps: 2 Entrance Stairs-Rails: Left;Right Home Layout: One level     Bathroom  Shower/Tub: Walk-in shower;Tub/shower unit   Bathroom Toilet: Standard     Home Equipment: Environmental consultant - 2 wheels;Cane - single point;Crutches          Prior Functioning/Environment Level of Independence: Independent with assistive device(s)        Comments: uses cane PRN PTA                 OT Goals(Current goals can be found in the care  plan section) Acute Rehab OT Goals Patient Stated Goal: home this day OT Goal Formulation: With patient  OT Frequency:      AM-PAC OT "6 Clicks" Daily Activity     Outcome Measure Help from another person eating meals?: None Help from another person taking care of personal grooming?: None Help from another person toileting, which includes using toliet, bedpan, or urinal?: A Little Help from another person bathing (including washing, rinsing, drying)?: A Little Help from another person to put on and taking off regular upper body clothing?: None Help from another person to put on and taking off regular lower body clothing?: A Little 6 Click Score: 21   End of Session Equipment Utilized During Treatment: Gait belt;Rolling walker Nurse Communication: Mobility status  Activity Tolerance: Patient tolerated treatment well Patient left: in chair;with call bell/phone within reach                   Time: 1050-1118 OT Time Calculation (min): 28 min Charges:  OT General Charges $OT Visit: 1 Visit OT Evaluation $OT Eval Low Complexity: 1 Low OT Treatments $Self Care/Home Management : 8-22 mins  Kari Baars, OT Acute Rehabilitation Services [phone] Office- [place]     Taraya Steward, Edwena Felty D 11/09/2018, 1:32 PM

## 2018-11-10 ENCOUNTER — Encounter (HOSPITAL_COMMUNITY): Payer: Self-pay | Admitting: Orthopedic Surgery

## 2018-11-10 DIAGNOSIS — Z7982 Long term (current) use of aspirin: Secondary | ICD-10-CM | POA: Diagnosis not present

## 2018-11-10 DIAGNOSIS — Z955 Presence of coronary angioplasty implant and graft: Secondary | ICD-10-CM | POA: Diagnosis not present

## 2018-11-10 DIAGNOSIS — I252 Old myocardial infarction: Secondary | ICD-10-CM | POA: Diagnosis not present

## 2018-11-10 DIAGNOSIS — Z85828 Personal history of other malignant neoplasm of skin: Secondary | ICD-10-CM | POA: Diagnosis not present

## 2018-11-10 DIAGNOSIS — Z9181 History of falling: Secondary | ICD-10-CM | POA: Diagnosis not present

## 2018-11-10 DIAGNOSIS — E785 Hyperlipidemia, unspecified: Secondary | ICD-10-CM | POA: Diagnosis not present

## 2018-11-10 DIAGNOSIS — N400 Enlarged prostate without lower urinary tract symptoms: Secondary | ICD-10-CM | POA: Diagnosis not present

## 2018-11-10 DIAGNOSIS — I714 Abdominal aortic aneurysm, without rupture: Secondary | ICD-10-CM | POA: Diagnosis not present

## 2018-11-10 DIAGNOSIS — I509 Heart failure, unspecified: Secondary | ICD-10-CM | POA: Diagnosis not present

## 2018-11-10 DIAGNOSIS — K769 Liver disease, unspecified: Secondary | ICD-10-CM | POA: Diagnosis not present

## 2018-11-10 DIAGNOSIS — K219 Gastro-esophageal reflux disease without esophagitis: Secondary | ICD-10-CM | POA: Diagnosis not present

## 2018-11-10 DIAGNOSIS — F1020 Alcohol dependence, uncomplicated: Secondary | ICD-10-CM | POA: Diagnosis not present

## 2018-11-10 DIAGNOSIS — I350 Nonrheumatic aortic (valve) stenosis: Secondary | ICD-10-CM | POA: Diagnosis not present

## 2018-11-10 DIAGNOSIS — Z471 Aftercare following joint replacement surgery: Secondary | ICD-10-CM | POA: Diagnosis not present

## 2018-11-10 DIAGNOSIS — Z87891 Personal history of nicotine dependence: Secondary | ICD-10-CM | POA: Diagnosis not present

## 2018-11-10 DIAGNOSIS — N529 Male erectile dysfunction, unspecified: Secondary | ICD-10-CM | POA: Diagnosis not present

## 2018-11-10 DIAGNOSIS — M150 Primary generalized (osteo)arthritis: Secondary | ICD-10-CM | POA: Diagnosis not present

## 2018-11-10 DIAGNOSIS — D539 Nutritional anemia, unspecified: Secondary | ICD-10-CM | POA: Diagnosis not present

## 2018-11-10 DIAGNOSIS — Z96652 Presence of left artificial knee joint: Secondary | ICD-10-CM | POA: Diagnosis not present

## 2018-11-10 DIAGNOSIS — L219 Seborrheic dermatitis, unspecified: Secondary | ICD-10-CM | POA: Diagnosis not present

## 2018-11-10 DIAGNOSIS — M1712 Unilateral primary osteoarthritis, left knee: Secondary | ICD-10-CM | POA: Diagnosis not present

## 2018-11-10 DIAGNOSIS — I25118 Atherosclerotic heart disease of native coronary artery with other forms of angina pectoris: Secondary | ICD-10-CM | POA: Diagnosis not present

## 2018-11-10 DIAGNOSIS — I110 Hypertensive heart disease with heart failure: Secondary | ICD-10-CM | POA: Diagnosis not present

## 2018-11-10 DIAGNOSIS — I739 Peripheral vascular disease, unspecified: Secondary | ICD-10-CM | POA: Diagnosis not present

## 2018-11-15 DIAGNOSIS — N400 Enlarged prostate without lower urinary tract symptoms: Secondary | ICD-10-CM | POA: Diagnosis not present

## 2018-11-15 DIAGNOSIS — I110 Hypertensive heart disease with heart failure: Secondary | ICD-10-CM | POA: Diagnosis not present

## 2018-11-15 DIAGNOSIS — E785 Hyperlipidemia, unspecified: Secondary | ICD-10-CM | POA: Diagnosis not present

## 2018-11-15 DIAGNOSIS — F1020 Alcohol dependence, uncomplicated: Secondary | ICD-10-CM | POA: Diagnosis not present

## 2018-11-15 DIAGNOSIS — Z9181 History of falling: Secondary | ICD-10-CM | POA: Diagnosis not present

## 2018-11-15 DIAGNOSIS — Z471 Aftercare following joint replacement surgery: Secondary | ICD-10-CM | POA: Diagnosis not present

## 2018-11-15 DIAGNOSIS — Z96652 Presence of left artificial knee joint: Secondary | ICD-10-CM | POA: Diagnosis not present

## 2018-11-15 DIAGNOSIS — I739 Peripheral vascular disease, unspecified: Secondary | ICD-10-CM | POA: Diagnosis not present

## 2018-11-15 DIAGNOSIS — K769 Liver disease, unspecified: Secondary | ICD-10-CM | POA: Diagnosis not present

## 2018-11-15 DIAGNOSIS — Z955 Presence of coronary angioplasty implant and graft: Secondary | ICD-10-CM | POA: Diagnosis not present

## 2018-11-15 DIAGNOSIS — L219 Seborrheic dermatitis, unspecified: Secondary | ICD-10-CM | POA: Diagnosis not present

## 2018-11-15 DIAGNOSIS — I714 Abdominal aortic aneurysm, without rupture: Secondary | ICD-10-CM | POA: Diagnosis not present

## 2018-11-15 DIAGNOSIS — K219 Gastro-esophageal reflux disease without esophagitis: Secondary | ICD-10-CM | POA: Diagnosis not present

## 2018-11-15 DIAGNOSIS — M150 Primary generalized (osteo)arthritis: Secondary | ICD-10-CM | POA: Diagnosis not present

## 2018-11-15 DIAGNOSIS — N529 Male erectile dysfunction, unspecified: Secondary | ICD-10-CM | POA: Diagnosis not present

## 2018-11-15 DIAGNOSIS — I350 Nonrheumatic aortic (valve) stenosis: Secondary | ICD-10-CM | POA: Diagnosis not present

## 2018-11-15 DIAGNOSIS — I252 Old myocardial infarction: Secondary | ICD-10-CM | POA: Diagnosis not present

## 2018-11-15 DIAGNOSIS — Z7982 Long term (current) use of aspirin: Secondary | ICD-10-CM | POA: Diagnosis not present

## 2018-11-15 DIAGNOSIS — Z87891 Personal history of nicotine dependence: Secondary | ICD-10-CM | POA: Diagnosis not present

## 2018-11-15 DIAGNOSIS — I509 Heart failure, unspecified: Secondary | ICD-10-CM | POA: Diagnosis not present

## 2018-11-15 DIAGNOSIS — I25118 Atherosclerotic heart disease of native coronary artery with other forms of angina pectoris: Secondary | ICD-10-CM | POA: Diagnosis not present

## 2018-11-15 DIAGNOSIS — Z85828 Personal history of other malignant neoplasm of skin: Secondary | ICD-10-CM | POA: Diagnosis not present

## 2018-11-15 DIAGNOSIS — D539 Nutritional anemia, unspecified: Secondary | ICD-10-CM | POA: Diagnosis not present

## 2018-11-17 DIAGNOSIS — Z9181 History of falling: Secondary | ICD-10-CM | POA: Diagnosis not present

## 2018-11-17 DIAGNOSIS — F1020 Alcohol dependence, uncomplicated: Secondary | ICD-10-CM | POA: Diagnosis not present

## 2018-11-17 DIAGNOSIS — I739 Peripheral vascular disease, unspecified: Secondary | ICD-10-CM | POA: Diagnosis not present

## 2018-11-17 DIAGNOSIS — I110 Hypertensive heart disease with heart failure: Secondary | ICD-10-CM | POA: Diagnosis not present

## 2018-11-17 DIAGNOSIS — I252 Old myocardial infarction: Secondary | ICD-10-CM | POA: Diagnosis not present

## 2018-11-17 DIAGNOSIS — D539 Nutritional anemia, unspecified: Secondary | ICD-10-CM | POA: Diagnosis not present

## 2018-11-17 DIAGNOSIS — L219 Seborrheic dermatitis, unspecified: Secondary | ICD-10-CM | POA: Diagnosis not present

## 2018-11-17 DIAGNOSIS — N529 Male erectile dysfunction, unspecified: Secondary | ICD-10-CM | POA: Diagnosis not present

## 2018-11-17 DIAGNOSIS — I25118 Atherosclerotic heart disease of native coronary artery with other forms of angina pectoris: Secondary | ICD-10-CM | POA: Diagnosis not present

## 2018-11-17 DIAGNOSIS — Z955 Presence of coronary angioplasty implant and graft: Secondary | ICD-10-CM | POA: Diagnosis not present

## 2018-11-17 DIAGNOSIS — Z85828 Personal history of other malignant neoplasm of skin: Secondary | ICD-10-CM | POA: Diagnosis not present

## 2018-11-17 DIAGNOSIS — Z471 Aftercare following joint replacement surgery: Secondary | ICD-10-CM | POA: Diagnosis not present

## 2018-11-17 DIAGNOSIS — I350 Nonrheumatic aortic (valve) stenosis: Secondary | ICD-10-CM | POA: Diagnosis not present

## 2018-11-17 DIAGNOSIS — M150 Primary generalized (osteo)arthritis: Secondary | ICD-10-CM | POA: Diagnosis not present

## 2018-11-17 DIAGNOSIS — Z87891 Personal history of nicotine dependence: Secondary | ICD-10-CM | POA: Diagnosis not present

## 2018-11-17 DIAGNOSIS — K769 Liver disease, unspecified: Secondary | ICD-10-CM | POA: Diagnosis not present

## 2018-11-17 DIAGNOSIS — I509 Heart failure, unspecified: Secondary | ICD-10-CM | POA: Diagnosis not present

## 2018-11-17 DIAGNOSIS — I714 Abdominal aortic aneurysm, without rupture: Secondary | ICD-10-CM | POA: Diagnosis not present

## 2018-11-17 DIAGNOSIS — E785 Hyperlipidemia, unspecified: Secondary | ICD-10-CM | POA: Diagnosis not present

## 2018-11-17 DIAGNOSIS — K219 Gastro-esophageal reflux disease without esophagitis: Secondary | ICD-10-CM | POA: Diagnosis not present

## 2018-11-17 DIAGNOSIS — Z96652 Presence of left artificial knee joint: Secondary | ICD-10-CM | POA: Diagnosis not present

## 2018-11-17 DIAGNOSIS — Z7982 Long term (current) use of aspirin: Secondary | ICD-10-CM | POA: Diagnosis not present

## 2018-11-17 DIAGNOSIS — N400 Enlarged prostate without lower urinary tract symptoms: Secondary | ICD-10-CM | POA: Diagnosis not present

## 2018-11-21 DIAGNOSIS — Z9181 History of falling: Secondary | ICD-10-CM | POA: Diagnosis not present

## 2018-11-21 DIAGNOSIS — I509 Heart failure, unspecified: Secondary | ICD-10-CM | POA: Diagnosis not present

## 2018-11-21 DIAGNOSIS — I350 Nonrheumatic aortic (valve) stenosis: Secondary | ICD-10-CM | POA: Diagnosis not present

## 2018-11-21 DIAGNOSIS — I739 Peripheral vascular disease, unspecified: Secondary | ICD-10-CM | POA: Diagnosis not present

## 2018-11-21 DIAGNOSIS — M150 Primary generalized (osteo)arthritis: Secondary | ICD-10-CM | POA: Diagnosis not present

## 2018-11-21 DIAGNOSIS — E785 Hyperlipidemia, unspecified: Secondary | ICD-10-CM | POA: Diagnosis not present

## 2018-11-21 DIAGNOSIS — I252 Old myocardial infarction: Secondary | ICD-10-CM | POA: Diagnosis not present

## 2018-11-21 DIAGNOSIS — I110 Hypertensive heart disease with heart failure: Secondary | ICD-10-CM | POA: Diagnosis not present

## 2018-11-21 DIAGNOSIS — D539 Nutritional anemia, unspecified: Secondary | ICD-10-CM | POA: Diagnosis not present

## 2018-11-21 DIAGNOSIS — Z955 Presence of coronary angioplasty implant and graft: Secondary | ICD-10-CM | POA: Diagnosis not present

## 2018-11-21 DIAGNOSIS — Z7982 Long term (current) use of aspirin: Secondary | ICD-10-CM | POA: Diagnosis not present

## 2018-11-21 DIAGNOSIS — K219 Gastro-esophageal reflux disease without esophagitis: Secondary | ICD-10-CM | POA: Diagnosis not present

## 2018-11-21 DIAGNOSIS — L219 Seborrheic dermatitis, unspecified: Secondary | ICD-10-CM | POA: Diagnosis not present

## 2018-11-21 DIAGNOSIS — N529 Male erectile dysfunction, unspecified: Secondary | ICD-10-CM | POA: Diagnosis not present

## 2018-11-21 DIAGNOSIS — N400 Enlarged prostate without lower urinary tract symptoms: Secondary | ICD-10-CM | POA: Diagnosis not present

## 2018-11-21 DIAGNOSIS — Z85828 Personal history of other malignant neoplasm of skin: Secondary | ICD-10-CM | POA: Diagnosis not present

## 2018-11-21 DIAGNOSIS — K769 Liver disease, unspecified: Secondary | ICD-10-CM | POA: Diagnosis not present

## 2018-11-21 DIAGNOSIS — F1020 Alcohol dependence, uncomplicated: Secondary | ICD-10-CM | POA: Diagnosis not present

## 2018-11-21 DIAGNOSIS — Z471 Aftercare following joint replacement surgery: Secondary | ICD-10-CM | POA: Diagnosis not present

## 2018-11-21 DIAGNOSIS — Z87891 Personal history of nicotine dependence: Secondary | ICD-10-CM | POA: Diagnosis not present

## 2018-11-21 DIAGNOSIS — Z96652 Presence of left artificial knee joint: Secondary | ICD-10-CM | POA: Diagnosis not present

## 2018-11-21 DIAGNOSIS — I714 Abdominal aortic aneurysm, without rupture: Secondary | ICD-10-CM | POA: Diagnosis not present

## 2018-11-21 DIAGNOSIS — I25118 Atherosclerotic heart disease of native coronary artery with other forms of angina pectoris: Secondary | ICD-10-CM | POA: Diagnosis not present

## 2018-11-23 DIAGNOSIS — M1712 Unilateral primary osteoarthritis, left knee: Secondary | ICD-10-CM | POA: Diagnosis not present

## 2018-11-25 DIAGNOSIS — M25662 Stiffness of left knee, not elsewhere classified: Secondary | ICD-10-CM | POA: Diagnosis not present

## 2018-11-25 DIAGNOSIS — M6281 Muscle weakness (generalized): Secondary | ICD-10-CM | POA: Diagnosis not present

## 2018-11-25 DIAGNOSIS — M25462 Effusion, left knee: Secondary | ICD-10-CM | POA: Diagnosis not present

## 2018-11-25 DIAGNOSIS — R262 Difficulty in walking, not elsewhere classified: Secondary | ICD-10-CM | POA: Diagnosis not present

## 2018-12-17 ENCOUNTER — Other Ambulatory Visit: Payer: Self-pay | Admitting: Cardiovascular Disease

## 2018-12-19 NOTE — Telephone Encounter (Signed)
Amlodipine 2.5 mg refilled. 

## 2019-03-06 ENCOUNTER — Other Ambulatory Visit: Payer: Self-pay

## 2019-03-06 DIAGNOSIS — I713 Abdominal aortic aneurysm, ruptured, unspecified: Secondary | ICD-10-CM

## 2019-03-07 ENCOUNTER — Telehealth (HOSPITAL_COMMUNITY): Payer: Self-pay | Admitting: *Deleted

## 2019-03-07 NOTE — Telephone Encounter (Signed)
The above patient or their representative was contacted and gave the following answers to these questions:         Do you have any of the following symptoms?n  Fever                    Cough                   Shortness of breath  Do  you have any of the following other symptoms? n muscle pain         vomiting,        diarrhea        rash         weakness        red eye        abdominal pain         bruising          bruising or bleeding              joint pain           severe headache    Have you been in contact with someone who was or has been sick in the past 2 weeks?n  Yes                 Unsure                         Unable to assess   Does the person that you were in contact with have any of the following symptoms?   Cough         shortness of breath           muscle pain         vomiting,            diarrhea            rash            weakness           fever            red eye           abdominal pain           bruising  or  bleeding                joint pain                severe headache               Have you  or someone you have been in contact with traveled internationally in th last month?   n      If yes, which countries?   Have you  or someone you have been in contact with traveled outside [place] in th last month?         If yes, which state and city?   COMMENTS OR ACTION PLAN FOR THIS PATIENT:          

## 2019-03-08 ENCOUNTER — Ambulatory Visit (HOSPITAL_COMMUNITY)
Admission: RE | Admit: 2019-03-08 | Discharge: 2019-03-08 | Disposition: A | Discharge status: Home / Self Care | Payer: PPO | Source: Ambulatory Visit | Attending: Vascular Surgery | Admitting: Vascular Surgery

## 2019-03-08 ENCOUNTER — Ambulatory Visit (INDEPENDENT_AMBULATORY_CARE_PROVIDER_SITE_OTHER): Payer: PPO | Admitting: Vascular Surgery

## 2019-03-08 ENCOUNTER — Encounter: Payer: Self-pay | Admitting: Vascular Surgery

## 2019-03-08 ENCOUNTER — Other Ambulatory Visit: Payer: Self-pay

## 2019-03-08 VITALS — BP 150/71 | HR 62 | Temp 97.3°F | Resp 18 | Ht 64.0 in | Wt 145.8 lb

## 2019-03-08 DIAGNOSIS — I713 Abdominal aortic aneurysm, ruptured, unspecified: Secondary | ICD-10-CM

## 2019-03-08 DIAGNOSIS — I714 Abdominal aortic aneurysm, without rupture, unspecified: Secondary | ICD-10-CM

## 2019-03-08 DIAGNOSIS — Z48812 Encounter for surgical aftercare following surgery on the circulatory system: Secondary | ICD-10-CM

## 2019-03-08 NOTE — Progress Notes (Signed)
-   Patient name: Corey Garcia MRN: [id number] DOB: Dec 01, 1945 Sex: male  REASON FOR VISIT:   Follow-up after endovascular aneurysm repair  HPI:   Corey Garcia is a pleasant 72 y.o. male who presented with a 3.2 cm saccular abdominal aortic aneurysm.  The patient underwent endovascular repair of this aneurysm on 01/13/2018.  This was done with a 26 mm x 21 mm 10 cm device.  There was some filling of the sac via a lumbar artery consistent with a type II endoleak.  The patient had a 2.5 cm right common iliac artery.  At the time of his last visit the maximum diameter of the aneurysm was 3.2 cm.  He was noted to have a jet near the proximal graft suggesting a possible type I endoleak.  However, he underwent a CT scan which only showed the type II endoleak.  There was no evidence of a type I endoleak.  The aneurysm was 3.2 cm in maximum diameter.  The right common iliac artery was 2.5 cm in maximum diameter.  Since I saw him last he has undergone a left knee replacement.  He had been having some right hip pain but is doing better since his knee replacement.  I do not get any clear-cut history of claudication or rest pain.  He denies any abdominal pain or back pain.  He is not a smoker.  He is on aspirin and is on a statin.  Past Medical History:  Diagnosis Date  . Alcoholism ([place]) 03/15/2014  . Anemia   . Arthritis    all over, some ra  . Bradycardia by electrocardiogram    BB decreased 11/12/11  . CAD (coronary artery disease)    last cath=01/2008, PCI  . CHF (congestive heart failure) ([place])   . Dyslipidemia   . ED (erectile dysfunction)    viagra helps  . GERD (gastroesophageal reflux disease)   . HTN (hypertension)   . Iron overload 03/15/2014  . Liver disease due to alcohol ([place]) 03/15/2014   pt .denies at preop  . Murmur, cardiac    faint early systolic grade 1/6 aortic ejection murmur  . Myocardial infarction ([place])   . Peripheral vascular disease ([place])   . Rib pain  04/16/2014  . Skin cancer    basal cell ca X 4    Family History  Problem Relation Age of Onset  . Heart failure Mother        heart disease  . Heart disease Mother   . Heart failure Father        heart disease  . Heart disease Father   . Heart disease Brother   . Heart failure Brother        "bad heart valve"    SOCIAL HISTORY: Social History   Tobacco Use  . Smoking status: Former Smoker    Quit date: 10/31/1982    Years since quitting: 36.3  . Smokeless tobacco: Never Used  Substance Use Topics  . Alcohol use: Yes    Alcohol/week: 28.0 standard drinks    Types: 28 Cans of beer per week    Comment: "3-4 beers or hard apple ciders/ day" 01/10/2018    Allergies  Allergen Reactions  . Lisinopril Other (See Comments)    PHOTOSENSITIVITY ON ARMS  . Minocin [Minocycline Hcl]     Flank pain  . Sulfa Antibiotics   . Thiazide-Type Diuretics Other (See Comments)    "Lowered my blood pressure"  . Codeine Nausea Only  Current Outpatient Medications  Medication Sig Dispense Refill  . Acetaminophen (TYLENOL 8 HOUR ARTHRITIS PAIN PO)     . amLODipine (NORVASC) 2.5 MG tablet TAKE 1 TABLET BY MOUTH EVERY DAY 90 tablet 1  . aspirin EC 81 MG tablet Take 1 tablet (81 mg total) by mouth 2 (two) times daily. For DVT prophylaxis for 30 days after surgery. 60 tablet 0  . atorvastatin (LIPITOR) 20 MG tablet Take 20 mg by mouth daily.    . bisoprolol (ZEBETA) 10 MG tablet Take 15 mg by mouth daily.     . cholecalciferol (VITAMIN D) 1000 units tablet Take 1,000 Units by mouth daily.    Mariane Baumgarten Sodium 100 MG capsule     . hydrocortisone (PROCTO-MED HC) 2.5 % rectal cream Place 1 application rectally as needed for hemorrhoids or itching.    . loratadine (CLARITIN) 10 MG tablet Take 10 mg by mouth daily.     . Multiple Vitamins-Minerals (ONE DAILY MULTIVITAMIN MEN PO) Take 1 tablet by mouth daily.     . pantoprazole (PROTONIX) 40 MG tablet Take 40 mg by mouth daily.     . Probiotic  Product (PROBIOTIC PO) Take 1 tablet by mouth daily.    . sildenafil (VIAGRA) 100 MG tablet     . tacrolimus (PROTOPIC) 0.03 % ointment Apply 1 application topically daily as needed (dermatitis on face).     . tamsulosin (FLOMAX) 0.4 MG CAPS capsule Take 0.4 mg by mouth daily.     No current facility-administered medications for this visit.     REVIEW OF SYSTEMS:  [X]  denotes positive finding, [ ]  denotes negative finding Cardiac  Comments:  Chest pain or chest pressure:    Shortness of breath upon exertion:    Short of breath when lying flat:    Irregular heart rhythm:        Vascular    Pain in calf, thigh, or hip brought on by ambulation: x   Pain in feet at night that wakes you up from your sleep:     Blood clot in your veins:    Leg swelling:         Pulmonary    Oxygen at home:    Productive cough:     Wheezing:         Neurologic    Sudden weakness in arms or legs:     Sudden numbness in arms or legs:     Sudden onset of difficulty speaking or slurred speech:    Temporary loss of vision in one eye:     Problems with dizziness:         Gastrointestinal    Blood in stool:     Vomited blood:         Genitourinary    Burning when urinating:     Blood in urine:        Psychiatric    Major depression:         Hematologic    Bleeding problems:    Problems with blood clotting too easily:        Skin    Rashes or ulcers:        Constitutional    Fever or chills:     PHYSICAL EXAM:   Vitals:   03/08/19 0852 03/08/19 0854  BP: (!) 151/77 (!) 150/71  Pulse: 62   Resp: 18   Temp: (!) 97.3 F (36.3 C)   SpO2: 100%   Weight: 145 lb 12.8 oz (  66.1 kg)   Height: 5\' 4"  (1.626 m)     GENERAL: The patient is a well-nourished male, in no acute distress. The vital signs are documented above. CARDIAC: There is a regular rate and rhythm.  He has a systolic ejection murmur. VASCULAR: I do not detect carotid bruits. He has palpable femoral and popliteal pulses  bilaterally. I cannot palpate pedal pulses however both feet are warm and well-perfused. He has no significant lower extremity swelling. PULMONARY: There is good air exchange bilaterally without wheezing or rales. ABDOMEN: Soft and non-tender with normal pitched bowel sounds.  MUSCULOSKELETAL: There are no major deformities or cyanosis. NEUROLOGIC: No focal weakness or paresthesias are detected. SKIN: There are no ulcers or rashes noted. PSYCHIATRIC: The patient has a normal affect.  DATA:    DUPLEX ABDOMINAL AORTA: I have independently interpreted the duplex of the abdominal aorta.  He does have some overlying bowel gas which makes visualization is difficult in some areas.  However the aneurysm has decreased in size with a maximum diameter of 2.8 cm whereas it was previously 3.2 cm.  MEDICAL ISSUES:   S/P ENDOVASCULAR ANEURYSM REPAIR: This patient underwent an endovascular aneurysm repair for a saccular aneurysm on 01/13/2018.  On follow-up study today the aneurysm has now decreased in size to 2.8 cm in maximum diameter.  He does have some overlying bowel gas however given that the aneurysm is smaller I think would be reasonable to try for for a follow-up duplex scan in 9 months.  We will need to be able to visualize the right common iliac artery at that time.  Otherwise we would have to get a CT scan.  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  Deitra Mayo [facility] [phone]

## 2019-03-30 DIAGNOSIS — D649 Anemia, unspecified: Secondary | ICD-10-CM | POA: Diagnosis not present

## 2019-03-30 DIAGNOSIS — E7800 Pure hypercholesterolemia, unspecified: Secondary | ICD-10-CM | POA: Diagnosis not present

## 2019-03-30 DIAGNOSIS — I10 Essential (primary) hypertension: Secondary | ICD-10-CM | POA: Diagnosis not present

## 2019-03-30 DIAGNOSIS — M1712 Unilateral primary osteoarthritis, left knee: Secondary | ICD-10-CM | POA: Diagnosis not present

## 2019-03-30 DIAGNOSIS — I2510 Atherosclerotic heart disease of native coronary artery without angina pectoris: Secondary | ICD-10-CM | POA: Diagnosis not present

## 2019-03-30 DIAGNOSIS — N400 Enlarged prostate without lower urinary tract symptoms: Secondary | ICD-10-CM | POA: Diagnosis not present

## 2019-04-04 DIAGNOSIS — M25551 Pain in right hip: Secondary | ICD-10-CM | POA: Diagnosis not present

## 2019-04-07 DIAGNOSIS — M25551 Pain in right hip: Secondary | ICD-10-CM | POA: Diagnosis not present

## 2019-04-07 DIAGNOSIS — M545 Low back pain: Secondary | ICD-10-CM | POA: Diagnosis not present

## 2019-05-01 DIAGNOSIS — M5416 Radiculopathy, lumbar region: Secondary | ICD-10-CM | POA: Diagnosis not present

## 2019-05-04 DIAGNOSIS — M545 Low back pain: Secondary | ICD-10-CM | POA: Diagnosis not present

## 2019-05-04 DIAGNOSIS — M5416 Radiculopathy, lumbar region: Secondary | ICD-10-CM | POA: Diagnosis not present

## 2019-05-04 DIAGNOSIS — M5136 Other intervertebral disc degeneration, lumbar region: Secondary | ICD-10-CM | POA: Diagnosis not present

## 2019-05-11 DIAGNOSIS — M545 Low back pain: Secondary | ICD-10-CM | POA: Diagnosis not present

## 2019-05-11 DIAGNOSIS — M5136 Other intervertebral disc degeneration, lumbar region: Secondary | ICD-10-CM | POA: Diagnosis not present

## 2019-05-11 DIAGNOSIS — M5416 Radiculopathy, lumbar region: Secondary | ICD-10-CM | POA: Diagnosis not present

## 2019-05-15 DIAGNOSIS — M545 Low back pain: Secondary | ICD-10-CM | POA: Diagnosis not present

## 2019-05-15 DIAGNOSIS — M5416 Radiculopathy, lumbar region: Secondary | ICD-10-CM | POA: Diagnosis not present

## 2019-05-15 DIAGNOSIS — M5136 Other intervertebral disc degeneration, lumbar region: Secondary | ICD-10-CM | POA: Diagnosis not present

## 2019-05-16 DIAGNOSIS — M545 Low back pain: Secondary | ICD-10-CM | POA: Diagnosis not present

## 2019-05-16 DIAGNOSIS — M5416 Radiculopathy, lumbar region: Secondary | ICD-10-CM | POA: Diagnosis not present

## 2019-05-16 DIAGNOSIS — M5136 Other intervertebral disc degeneration, lumbar region: Secondary | ICD-10-CM | POA: Diagnosis not present

## 2019-05-23 DIAGNOSIS — M5416 Radiculopathy, lumbar region: Secondary | ICD-10-CM | POA: Diagnosis not present

## 2019-05-23 DIAGNOSIS — M5136 Other intervertebral disc degeneration, lumbar region: Secondary | ICD-10-CM | POA: Diagnosis not present

## 2019-05-23 DIAGNOSIS — M545 Low back pain: Secondary | ICD-10-CM | POA: Diagnosis not present

## 2019-05-25 DIAGNOSIS — M545 Low back pain: Secondary | ICD-10-CM | POA: Diagnosis not present

## 2019-05-25 DIAGNOSIS — M5416 Radiculopathy, lumbar region: Secondary | ICD-10-CM | POA: Diagnosis not present

## 2019-05-25 DIAGNOSIS — M5136 Other intervertebral disc degeneration, lumbar region: Secondary | ICD-10-CM | POA: Diagnosis not present

## 2019-06-06 ENCOUNTER — Telehealth: Payer: Self-pay | Admitting: Cardiovascular Disease

## 2019-06-06 NOTE — Telephone Encounter (Signed)
° ° ° °*  STAT* If patient is at the pharmacy, call can be transferred to refill team.   1. Which medications need to be refilled? (please list name of each medication and dose if known) amLODipine (NORVASC) 2.5 MG tablet  2. Which pharmacy/location (including street and city if local pharmacy) is medication to be sent to? [facility]   3. Do they need a 30 day or 90 day supply? [place]

## 2019-06-07 ENCOUNTER — Other Ambulatory Visit: Payer: Self-pay | Admitting: *Deleted

## 2019-06-07 MED ORDER — AMLODIPINE BESYLATE 2.5 MG PO TABS: 2.5000 mg | ORAL_TABLET | Freq: Every day | ORAL | 0 refills | Status: DC

## 2019-06-07 NOTE — Telephone Encounter (Signed)
Requested Prescriptions   Signed Prescriptions Disp Refills  . amLODipine (NORVASC) 2.5 MG tablet 90 tablet 0    Sig: Take 1 tablet (2.5 mg total) by mouth daily.    Authorizing Provider: Sanda Klein    Ordering User: Britt Bottom

## 2019-06-09 DIAGNOSIS — I2510 Atherosclerotic heart disease of native coronary artery without angina pectoris: Secondary | ICD-10-CM | POA: Diagnosis not present

## 2019-06-09 DIAGNOSIS — Z8589 Personal history of malignant neoplasm of other organs and systems: Secondary | ICD-10-CM | POA: Diagnosis not present

## 2019-06-09 DIAGNOSIS — Z955 Presence of coronary angioplasty implant and graft: Secondary | ICD-10-CM | POA: Diagnosis not present

## 2019-06-09 DIAGNOSIS — I10 Essential (primary) hypertension: Secondary | ICD-10-CM | POA: Diagnosis not present

## 2019-06-09 DIAGNOSIS — I714 Abdominal aortic aneurysm, without rupture: Secondary | ICD-10-CM | POA: Diagnosis not present

## 2019-06-09 DIAGNOSIS — D649 Anemia, unspecified: Secondary | ICD-10-CM | POA: Diagnosis not present

## 2019-06-09 DIAGNOSIS — E7800 Pure hypercholesterolemia, unspecified: Secondary | ICD-10-CM | POA: Diagnosis not present

## 2019-06-09 DIAGNOSIS — E871 Hypo-osmolality and hyponatremia: Secondary | ICD-10-CM | POA: Diagnosis not present

## 2019-06-09 DIAGNOSIS — N400 Enlarged prostate without lower urinary tract symptoms: Secondary | ICD-10-CM | POA: Diagnosis not present

## 2019-06-23 DIAGNOSIS — I10 Essential (primary) hypertension: Secondary | ICD-10-CM | POA: Diagnosis not present

## 2019-06-23 DIAGNOSIS — Z8589 Personal history of malignant neoplasm of other organs and systems: Secondary | ICD-10-CM | POA: Diagnosis not present

## 2019-06-23 DIAGNOSIS — Z955 Presence of coronary angioplasty implant and graft: Secondary | ICD-10-CM | POA: Diagnosis not present

## 2019-06-23 DIAGNOSIS — N400 Enlarged prostate without lower urinary tract symptoms: Secondary | ICD-10-CM | POA: Diagnosis not present

## 2019-06-23 DIAGNOSIS — D649 Anemia, unspecified: Secondary | ICD-10-CM | POA: Diagnosis not present

## 2019-06-23 DIAGNOSIS — E7800 Pure hypercholesterolemia, unspecified: Secondary | ICD-10-CM | POA: Diagnosis not present

## 2019-06-23 DIAGNOSIS — I2510 Atherosclerotic heart disease of native coronary artery without angina pectoris: Secondary | ICD-10-CM | POA: Diagnosis not present

## 2019-06-23 DIAGNOSIS — I714 Abdominal aortic aneurysm, without rupture: Secondary | ICD-10-CM | POA: Diagnosis not present

## 2019-06-26 DIAGNOSIS — D649 Anemia, unspecified: Secondary | ICD-10-CM | POA: Diagnosis not present

## 2019-06-26 DIAGNOSIS — I10 Essential (primary) hypertension: Secondary | ICD-10-CM | POA: Diagnosis not present

## 2019-06-26 DIAGNOSIS — I2510 Atherosclerotic heart disease of native coronary artery without angina pectoris: Secondary | ICD-10-CM | POA: Diagnosis not present

## 2019-06-26 DIAGNOSIS — N400 Enlarged prostate without lower urinary tract symptoms: Secondary | ICD-10-CM | POA: Diagnosis not present

## 2019-06-26 DIAGNOSIS — E7800 Pure hypercholesterolemia, unspecified: Secondary | ICD-10-CM | POA: Diagnosis not present

## 2019-06-26 DIAGNOSIS — M1712 Unilateral primary osteoarthritis, left knee: Secondary | ICD-10-CM | POA: Diagnosis not present

## 2019-06-28 DIAGNOSIS — Z23 Encounter for immunization: Secondary | ICD-10-CM | POA: Diagnosis not present

## 2019-06-28 DIAGNOSIS — Z0000 Encounter for general adult medical examination without abnormal findings: Secondary | ICD-10-CM | POA: Diagnosis not present

## 2019-06-29 ENCOUNTER — Telehealth: Payer: Self-pay

## 2019-06-29 NOTE — Telephone Encounter (Signed)
Called regarding lab work faxed from Dr. Drema Dallas office. He has a lab appt on 11/24 with Dr. Drema Dallas and will get them to fax results to Dr. Alvy Bimler.

## 2019-07-11 ENCOUNTER — Other Ambulatory Visit: Payer: Self-pay

## 2019-07-11 ENCOUNTER — Ambulatory Visit: Payer: PPO | Admitting: Cardiovascular Disease

## 2019-07-11 ENCOUNTER — Encounter: Payer: Self-pay | Admitting: Cardiovascular Disease

## 2019-07-11 VITALS — BP 133/72 | HR 58 | Temp 98.2°F | Ht 64.0 in | Wt 141.4 lb

## 2019-07-11 DIAGNOSIS — I350 Nonrheumatic aortic (valve) stenosis: Secondary | ICD-10-CM

## 2019-07-11 MED ORDER — ASPIRIN EC 81 MG PO TBEC: 81.0000 mg | DELAYED_RELEASE_TABLET | Freq: Every day | ORAL | 0 refills | Status: DC

## 2019-07-11 NOTE — Progress Notes (Signed)
Cardiology Office Note   Date:  07/11/2019   ID:  Corey Garcia, Corey Garcia 01/26/1946, MRN [id number]  PCP:  [name], MD  Cardiologist:  Dr.Zohar Maroney   No chief complaint on file.    History of Present Illness: Corey Garcia is a 73 y.o. male with CAD, vasovagal syncope, AAA s/p EVAR, hypertension, hyperlipidemia, chronic liver disease.  He has not required coronary intervention since 2009 when he had cutting balloon atherectomy of the OM2 and DES to the LAD, residual 50% RCA stenosis. He had a low risk nuclear study April 2014.  The patient specifically denies any chest pain at rest exertion, dyspnea at rest or with exertion, orthopnea, paroxysmal nocturnal dyspnea, syncope, palpitations, focal neurological deficits, intermittent claudication, lower extremity edema, unexplained weight gain, cough, hemoptysis or wheezing.  Good recovery after left TKR. Some sciatica on R, better after PT.  Followed by Dr. Scot Dock, had CTA inJanuary.    Past Medical History:  Diagnosis Date  . Alcoholism ([place]) 03/15/2014  . Anemia   . Arthritis    all over, some ra  . Bradycardia by electrocardiogram    BB decreased 11/12/11  . CAD (coronary artery disease)    last cath=01/2008, PCI  . CHF (congestive heart failure) ([place])   . Dyslipidemia   . ED (erectile dysfunction)    viagra helps  . GERD (gastroesophageal reflux disease)   . HTN (hypertension)   . Iron overload 03/15/2014  . Liver disease due to alcohol ([place]) 03/15/2014   pt .denies at preop  . Murmur, cardiac    faint early systolic grade 1/6 aortic ejection murmur  . Myocardial infarction ([place])   . Peripheral vascular disease ([place])   . Rib pain 04/16/2014  . Skin cancer    basal cell ca X 4    Past Surgical History:  Procedure Laterality Date  . ABDOMINAL AORTIC ENDOVASCULAR STENT GRAFT N/A 01/13/2018   Procedure: ABDOMINAL AORTIC ENDOVASCULAR STENT GRAFT;  Surgeon: Angelia Mould, MD;  Location: [facility] OR;   Service: Vascular;  Laterality: N/A;  . CORONARY ANGIOPLASTY WITH STENT PLACEMENT  01/31/2008   PCI with cutting balloon arthrectomy of the ostium of the circ obtuse marg 2 vessel  . CORONARY ANGIOPLASTY WITH STENT PLACEMENT  01/27/08   nl LV function, PTCA/stenting of LAD with 3x26mm Cypher post dilated 3.7mm  . EYE SURGERY     cataract bil  . FRACTURE SURGERY     right elbow when 73 years old  . HERNIA REPAIR    . KNEE ARTHROSCOPY Left   . MOHS SURGERY     x4  . NASAL SEPTUM SURGERY     x2  . TOTAL KNEE ARTHROPLASTY Left 11/08/2018   Procedure: TOTAL KNEE ARTHROPLASTY;  Surgeon: Renette Butters, MD;  Location: [facility] ORS;  Service: Orthopedics;  Laterality: Left;     Current Outpatient Medications  Medication Sig Dispense Refill  . Acetaminophen (TYLENOL 8 HOUR ARTHRITIS PAIN PO)     . amLODipine (NORVASC) 2.5 MG tablet Take 1 tablet (2.5 mg total) by mouth daily. 90 tablet 0  . aspirin EC 81 MG tablet Take 1 tablet (81 mg total) by mouth 2 (two) times daily. For DVT prophylaxis for 30 days after surgery. 60 tablet 0  . atorvastatin (LIPITOR) 20 MG tablet Take 20 mg by mouth daily.    . bisoprolol (ZEBETA) 10 MG tablet Take 15 mg by mouth daily.     . cholecalciferol (VITAMIN D) 1000 units tablet Take 1,000  Units by mouth daily.    Mariane Baumgarten Sodium 100 MG capsule     . hydrocortisone (PROCTO-MED HC) 2.5 % rectal cream Place 1 application rectally as needed for hemorrhoids or itching.    . loratadine (CLARITIN) 10 MG tablet Take 10 mg by mouth daily.     . Multiple Vitamins-Minerals (ONE DAILY MULTIVITAMIN MEN PO) Take 1 tablet by mouth daily.     . pantoprazole (PROTONIX) 40 MG tablet Take 40 mg by mouth daily.     . Probiotic Product (PROBIOTIC PO) Take 1 tablet by mouth daily.    . sildenafil (VIAGRA) 100 MG tablet     . tacrolimus (PROTOPIC) 0.03 % ointment Apply 1 application topically daily as needed (dermatitis on face).     . tamsulosin (FLOMAX) 0.4 MG CAPS capsule Take 0.4 mg  by mouth daily.     No current facility-administered medications for this visit.     Allergies:   Lisinopril, Minocin [minocycline hcl], Sulfa antibiotics, Thiazide-type diuretics, and Codeine    Social History:  The patient  reports that he quit smoking about 36 years ago. He has never used smokeless tobacco. He reports current alcohol use of about 28.0 standard drinks of alcohol per week. He reports that he does not use drugs.   Family History:  The patient's family history includes Heart disease in his brother, father, and mother; Heart failure in his brother, father, and mother.    ROS: Please see history of present illness.  All other systems are reviewed and are negative.    PHYSICAL EXAM: VS:  BP 133/72   Pulse (!) 58   Temp 98.2 F (36.8 C)   Ht 5\' 4"  (1.626 m)   Wt 141 lb 6.4 oz (64.1 kg)   SpO2 99%   BMI 24.27 kg/m  , BMI Body mass index is 24.27 kg/m.   General: Alert, oriented x3, no distress,  Head: no evidence of trauma, PERRL, EOMI, no exophtalmos or lid lag, no myxedema, no xanthelasma; normal ears, nose and oropharynx Neck: normal jugular venous pulsations and no hepatojugular reflux; brisk carotid pulses without delay and no carotid bruits Chest: clear to auscultation, no signs of consolidation by percussion or palpation, normal fremitus, symmetrical and full respiratory excursions Cardiovascular: normal position and quality of the apical impulse, regular rhythm, normal first and second heart sounds, 3/6 aortic ejection murmur, no diastolic murmurs, rubs or gallops Abdomen: no tenderness or distention, no masses by palpation, no abnormal pulsatility or arterial bruits, normal bowel sounds, no hepatosplenomegaly Extremities: no clubbing, cyanosis or edema; 2+ radial, ulnar and brachial pulses bilaterally; 2+ right femoral, posterior tibial and dorsalis pedis pulses; 2+ left femoral, posterior tibial and dorsalis pedis pulses; no subclavian or femoral bruits  Neurological: grossly nonfocal Psych: Normal mood and affect  EKG: ECG is ordered today which shows normal sinus rhythm, normal tracing.  CT Angio 09/20/2018 1. Slight decrease in aneurysm sac size at the level of isolated saccular infrarenal aortic aneurysm with maximal aortic diameter now measuring 3.2 cm. 2. Persistent endoleak in the inferior and anterior aspect of the aneurysm sac, likely a type II endoleak. Most likely inflow is felt to be from retrograde flow in the inferior mesenteric artery given location of the sac perfusion on the arterial phase. There are some small visualized lumbar arteries as well which could be acting as inflow or outflow but are not in close proximity to the area contrast opacification. Although persistent, this endoleak is not resulting in any  increase in aneurysm sac size. 3. Stable aneurysmal disease at the origin of the right common iliac artery measuring 1.7 cm and in the mid to distal right common iliac artery measuring 2.5 cm.   Recent Labs: 11/01/2018: BUN 19; Creatinine, Ser 0.97; Hemoglobin 12.4; Platelets 193; Potassium 4.0; Sodium 135  June 16, 2018 Dr. Drema Dallas: Total cholesterol 142, HDL 48, LDL 76, triglycerides 90 Hemoglobin of 11.0, creatinine 0.96  Lipid Panel    Component Value Date/Time   CHOL  01/27/2008 0652    157        ATP III CLASSIFICATION:  <200     mg/dL   Desirable  200-239  mg/dL   Borderline High  >=240    mg/dL   High   TRIG 52 01/27/2008 0652   HDL 53 01/27/2008 0652   CHOLHDL 3.0 01/27/2008 0652   VLDL 10 01/27/2008 0652   LDLCALC  01/27/2008 0652    94        Total Cholesterol/HDL:CHD Risk Coronary Heart Disease Risk Table                     Men   Women  1/2 Average Risk   3.4   3.3      Wt Readings from Last 3 Encounters:  07/11/19 141 lb 6.4 oz (64.1 kg)  03/08/19 145 lb 12.8 oz (66.1 kg)  11/08/18 148 lb (67.1 kg)     No diagnosis found.   ASSESSMENT AND PLAN:  1. CAD: Angina free.   Last evaluation was a low risk nuclear stress test in 2014, no revascularization since 2009.  Normal left ventricular ejection fraction by echo in March 2018. 2. AAA s/p EVAR: reviewed CT angio results from January. Seeing Dr. Scot Dock. 3. HLP: excellent LDL and HDL. 4. HTN: well controlled 5. AS: Asymptomatic, very mild by echo in March 2018.  Murmur sounds much louder today, check echo.   Sanda Klein, MD, [facility] [phone] office [phone] pager

## 2019-07-11 NOTE — Patient Instructions (Signed)
Medication Instructions:  DECREASE the Aspirin to 81 mg once daily  *If you need a refill on your cardiac medications before your next appointment, please call your pharmacy*  Lab Work: None ordered If you have labs (blood work) drawn today and your tests are completely normal, you will receive your results only by: Marland Kitchen MyChart Message (if you have MyChart) OR . A paper copy in the mail If you have any lab test that is abnormal or we need to change your treatment, we will call you to review the results.  Testing/Procedures: Your physician has requested that you have an echocardiogram. Echocardiography is a painless test that uses sound waves to create images of your heart. It provides your doctor with information about the size and shape of your heart and how well your heart's chambers and valves are working. You may receive an ultrasound enhancing agent through an IV if needed to better visualize your heart during the echo.This procedure takes approximately one hour. There are no restrictions for this procedure. This will take place at the [address].    Follow-Up: At [facility], you and your health needs are our priority.  As part of our continuing mission to provide you with exceptional heart care, we have created designated Provider Care Teams.  These Care Teams include your primary Cardiologist (physician) and Advanced Practice Providers (APPs -  Physician Assistants and Nurse Practitioners) who all work together to provide you with the care you need, when you need it.  Your next appointment:   12 months  The format for your next appointment:   In Person  Provider:   Sanda Klein, MD

## 2019-07-19 ENCOUNTER — Other Ambulatory Visit: Payer: Self-pay

## 2019-07-19 ENCOUNTER — Ambulatory Visit (HOSPITAL_COMMUNITY): Payer: PPO | Attending: Cardiovascular Disease

## 2019-07-19 DIAGNOSIS — I350 Nonrheumatic aortic (valve) stenosis: Secondary | ICD-10-CM | POA: Diagnosis not present

## 2019-07-21 ENCOUNTER — Other Ambulatory Visit: Payer: Self-pay | Admitting: *Deleted

## 2019-07-21 DIAGNOSIS — I350 Nonrheumatic aortic (valve) stenosis: Secondary | ICD-10-CM

## 2019-07-25 DIAGNOSIS — R945 Abnormal results of liver function studies: Secondary | ICD-10-CM | POA: Diagnosis not present

## 2019-07-25 DIAGNOSIS — R7301 Impaired fasting glucose: Secondary | ICD-10-CM | POA: Diagnosis not present

## 2019-08-08 DIAGNOSIS — L309 Dermatitis, unspecified: Secondary | ICD-10-CM | POA: Diagnosis not present

## 2019-08-08 DIAGNOSIS — L720 Epidermal cyst: Secondary | ICD-10-CM | POA: Diagnosis not present

## 2019-08-29 DIAGNOSIS — I10 Essential (primary) hypertension: Secondary | ICD-10-CM | POA: Diagnosis not present

## 2019-08-29 DIAGNOSIS — E7800 Pure hypercholesterolemia, unspecified: Secondary | ICD-10-CM | POA: Diagnosis not present

## 2019-08-29 DIAGNOSIS — N400 Enlarged prostate without lower urinary tract symptoms: Secondary | ICD-10-CM | POA: Diagnosis not present

## 2019-08-29 DIAGNOSIS — D649 Anemia, unspecified: Secondary | ICD-10-CM | POA: Diagnosis not present

## 2019-08-29 DIAGNOSIS — I2510 Atherosclerotic heart disease of native coronary artery without angina pectoris: Secondary | ICD-10-CM | POA: Diagnosis not present

## 2019-08-29 DIAGNOSIS — M1712 Unilateral primary osteoarthritis, left knee: Secondary | ICD-10-CM | POA: Diagnosis not present

## 2019-08-29 IMAGING — CT CT CTA ABD/PEL W/CM AND/OR W/O CM
1 of 5 series · 13 of 32 positions shown, 18 images · IV contrast (iopamidol)
Comparison: 11/08/2017

CLINICAL DATA: Abdominal aortic aneurysm

EXAM:
CTA ABDOMEN AND PELVIS wITHOUT AND WITH CONTRAST
TECHNIQUE: Multidetector CT imaging of the abdomen and pelvis was performed
using the standard protocol during bolus administration of
intravenous contrast. Multiplanar reconstructed images and MIPs were
obtained and reviewed to evaluate the vascular anatomy.
CONTRAST:  75mL [id number] IOPAMIDOL ([id number]) INJECTION 76%

[Series 6: thins · axial · 0.77mm/px · z∈[-558,-176]mm · 13 of 709 slices shown, 18 images]
[im 36/709  soft-tissue]
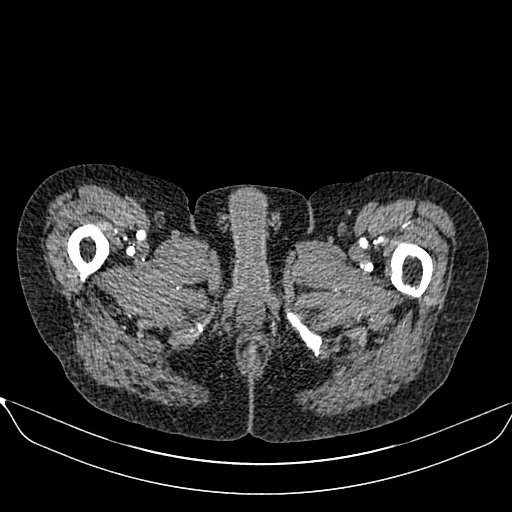
[im 36/709  bone]
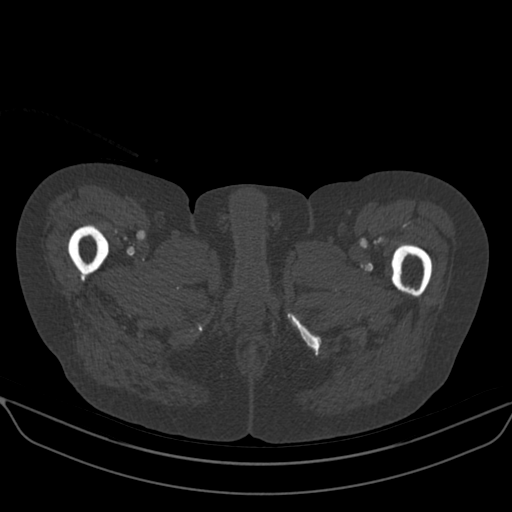
[im 107/709  soft-tissue]
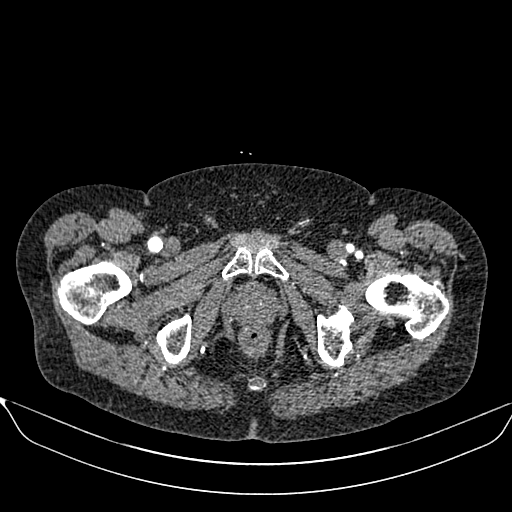
[im 142/709  soft-tissue]
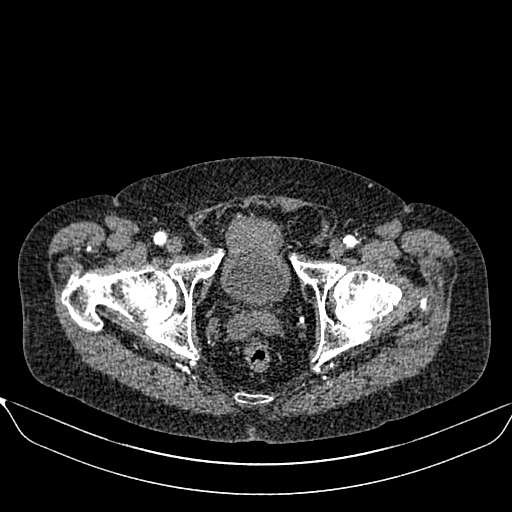
[im 213/709  soft-tissue]
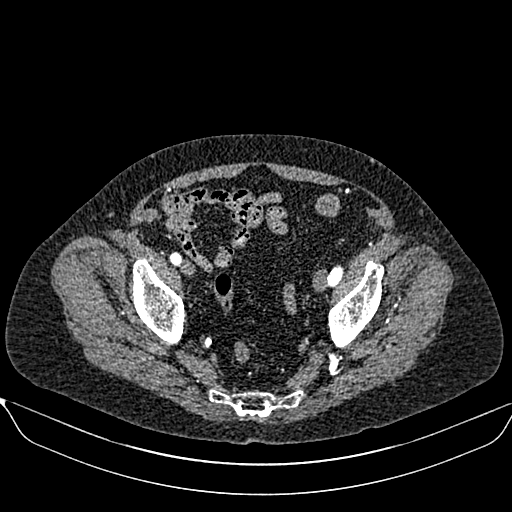
[im 284/709  soft-tissue]
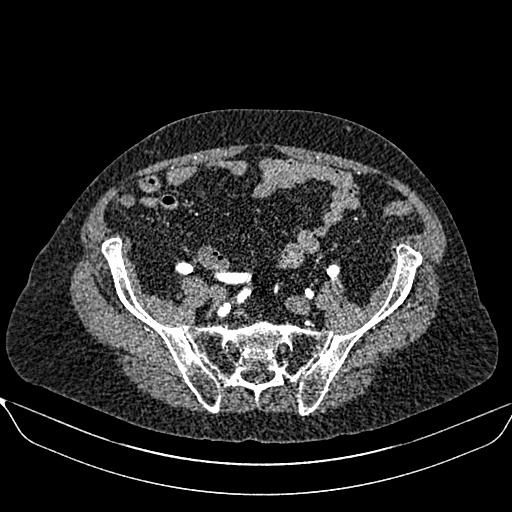
[im 319/709  soft-tissue]
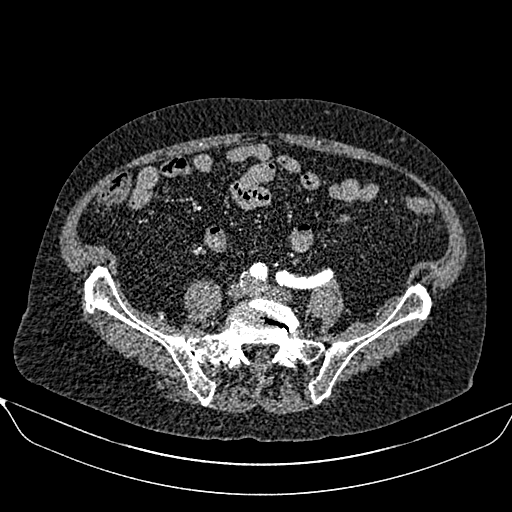
[im 390/709  soft-tissue]
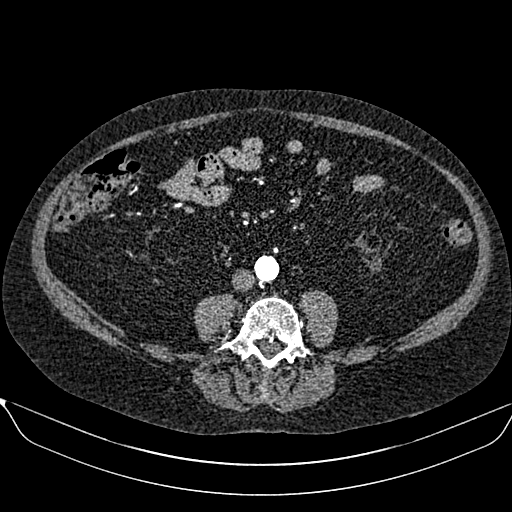
[im 425/709  soft-tissue]
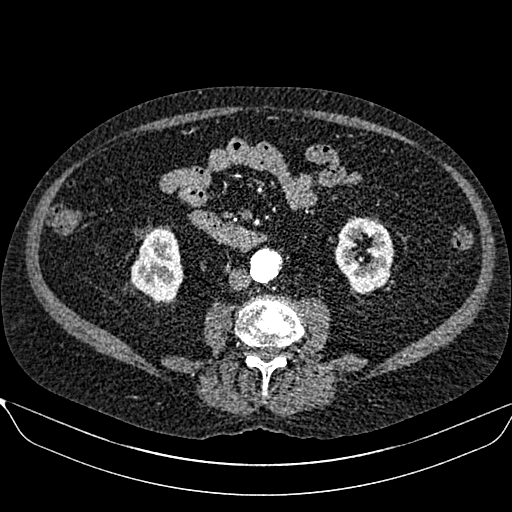
[im 496/709  soft-tissue]
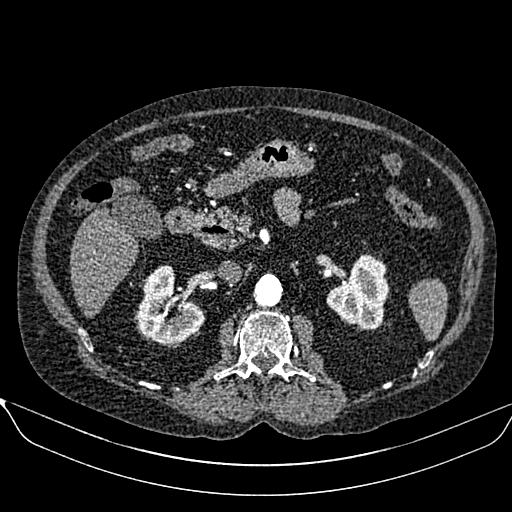
[im 496/709  bone]
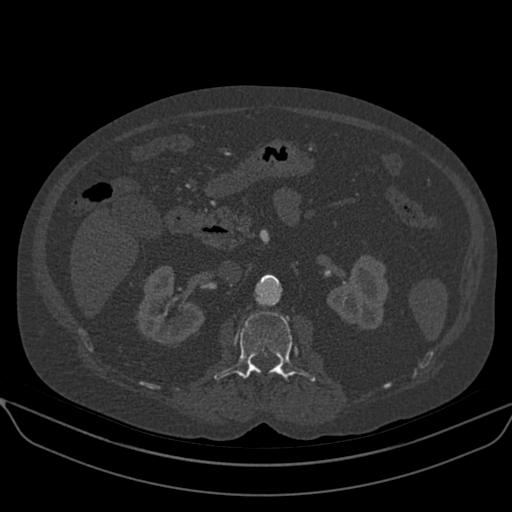
[im 567/709  soft-tissue]
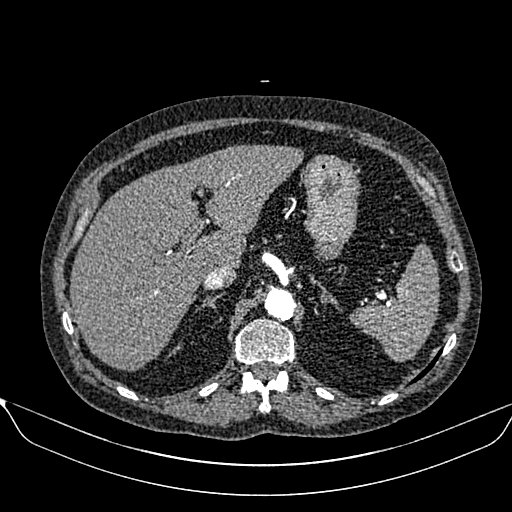
[im 567/709  lung]
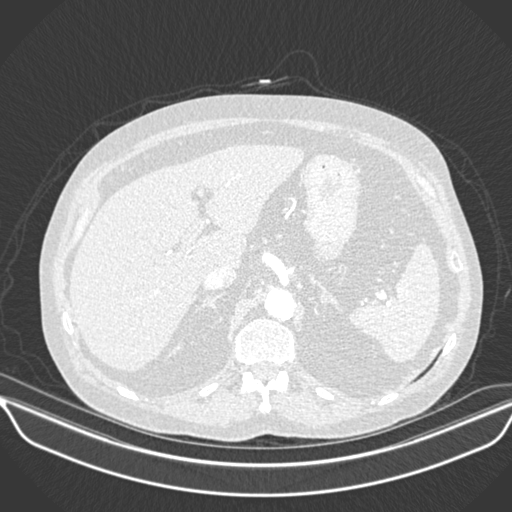
[im 602/709  soft-tissue]
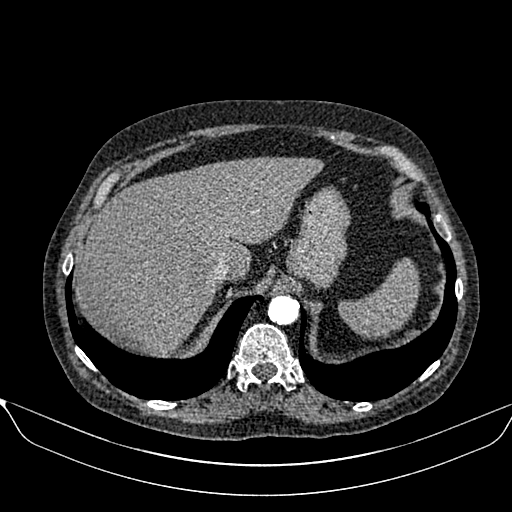
[im 602/709  lung]
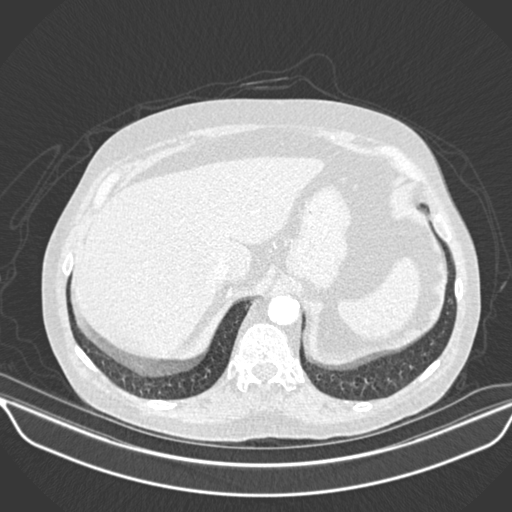
[im 638/709  lung]
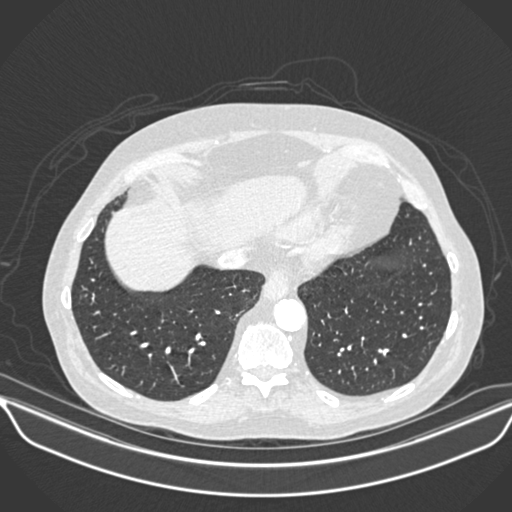
[im 673/709  soft-tissue]
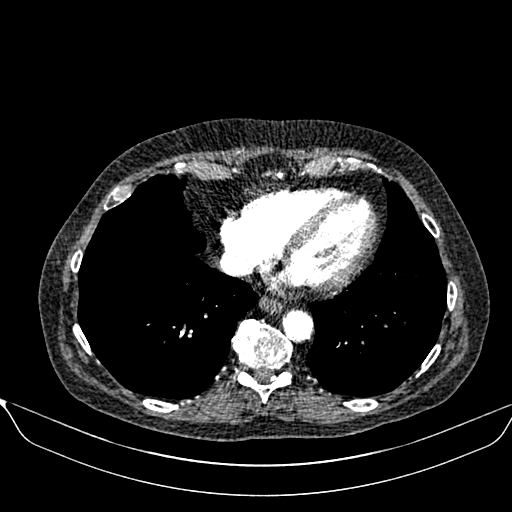
[im 673/709  lung]
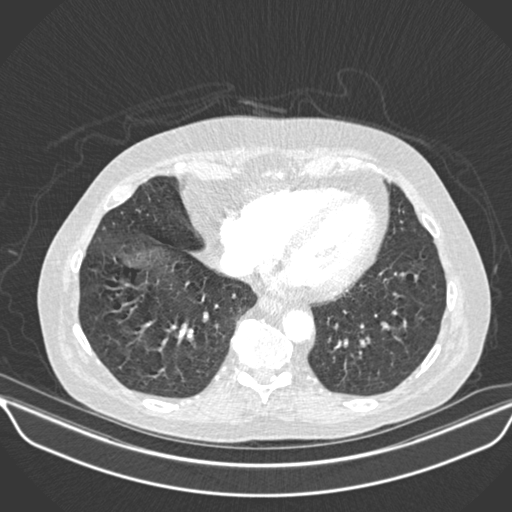

[13 of 32 positions shown; findings below may reference images not displayed]

FINDINGS: VASCULAR

Aorta: There is a penetrating atherosclerotic ulcer associated with
a saccular aneurysm of the infrarenal abdominal aorta with a maximal
transverse diameter of 3.2 cm. There is no stranding about the aorta
to suggest rupture or leaking. Scattered atherosclerotic
calcifications and some soft plaque are noted in the abdominal
aorta.

Celiac: There is mild narrowing at the origin. Branch vessels are
patent.

SMA: Atherosclerotic calcifications and smooth soft plaque at the
origin without significant focal narrowing. Branch vessels grossly
patent.

Renals: There are atherosclerotic calcifications at the origins of
the single renal arteries without significant focal narrowing.

IMA: Grossly patent

Inflow: There is a small penetrating atherosclerotic ulcer and
saccular aneurysm at the origin of the right common iliac artery
with a maximal diameter of 1.6 cm. In the distal right common iliac
artery, there is focal aneurysmal dilatation measuring up to 2.2 cm.
Chronic mural thrombus is present in the aneurysmal segment.
Internal and external iliac arteries are patent with atherosclerotic
calcifications.

The left common iliac artery is nonaneurysmal and patent. Left
internal and external iliac arteries are patent with atherosclerotic
calcifications.

Proximal Outflow: Grossly patent. Atherosclerotic calcifications are
present.

Veins: No obvious DVT.

Review of the MIP images confirms the above findings.

NON-VASCULAR

Lower chest: No acute abnormality.

Hepatobiliary: Unremarkable

Pancreas: Unremarkable

Spleen: Unremarkable

Adrenals/Urinary Tract: Kidneys and adrenal glands are within normal
limits. Bladder is decompressed.

Stomach/Bowel: Stomach is unremarkable. Duodenal diverticulum.
Appendix is unremarkable. No obvious mass in the colon. No evidence
of small-bowel obstruction.

Lymphatic: No abnormal retroperitoneal adenopathy.

Reproductive: Prostate is within normal limits.

Other: No free fluid.

Musculoskeletal: No vertebral compression deformity.
IMPRESSION: VASCULAR

Penetrating atherosclerotic ulcer and saccular aneurysm in the
infrarenal aorta results in a maximal diameter of 3.2 cm. Recommend
followup by ultrasound in 3 years. This recommendation follows ACR
consensus guidelines: White Paper of the ACR Incidental Findings

Aneurysmal dilatation of the right common iliac artery results in a
maximal diameter of 2.2 cm.

NON-VASCULAR

No acute findings.

## 2019-09-04 ENCOUNTER — Other Ambulatory Visit: Payer: Self-pay | Admitting: Cardiovascular Disease

## 2019-09-04 NOTE — Telephone Encounter (Signed)
*  STAT* If patient is at the pharmacy, call can be transferred to refill team.   1. Which medications need to be refilled? (please list name of each medication and dose if known) amLODipine (NORVASC) 2.5 MG tablet  2. Which pharmacy/location (including street and city if local pharmacy) is medication to be sent to? [facility] - [place], [place] - [place] [address]  3. Do they need a 30 day or 90 day supply? 90 day  Patient will be out of medication tomorrow

## 2019-09-05 MED ORDER — AMLODIPINE BESYLATE 2.5 MG PO TABS: 2.5000 mg | ORAL_TABLET | Freq: Every day | ORAL | 2 refills | Status: DC

## 2019-09-05 NOTE — Telephone Encounter (Signed)
Rx has been sent to the pharmacy electronically. ° °

## 2019-09-22 DIAGNOSIS — I2510 Atherosclerotic heart disease of native coronary artery without angina pectoris: Secondary | ICD-10-CM | POA: Diagnosis not present

## 2019-09-22 DIAGNOSIS — E7800 Pure hypercholesterolemia, unspecified: Secondary | ICD-10-CM | POA: Diagnosis not present

## 2019-09-22 DIAGNOSIS — I10 Essential (primary) hypertension: Secondary | ICD-10-CM | POA: Diagnosis not present

## 2019-09-22 DIAGNOSIS — N400 Enlarged prostate without lower urinary tract symptoms: Secondary | ICD-10-CM | POA: Diagnosis not present

## 2019-09-22 DIAGNOSIS — D649 Anemia, unspecified: Secondary | ICD-10-CM | POA: Diagnosis not present

## 2019-09-22 DIAGNOSIS — M1712 Unilateral primary osteoarthritis, left knee: Secondary | ICD-10-CM | POA: Diagnosis not present

## 2019-09-26 DIAGNOSIS — R945 Abnormal results of liver function studies: Secondary | ICD-10-CM | POA: Diagnosis not present

## 2019-10-30 ENCOUNTER — Ambulatory Visit: Payer: PPO | Attending: Internal Medicine

## 2019-10-30 DIAGNOSIS — Z23 Encounter for immunization: Secondary | ICD-10-CM | POA: Insufficient documentation

## 2019-10-30 NOTE — Progress Notes (Signed)
   Covid-19 Vaccination Clinic  Name:  Corey Garcia    MRN: [id number] DOB: 04-02-1946  10/30/2019  Mr. [name] was observed post Covid-19 immunization for 15 minutes without incidence. He was provided with Vaccine Information Sheet and instruction to access the V-Safe system.   Mr. [name] was instructed to call 911 with any severe reactions post vaccine: Marland Kitchen Difficulty breathing  . Swelling of your face and throat  . A fast heartbeat  . A bad rash all over your body  . Dizziness and weakness    Immunizations Administered    Name Date Dose VIS Date Route   Pfizer COVID-19 Vaccine 10/30/2019 10:40 AM 0.3 mL 08/11/2019 Intramuscular   Manufacturer: [place]   Lot: [id number]   [place]: [id number]

## 2019-11-28 ENCOUNTER — Ambulatory Visit: Payer: PPO | Attending: Internal Medicine

## 2019-11-28 DIAGNOSIS — Z23 Encounter for immunization: Secondary | ICD-10-CM

## 2019-11-28 NOTE — Progress Notes (Signed)
   Covid-19 Vaccination Clinic  Name:  Jameis Koltes    MRN: [id number] DOB: 1945-10-07  11/28/2019  Mr. Lisiecki was observed post Covid-19 immunization for 15 minutes without incident. He was provided with Vaccine Information Sheet and instruction to access the V-Safe system.   Mr. Pepitone was instructed to call 911 with any severe reactions post vaccine: Marland Kitchen Difficulty breathing  . Swelling of face and throat  . A fast heartbeat  . A bad rash all over body  . Dizziness and weakness   Immunizations Administered    Name Date Dose VIS Date Route   Pfizer COVID-19 Vaccine 11/28/2019 11:07 AM 0.3 mL 08/11/2019 Intramuscular   Manufacturer: [place]   Lot: [id number]   [place]: [id number]

## 2019-11-30 DIAGNOSIS — D649 Anemia, unspecified: Secondary | ICD-10-CM | POA: Diagnosis not present

## 2019-11-30 DIAGNOSIS — M1712 Unilateral primary osteoarthritis, left knee: Secondary | ICD-10-CM | POA: Diagnosis not present

## 2019-11-30 DIAGNOSIS — N400 Enlarged prostate without lower urinary tract symptoms: Secondary | ICD-10-CM | POA: Diagnosis not present

## 2019-11-30 DIAGNOSIS — E7800 Pure hypercholesterolemia, unspecified: Secondary | ICD-10-CM | POA: Diagnosis not present

## 2019-11-30 DIAGNOSIS — I10 Essential (primary) hypertension: Secondary | ICD-10-CM | POA: Diagnosis not present

## 2019-11-30 DIAGNOSIS — I2510 Atherosclerotic heart disease of native coronary artery without angina pectoris: Secondary | ICD-10-CM | POA: Diagnosis not present

## 2019-12-21 ENCOUNTER — Other Ambulatory Visit: Payer: Self-pay | Admitting: *Deleted

## 2019-12-21 DIAGNOSIS — I714 Abdominal aortic aneurysm, without rupture, unspecified: Secondary | ICD-10-CM

## 2019-12-27 ENCOUNTER — Ambulatory Visit (HOSPITAL_COMMUNITY)
Admission: RE | Admit: 2019-12-27 | Discharge: 2019-12-27 | Disposition: A | Discharge status: Home / Self Care | Payer: PPO | Source: Ambulatory Visit | Attending: Vascular Surgery | Admitting: Vascular Surgery

## 2019-12-27 ENCOUNTER — Ambulatory Visit: Payer: PPO | Admitting: Vascular Surgery

## 2019-12-27 ENCOUNTER — Other Ambulatory Visit: Payer: Self-pay

## 2019-12-27 DIAGNOSIS — I714 Abdominal aortic aneurysm, without rupture, unspecified: Secondary | ICD-10-CM

## 2019-12-29 DIAGNOSIS — M545 Low back pain: Secondary | ICD-10-CM | POA: Diagnosis not present

## 2019-12-29 DIAGNOSIS — M25551 Pain in right hip: Secondary | ICD-10-CM | POA: Diagnosis not present

## 2020-01-10 DIAGNOSIS — M1611 Unilateral primary osteoarthritis, right hip: Secondary | ICD-10-CM | POA: Diagnosis not present

## 2020-01-17 ENCOUNTER — Other Ambulatory Visit: Payer: Self-pay

## 2020-01-17 ENCOUNTER — Ambulatory Visit: Payer: PPO | Admitting: Vascular Surgery

## 2020-01-17 ENCOUNTER — Encounter: Payer: Self-pay | Admitting: Vascular Surgery

## 2020-01-17 VITALS — BP 170/77 | HR 60 | Temp 98.0°F | Resp 20 | Ht 64.0 in | Wt 147.0 lb

## 2020-01-17 DIAGNOSIS — I714 Abdominal aortic aneurysm, without rupture, unspecified: Secondary | ICD-10-CM

## 2020-01-17 DIAGNOSIS — Z48812 Encounter for surgical aftercare following surgery on the circulatory system: Secondary | ICD-10-CM

## 2020-01-17 NOTE — Progress Notes (Signed)
REASON FOR VISIT:   Follow-up after endovascular aneurysm repair  MEDICAL ISSUES:   S/P ENDOVASCULAR ANEURYSM REPAIR:  The patient is doing well status post endovascular repair of a saccular aneurysm.  The aneurysm has shrunk down to 2.8 cm in maximum diameter.  I think we can simply follow this with ultrasound.  I ordered an ultrasound in 1 year and I will see him back at that time.  He knows to call sooner if he has problems.  He is not a smoker.  His systolic blood pressure was slightly elevated today however he took his medication late this morning.  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   HPI:   Corey Garcia is a pleasant 74 y.o. male who underwent repair of a 3.2 cm saccular aneurysm on 01/13/2018.  At the time of his most recent follow-up visit on 03/08/2019 the aneurysm had decreased in size to 2.8 cm.  Of note we could not visualize the common iliac arteries at the time of his last duplex.  I ordered a follow-up ultrasound in 9 months with the thought that if we could not see his iliac arteries we would have to get a CT scan.  Since I saw him last his only complaint has been some back pain.  He is he has had chronic back issues.  He had some pain that radiated down her right leg.  The pain occurs at rest.  I do not get any history of claudication or rest pain.  He did get an injection in his back which did help his symptoms.  He is not a smoker.  He is on aspirin and is on a statin.  Past Medical History:  Diagnosis Date  . Alcoholism ([place]) 03/15/2014  . Anemia   . Arthritis    all over, some ra  . Bradycardia by electrocardiogram    BB decreased 11/12/11  . CAD (coronary artery disease)    last cath=01/2008, PCI  . CHF (congestive heart failure) ([place])   . Dyslipidemia   . ED (erectile dysfunction)    viagra helps  . GERD (gastroesophageal reflux disease)   . HTN (hypertension)   . Iron overload 03/15/2014  . 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 Murmur, cardiac    faint early systolic grade 1/6 aortic ejection murmur  . Myocardial infarction ([place])   . Peripheral vascular disease ([place])   . Rib pain 04/16/2014  . Skin cancer    basal cell ca X 4    Family History  Problem Relation Age of Onset  . Heart failure Mother        heart disease  . Heart disease Mother   . Heart failure Father        heart disease  . Heart disease Father   . Heart disease Brother   . Heart failure Brother        "bad heart valve"    SOCIAL HISTORY: Social History   Tobacco Use  . Smoking status: Former Smoker    Quit date: 10/31/1982    Years since quitting: 37.2  . Smokeless tobacco: Never Used  Substance Use Topics  . Alcohol use: Yes    Alcohol/week: 28.0 standard drinks    Types: 28 Cans of beer per week    Comment: "3-4 beers or hard apple ciders/ day" 01/10/2018    Allergies  Allergen Reactions  . Lisinopril  Other (See Comments)    PHOTOSENSITIVITY ON ARMS  . Minocin [Minocycline Hcl]     Flank pain  . Sulfa Antibiotics     Drop in BP  . Thiazide-Type Diuretics Other (See Comments)    "Lowered my blood pressure"  . Codeine Nausea Only    Current Outpatient Medications  Medication Sig Dispense Refill  . amLODipine (NORVASC) 2.5 MG tablet Take 1 tablet (2.5 mg total) by mouth daily. 90 tablet 2  . aspirin EC 81 MG tablet Take 1 tablet (81 mg total) by mouth daily. 60 tablet 0  . atorvastatin (LIPITOR) 20 MG tablet Take 20 mg by mouth daily.    . bisoprolol (ZEBETA) 10 MG tablet Take 15 mg by mouth daily.     . cholecalciferol (VITAMIN D) 1000 units tablet Take 1,000 Units by mouth daily.    Mariane Baumgarten Sodium 100 MG capsule     . hydrocortisone (PROCTO-MED HC) 2.5 % rectal cream Place 1 application rectally as needed for hemorrhoids or itching.    . loratadine (CLARITIN) 10 MG tablet Take 10 mg by mouth daily.     . pantoprazole (PROTONIX) 40 MG tablet Take 40 mg by mouth daily.       . Probiotic Product (PROBIOTIC PO) Take 1 tablet by mouth daily.    . sildenafil (VIAGRA) 100 MG tablet     . tacrolimus (PROTOPIC) 0.03 % ointment Apply 1 application topically daily as needed (dermatitis on face).     . tamsulosin (FLOMAX) 0.4 MG CAPS capsule Take 0.4 mg by mouth daily.     No current facility-administered medications for this visit.    REVIEW OF SYSTEMS:  [X]  denotes positive finding, [ ]  denotes negative finding Cardiac  Comments:  Chest pain or chest pressure:    Shortness of breath upon exertion:    Short of breath when lying flat:    Irregular heart rhythm:        Vascular    Pain in calf, thigh, or hip brought on by ambulation: x   Pain in feet at night that wakes you up from your sleep:     Blood clot in your veins:    Leg swelling:         Pulmonary    Oxygen at home:    Productive cough:     Wheezing:         Neurologic    Sudden weakness in arms or legs:     Sudden numbness in arms or legs:     Sudden onset of difficulty speaking or slurred speech:    Temporary loss of vision in one eye:     Problems with dizziness:         Gastrointestinal    Blood in stool:     Vomited blood:         Genitourinary    Burning when urinating:     Blood in urine:        Psychiatric    Major depression:         Hematologic    Bleeding problems:    Problems with blood clotting too easily:        Skin    Rashes or ulcers:        Constitutional    Fever or chills:     PHYSICAL EXAM:   Vitals:   01/17/20 0852  BP: (!) 170/77  Pulse: 60  Resp: 20  Temp: 98 F (36.7 C)  SpO2: 96%  Weight: 147 lb (66.7 kg)  Height: 5\' 4"  (1.626 m)    GENERAL: The patient is a well-nourished male, in no acute distress. The vital signs are documented above. CARDIAC: There is a regular rate and rhythm.  He has a systolic murmur. VASCULAR: I do not detect carotid bruits. I cannot palpate pedal pulses however both feet are warm and well perfused. PULMONARY:  There is good air exchange bilaterally without wheezing or rales. ABDOMEN: Soft and non-tender with normal pitched bowel sounds.  MUSCULOSKELETAL: There are no major deformities or cyanosis. NEUROLOGIC: No focal weakness or paresthesias are detected. SKIN: There are no ulcers or rashes noted. PSYCHIATRIC: The patient has a normal affect.  DATA:    DUPLEX ABDOMINAL AORTA: I reviewed the duplex of his abdominal aorta which shows no evidence of endoleak.  The maximum diameter of his aneurysm was 2.8 cm.  The right common iliac artery measures 1.0 cm in maximum diameter.  The left common iliac artery measures 0.96 cm in diameter.  Corey Garcia [facility] [phone]

## 2020-02-21 DIAGNOSIS — D649 Anemia, unspecified: Secondary | ICD-10-CM | POA: Diagnosis not present

## 2020-02-21 DIAGNOSIS — E7800 Pure hypercholesterolemia, unspecified: Secondary | ICD-10-CM | POA: Diagnosis not present

## 2020-02-21 DIAGNOSIS — I2510 Atherosclerotic heart disease of native coronary artery without angina pectoris: Secondary | ICD-10-CM | POA: Diagnosis not present

## 2020-02-21 DIAGNOSIS — M1712 Unilateral primary osteoarthritis, left knee: Secondary | ICD-10-CM | POA: Diagnosis not present

## 2020-02-21 DIAGNOSIS — N400 Enlarged prostate without lower urinary tract symptoms: Secondary | ICD-10-CM | POA: Diagnosis not present

## 2020-02-21 DIAGNOSIS — I10 Essential (primary) hypertension: Secondary | ICD-10-CM | POA: Diagnosis not present

## 2020-03-14 DIAGNOSIS — D649 Anemia, unspecified: Secondary | ICD-10-CM | POA: Diagnosis not present

## 2020-03-14 DIAGNOSIS — I10 Essential (primary) hypertension: Secondary | ICD-10-CM | POA: Diagnosis not present

## 2020-03-14 DIAGNOSIS — E7800 Pure hypercholesterolemia, unspecified: Secondary | ICD-10-CM | POA: Diagnosis not present

## 2020-03-14 DIAGNOSIS — N400 Enlarged prostate without lower urinary tract symptoms: Secondary | ICD-10-CM | POA: Diagnosis not present

## 2020-03-14 DIAGNOSIS — M1712 Unilateral primary osteoarthritis, left knee: Secondary | ICD-10-CM | POA: Diagnosis not present

## 2020-03-14 DIAGNOSIS — I2510 Atherosclerotic heart disease of native coronary artery without angina pectoris: Secondary | ICD-10-CM | POA: Diagnosis not present

## 2020-04-23 DIAGNOSIS — B309 Viral conjunctivitis, unspecified: Secondary | ICD-10-CM | POA: Diagnosis not present

## 2020-05-17 DIAGNOSIS — K1379 Other lesions of oral mucosa: Secondary | ICD-10-CM | POA: Diagnosis not present

## 2020-05-28 DIAGNOSIS — I10 Essential (primary) hypertension: Secondary | ICD-10-CM | POA: Diagnosis not present

## 2020-05-28 DIAGNOSIS — I2510 Atherosclerotic heart disease of native coronary artery without angina pectoris: Secondary | ICD-10-CM | POA: Diagnosis not present

## 2020-05-28 DIAGNOSIS — D649 Anemia, unspecified: Secondary | ICD-10-CM | POA: Diagnosis not present

## 2020-05-28 DIAGNOSIS — N400 Enlarged prostate without lower urinary tract symptoms: Secondary | ICD-10-CM | POA: Diagnosis not present

## 2020-05-28 DIAGNOSIS — E7800 Pure hypercholesterolemia, unspecified: Secondary | ICD-10-CM | POA: Diagnosis not present

## 2020-05-28 DIAGNOSIS — M1712 Unilateral primary osteoarthritis, left knee: Secondary | ICD-10-CM | POA: Diagnosis not present

## 2020-05-31 ENCOUNTER — Other Ambulatory Visit: Payer: Self-pay

## 2020-05-31 MED ORDER — AMLODIPINE BESYLATE 2.5 MG PO TABS: 2.5000 mg | ORAL_TABLET | Freq: Every day | ORAL | 0 refills | Status: DC

## 2020-06-03 DIAGNOSIS — Z20822 Contact with and (suspected) exposure to covid-19: Secondary | ICD-10-CM | POA: Diagnosis not present

## 2020-06-15 ENCOUNTER — Ambulatory Visit: Payer: PPO | Attending: Internal Medicine

## 2020-06-15 DIAGNOSIS — Z23 Encounter for immunization: Secondary | ICD-10-CM

## 2020-06-15 NOTE — Progress Notes (Signed)
   Covid-19 Vaccination Clinic  Name:  Terran Klinke    MRN: [id number] DOB: 02-22-1946  06/15/2020  Mr. Walraven was observed post Covid-19 immunization for 15 minutes without incident. He was provided with Vaccine Information Sheet and instruction to access the V-Safe system.   Mr. Meddaugh was instructed to call 911 with any severe reactions post vaccine: Marland Kitchen Difficulty breathing  . Swelling of face and throat  . A fast heartbeat  . A bad rash all over body  . Dizziness and weakness

## 2020-07-02 ENCOUNTER — Ambulatory Visit: Payer: PPO | Admitting: Dermatology

## 2020-07-04 DIAGNOSIS — Z23 Encounter for immunization: Secondary | ICD-10-CM | POA: Diagnosis not present

## 2020-07-04 DIAGNOSIS — Z1389 Encounter for screening for other disorder: Secondary | ICD-10-CM | POA: Diagnosis not present

## 2020-07-04 DIAGNOSIS — Z0000 Encounter for general adult medical examination without abnormal findings: Secondary | ICD-10-CM | POA: Diagnosis not present

## 2020-07-18 ENCOUNTER — Ambulatory Visit: Payer: PPO | Admitting: Cardiovascular Disease

## 2020-07-18 ENCOUNTER — Other Ambulatory Visit: Payer: Self-pay

## 2020-07-18 ENCOUNTER — Encounter: Payer: Self-pay | Admitting: Cardiovascular Disease

## 2020-07-18 ENCOUNTER — Other Ambulatory Visit: Payer: Self-pay | Admitting: Cardiovascular Disease

## 2020-07-18 VITALS — BP 116/62 | HR 75 | Ht 64.0 in | Wt 146.0 lb

## 2020-07-18 DIAGNOSIS — E785 Hyperlipidemia, unspecified: Secondary | ICD-10-CM | POA: Diagnosis not present

## 2020-07-18 DIAGNOSIS — E7800 Pure hypercholesterolemia, unspecified: Secondary | ICD-10-CM | POA: Diagnosis not present

## 2020-07-18 DIAGNOSIS — I4891 Unspecified atrial fibrillation: Secondary | ICD-10-CM

## 2020-07-18 DIAGNOSIS — I10 Essential (primary) hypertension: Secondary | ICD-10-CM | POA: Diagnosis not present

## 2020-07-18 DIAGNOSIS — I350 Nonrheumatic aortic (valve) stenosis: Secondary | ICD-10-CM

## 2020-07-18 DIAGNOSIS — I25118 Atherosclerotic heart disease of native coronary artery with other forms of angina pectoris: Secondary | ICD-10-CM | POA: Diagnosis not present

## 2020-07-18 DIAGNOSIS — Z8679 Personal history of other diseases of the circulatory system: Secondary | ICD-10-CM | POA: Diagnosis not present

## 2020-07-18 DIAGNOSIS — Z9889 Other specified postprocedural states: Secondary | ICD-10-CM | POA: Diagnosis not present

## 2020-07-18 LAB — COMPREHENSIVE METABOLIC PANEL
ALT: 28 IU/L (ref 0–44)
AST: 43 IU/L — ABNORMAL HIGH (ref 0–40)
Albumin/Globulin Ratio: 1.5 (ref 1.2–2.2)
Albumin: 4.3 g/dL (ref 3.7–4.7)
Alkaline Phosphatase: 72 IU/L (ref 44–121)
BUN/Creatinine Ratio: 14 (ref 10–24)
BUN: 13 mg/dL (ref 8–27)
Bilirubin Total: 0.5 mg/dL (ref 0.0–1.2)
CO2: 25 mmol/L (ref 20–29)
Calcium: 9 mg/dL (ref 8.6–10.2)
Chloride: 95 mmol/L — ABNORMAL LOW (ref 96–106)
Creatinine, Ser: 0.95 mg/dL (ref 0.76–1.27)
GFR calc Af Amer: 91 mL/min/{1.73_m2} (ref >59)
GFR calc non Af Amer: 79 mL/min/{1.73_m2} (ref >59)
Globulin, Total: 2.8 g/dL (ref 1.5–4.5)
Glucose: 96 mg/dL (ref 65–99)
Potassium: 4.2 mmol/L (ref 3.5–5.2)
Sodium: 133 mmol/L — ABNORMAL LOW (ref 134–144)
Total Protein: 7.1 g/dL (ref 6.0–8.5)

## 2020-07-18 LAB — CBC
Hematocrit: 35.9 % — ABNORMAL LOW (ref 37.5–51.0)
Hemoglobin: 12.4 g/dL — ABNORMAL LOW (ref 13.0–17.7)
MCH: 33.1 pg — ABNORMAL HIGH (ref 26.6–33.0)
MCHC: 34.5 g/dL (ref 31.5–35.7)
MCV: 96 fL (ref 79–97)
Platelets: 185 10*3/uL (ref 150–450)
RBC: 3.75 x10E6/uL — ABNORMAL LOW (ref 4.14–5.80)
RDW: 11.6 % (ref 11.6–15.4)
WBC: 4.8 10*3/uL (ref 3.4–10.8)

## 2020-07-18 LAB — LIPID PANEL
Chol/HDL Ratio: 2.1 ratio (ref 0.0–5.0)
Cholesterol, Total: 142 mg/dL (ref 100–199)
HDL: 67 mg/dL (ref >39)
LDL Chol Calc (NIH): 62 mg/dL (ref 0–99)
Triglycerides: 66 mg/dL (ref 0–149)
VLDL Cholesterol Cal: 13 mg/dL (ref 5–40)

## 2020-07-18 MED ORDER — APIXABAN 5 MG PO TABS: 5.0000 mg | ORAL_TABLET | Freq: Two times a day (BID) | ORAL | 5 refills | Status: DC

## 2020-07-18 NOTE — Telephone Encounter (Signed)
Medication refilled during OV today 07/18/20

## 2020-07-18 NOTE — Progress Notes (Signed)
Cardiology Office Note   Date:  07/19/2020   ID:  [name], [name] 1945-10-09, MRN [id number]  PCP:  [name], MD  Cardiologist:  Dr.Amyjo Mizrachi   Chief Complaint  Patient presents with  . Coronary Artery Disease  . PAD     History of Present Illness: Corey Garcia is a 74 y.o. male with CAD, vasovagal syncope, AAA s/p EVAR (Dr. Scot Dock), hypertension, hyperlipidemia, chronic liver disease. He has not required coronary intervention since 2009 when he had cutting balloon atherectomy of the OM2 and DES to the LAD, residual 50% RCA stenosis. He had a low risk nuclear study April 2014.  His EVAR ultrasound follow-up in April 2021 showed favorable findings.  He came in today for a routine follow-up and is completely asymptomatic.  He specifically denies any palpitations.  On physical exam his rhythm was regular and his ECG shows atrial fibrillation with controlled ventricular rate.  This is a new rhythm problem for him.  He does have a history of bradycardia in the past, but is currently taking 15 mg of bisoprolol daily without problem.  The patient specifically denies any chest pain at rest exertion, dyspnea at rest or with exertion, orthopnea, paroxysmal nocturnal dyspnea, syncope, palpitations, focal neurological deficits, intermittent claudication, lower extremity edema, unexplained weight gain, cough, hemoptysis or wheezing.  He denies falls, injuries or bleeding problems    Past Medical History:  Diagnosis Date  . Alcoholism ([place]) 03/15/2014  . Anemia   . Arthritis    all over, some ra  . Bradycardia by electrocardiogram    BB decreased 11/12/11  . CAD (coronary artery disease)    last cath=01/2008, PCI  . CHF (congestive heart failure) ([place])   . Dyslipidemia   . ED (erectile dysfunction)    viagra helps  . GERD (gastroesophageal reflux disease)   . HTN (hypertension)   . Iron overload 03/15/2014  . Liver disease due to alcohol ([place]) 03/15/2014   pt .denies at  preop  . Murmur, cardiac    faint early systolic grade 1/6 aortic ejection murmur  . Myocardial infarction ([place])   . Peripheral vascular disease ([place])   . Rib pain 04/16/2014  . Skin cancer    basal cell ca X 4    Past Surgical History:  Procedure Laterality Date  . ABDOMINAL AORTIC ENDOVASCULAR STENT GRAFT N/A 01/13/2018   Procedure: ABDOMINAL AORTIC ENDOVASCULAR STENT GRAFT;  Surgeon: Angelia Mould, MD;  Location: [facility] OR;  Service: Vascular;  Laterality: N/A;  . CORONARY ANGIOPLASTY WITH STENT PLACEMENT  01/31/2008   PCI with cutting balloon arthrectomy of the ostium of the circ obtuse marg 2 vessel  . CORONARY ANGIOPLASTY WITH STENT PLACEMENT  01/27/08   nl LV function, PTCA/stenting of LAD with 3x61mm Cypher post dilated 3.44mm  . EYE SURGERY     cataract bil  . FRACTURE SURGERY     right elbow when 74 years old  . HERNIA REPAIR    . KNEE ARTHROSCOPY Left   . MOHS SURGERY     x4  . NASAL SEPTUM SURGERY     x2  . TOTAL KNEE ARTHROPLASTY Left 11/08/2018   Procedure: TOTAL KNEE ARTHROPLASTY;  Surgeon: Renette Butters, MD;  Location: [facility] ORS;  Service: Orthopedics;  Laterality: Left;     Current Outpatient Medications  Medication Sig Dispense Refill  . amLODipine (NORVASC) 2.5 MG tablet Take 1 tablet (2.5 mg total) by mouth daily. 90 tablet 0  . atorvastatin (LIPITOR) 20 MG  tablet Take 20 mg by mouth daily.    . bisoprolol (ZEBETA) 10 MG tablet Take 15 mg by mouth daily.     . cholecalciferol (VITAMIN D) 1000 units tablet Take 1,000 Units by mouth daily.    Mariane Baumgarten Sodium 100 MG capsule     . hydrocortisone (PROCTO-MED HC) 2.5 % rectal cream Place 1 application rectally as needed for hemorrhoids or itching.    . loratadine (CLARITIN) 10 MG tablet Take 10 mg by mouth daily.     . pantoprazole (PROTONIX) 40 MG tablet Take 40 mg by mouth daily.     . Probiotic Product (PROBIOTIC PO) Take 1 tablet by mouth daily.    . sildenafil (VIAGRA) 100 MG tablet     . tacrolimus  (PROTOPIC) 0.03 % ointment Apply 1 application topically daily as needed (dermatitis on face).     . tamsulosin (FLOMAX) 0.4 MG CAPS capsule Take 0.4 mg by mouth daily.    . AMOXICILLIN PO Take by mouth. Take prior to dental work. (Patient not taking: Reported on 07/18/2020)    . apixaban (ELIQUIS) 5 MG TABS tablet Take 1 tablet (5 mg total) by mouth 2 (two) times daily. 60 tablet 5  . indomethacin (INDOCIN) 50 MG capsule Take 50 mg by mouth 3 (three) times daily as needed. (Patient not taking: Reported on 07/18/2020)     No current facility-administered medications for this visit.    Allergies:   Lisinopril, Minocin [minocycline hcl], Sulfa antibiotics, Thiazide-type diuretics, and Codeine    Social History:  The patient  reports that he quit smoking about 37 years ago. He has never used smokeless tobacco. He reports current alcohol use of about 28.0 standard drinks of alcohol per week. He reports that he does not use drugs.   Family History:  The patient's family history includes Heart disease in his brother, father, and mother; Heart failure in his brother, father, and mother.    ROS: Please see history of present illness. All other systems are reviewed and are negative.  PHYSICAL EXAM: VS:  BP 116/62   Pulse 75   Ht 5\' 4"  (1.626 m)   Wt 146 lb (66.2 kg)   SpO2 99%   BMI 25.06 kg/m  , BMI Body mass index is 25.06 kg/m.   General: Alert, oriented x3, no distress, appears well Head: no evidence of trauma, PERRL, EOMI, no exophtalmos or lid lag, no myxedema, no xanthelasma; normal ears, nose and oropharynx Neck: normal jugular venous pulsations and no hepatojugular reflux; brisk carotid pulses without delay and no carotid bruits Chest: clear to auscultation, no signs of consolidation by percussion or palpation, normal fremitus, symmetrical and full respiratory excursions Cardiovascular: normal position and quality of the apical impulse, irregular rhythm, normal first and distinct  second heart sounds, 2/6 aortic ejection murmur is early peaking, no diastolic murmurs, rubs or gallops Abdomen: no tenderness or distention, no masses by palpation, no abnormal pulsatility or arterial bruits, normal bowel sounds, no hepatosplenomegaly Extremities: no clubbing, cyanosis or edema; 2+ radial, ulnar and brachial pulses bilaterally; 2+ right femoral, posterior tibial and dorsalis pedis pulses; 2+ left femoral, posterior tibial and dorsalis pedis pulses; no subclavian or femoral bruits Neurological: grossly nonfocal Psych: Normal mood and affect  AAA Duplex April 2021: Summary: Abdominal Aorta: Patent endovascular aneurysm repair with no evidence of endoleak. The largest aortic diameter remains essentially unchanged compared to prior exam. Previous diameter measurement was 2.8 cm obtained on 03/08/2019.  ECHO 07/19/2019:  1. Left ventricular  ejection fraction, by visual estimation, is 60 to  65%. The left ventricle has normal function. There is no left ventricular  hypertrophy.  2. Left ventricular diastolic parameters are consistent with Grade II  diastolic dysfunction (pseudonormalization).  3. Global right ventricle has normal systolic function.The right  ventricular size is normal. No increase in right ventricular wall  thickness.  4. Left atrial size was mildly dilated.  5. Right atrial size was normal.  6. Mild mitral annular calcification.  7. The mitral valve is normal in structure. No evidence of mitral valve  regurgitation. No evidence of mitral stenosis.  8. The tricuspid valve is normal in structure. Tricuspid valve  regurgitation is not demonstrated.  9. The aortic valve was not well visualized. Aortic valve regurgitation  is trivial. Mild to moderate aortic valve stenosis.  10. The pulmonic valve was normal in structure. Pulmonic valve  regurgitation is not visualized.  11. Normal pulmonary artery systolic pressure.  12. The inferior vena cava is  normal in size with greater than 50%  respiratory variability, suggesting right atrial pressure of 3 mmHg.    CT Angio 09/20/2018 1. Slight decrease in aneurysm sac size at the level of isolated saccular infrarenal aortic aneurysm with maximal aortic diameter now measuring 3.2 cm. 2. Persistent endoleak in the inferior and anterior aspect of the aneurysm sac, likely a type II endoleak. Most likely inflow is felt to be from retrograde flow in the inferior mesenteric artery given location of the sac perfusion on the arterial phase. There are some small visualized lumbar arteries as well which could be acting as inflow or outflow but are not in close proximity to the area contrast opacification. Although persistent, this endoleak is not resulting in any increase in aneurysm sac size. 3. Stable aneurysmal disease at the origin of the right common iliac artery measuring 1.7 cm and in the mid to distal right common iliac artery measuring 2.5 cm.  EKG: ordered today, shows atrial fibrillation with controlled rate, poor R wave progression with RS transition in lead V5, no ischemic repolarization changes, QTC 406 ms   Recent Labs: 07/18/2020: ALT 28; BUN 13; Creatinine, Ser 0.95; Hemoglobin 12.4; Platelets 185; Potassium 4.2; Sodium 133  June 16, 2018 Dr. Drema Dallas: Total cholesterol 142, HDL 48, LDL 76, triglycerides 90 Hemoglobin of 11.0, creatinine 0.96  June 23, 2019 Creatinine 0.95, potassium 4.3, hemoglobin 12.0  Lipid Panel    Component Value Date/Time   CHOL 142 07/18/2020 0930   TRIG 66 07/18/2020 0930   HDL 67 07/18/2020 0930   CHOLHDL 2.1 07/18/2020 0930   CHOLHDL 3.0 01/27/2008 0652   VLDL 10 01/27/2008 0652   LDLCALC 62 07/18/2020 0930      Wt Readings from Last 3 Encounters:  07/18/20 146 lb (66.2 kg)  01/17/20 147 lb (66.7 kg)  07/11/19 141 lb 6.4 oz (64.1 kg)     1. New onset atrial fibrillation ([place])   2. Coronary artery disease involving native coronary  artery of native heart with other form of angina pectoris ([place])   3. Pure hypercholesterolemia   4. Essential hypertension   5. Aortic valve stenosis, nonrheumatic   6. Status post endovascular aneurysm repair (EVAR)      ASSESSMENT AND PLAN:  1. Afib: At this point cannot tell whether this is paroxysmal or persistent.  He is completely asymptomatic and has excellent rate control on his current dose of bisoprolol.  CHA2DS2-VASc score is at least 3 (age, hypertension, CAD/PAD) .  Discussed the  rationale for chronic anticoagulation and prescribed Eliquis 5 mg twice daily to replace his aspirin.  Reviewed the relative balance of stroke prevention versus risk of bleeding complications.  We will bring him back in a few weeks to assess rhythm and make a recommendation regarding the need for cardioversion.  At this point antiarrhythmics are not indicated. 2. CAD: Angina free.  Last evaluation was a low risk nuclear stress test in 2014, no revascularization since 2009.  Normal left ventricular ejection fraction by echo in March 2018. 3. HLP: All lipid parameters are excellent on labs drawn today 4. HTN: Excellent control 5. AS: Asymptomatic, mild-moderate by echocardiogram November 2020.  He should call us if he develops exertional dyspnea/angina/syncope, but otherwise we will plan to reevaluate by echo in another year. 6. AAA s/p EVAR: reviewed CT angio results from January 2020 and [place] April 2021. Seeing Dr. Scot Dock.   Patient Instructions  Medication Instructions:  STOP the Aspirin START Eliquis 5 mg twice daily  *If you need a refill on your cardiac medications before your next appointment, please call your pharmacy*   Lab Work: Your provider would like for you to have the following labs today: fasting Lipid, CMET, and a CBC  If you have labs (blood work) drawn today and your tests are completely normal, you will receive your results only by: Marland Kitchen MyChart Message (if you have MyChart) OR . A  paper copy in the mail If you have any lab test that is abnormal or we need to change your treatment, we will call you to review the results.   Testing/Procedures: None ordered   Follow-Up: At [facility], you and your health needs are our priority.  As part of our continuing mission to provide you with exceptional heart care, we have created designated Provider Care Teams.  These Care Teams include your primary Cardiologist (physician) and Advanced Practice Providers (APPs -  Physician Assistants and Nurse Practitioners) who all work together to provide you with the care you need, when you need it.  We recommend signing up for the patient portal called "MyChart".  Sign up information is provided on this After Visit Summary.  MyChart is used to connect with patients for Virtual Visits (Telemedicine).  Patients are able to view lab/test results, encounter notes, upcoming appointments, etc.  Non-urgent messages can be sent to your provider as well.   To learn more about what you can do with MyChart, go to [email or web address].    Your next appointment:   1 month(s)  The format for your next appointment:   In Person  Provider:   You may see Sanda Klein, MD or one of the following Advanced Practice Providers on your designated Care Team:    Almyra Deforest, PA-C  Fabian Sharp, PA-C or   Roby Lofts, PA-C      Sanda Klein, MD, [facility] [phone] office [phone] pager

## 2020-07-18 NOTE — Telephone Encounter (Signed)
°*  STAT* If patient is at the pharmacy, call can be transferred to refill team.   1. Which medications need to be refilled? (please list name of each medication and dose if known)  apixaban (ELIQUIS) 5 MG TABS tablet  2. Which pharmacy/location (including street and city if local pharmacy) is medication to be sent to? [facility] [phone] - [place], [place]  3. Do they need a 30 day or 90 day supply? 90 day supply  Pt states he gets a better deal when it is a 90 day supply vs 30

## 2020-07-18 NOTE — Patient Instructions (Signed)
Medication Instructions:  STOP the Aspirin START Eliquis 5 mg twice daily  *If you need a refill on your cardiac medications before your next appointment, please call your pharmacy*   Lab Work: Your provider would like for you to have the following labs today: fasting Lipid, CMET, and a CBC  If you have labs (blood work) drawn today and your tests are completely normal, you will receive your results only by: Marland Kitchen MyChart Message (if you have MyChart) OR . A paper copy in the mail If you have any lab test that is abnormal or we need to change your treatment, we will call you to review the results.   Testing/Procedures: None ordered   Follow-Up: At [facility], you and your health needs are our priority.  As part of our continuing mission to provide you with exceptional heart care, we have created designated Provider Care Teams.  These Care Teams include your primary Cardiologist (physician) and Advanced Practice Providers (APPs -  Physician Assistants and Nurse Practitioners) who all work together to provide you with the care you need, when you need it.  We recommend signing up for the patient portal called "MyChart".  Sign up information is provided on this After Visit Summary.  MyChart is used to connect with patients for Virtual Visits (Telemedicine).  Patients are able to view lab/test results, encounter notes, upcoming appointments, etc.  Non-urgent messages can be sent to your provider as well.   To learn more about what you can do with MyChart, go to [email or web address].    Your next appointment:   1 month(s)  The format for your next appointment:   In Person  Provider:   You may see Sanda Klein, MD or one of the following Advanced Practice Providers on your designated Care Team:    Almyra Deforest, PA-C  Fabian Sharp, PA-C or   Roby Lofts, [place]

## 2020-07-19 ENCOUNTER — Encounter: Payer: Self-pay | Admitting: Cardiovascular Disease

## 2020-07-21 DIAGNOSIS — H109 Unspecified conjunctivitis: Secondary | ICD-10-CM | POA: Diagnosis not present

## 2020-07-22 DIAGNOSIS — I2510 Atherosclerotic heart disease of native coronary artery without angina pectoris: Secondary | ICD-10-CM | POA: Diagnosis not present

## 2020-07-22 DIAGNOSIS — D649 Anemia, unspecified: Secondary | ICD-10-CM | POA: Diagnosis not present

## 2020-07-22 DIAGNOSIS — K219 Gastro-esophageal reflux disease without esophagitis: Secondary | ICD-10-CM | POA: Diagnosis not present

## 2020-07-22 DIAGNOSIS — E7800 Pure hypercholesterolemia, unspecified: Secondary | ICD-10-CM | POA: Diagnosis not present

## 2020-07-22 DIAGNOSIS — I10 Essential (primary) hypertension: Secondary | ICD-10-CM | POA: Diagnosis not present

## 2020-07-22 DIAGNOSIS — M1712 Unilateral primary osteoarthritis, left knee: Secondary | ICD-10-CM | POA: Diagnosis not present

## 2020-07-22 DIAGNOSIS — N400 Enlarged prostate without lower urinary tract symptoms: Secondary | ICD-10-CM | POA: Diagnosis not present

## 2020-07-22 DIAGNOSIS — I4891 Unspecified atrial fibrillation: Secondary | ICD-10-CM | POA: Diagnosis not present

## 2020-08-07 DIAGNOSIS — D649 Anemia, unspecified: Secondary | ICD-10-CM | POA: Diagnosis not present

## 2020-08-07 DIAGNOSIS — E871 Hypo-osmolality and hyponatremia: Secondary | ICD-10-CM | POA: Diagnosis not present

## 2020-08-07 DIAGNOSIS — R809 Proteinuria, unspecified: Secondary | ICD-10-CM | POA: Diagnosis not present

## 2020-08-07 DIAGNOSIS — E7800 Pure hypercholesterolemia, unspecified: Secondary | ICD-10-CM | POA: Diagnosis not present

## 2020-08-07 DIAGNOSIS — N400 Enlarged prostate without lower urinary tract symptoms: Secondary | ICD-10-CM | POA: Diagnosis not present

## 2020-08-08 DIAGNOSIS — I2510 Atherosclerotic heart disease of native coronary artery without angina pectoris: Secondary | ICD-10-CM | POA: Diagnosis not present

## 2020-08-08 DIAGNOSIS — I4891 Unspecified atrial fibrillation: Secondary | ICD-10-CM | POA: Diagnosis not present

## 2020-08-08 DIAGNOSIS — I714 Abdominal aortic aneurysm, without rupture: Secondary | ICD-10-CM | POA: Diagnosis not present

## 2020-08-08 DIAGNOSIS — Z7901 Long term (current) use of anticoagulants: Secondary | ICD-10-CM | POA: Diagnosis not present

## 2020-08-08 DIAGNOSIS — K219 Gastro-esophageal reflux disease without esophagitis: Secondary | ICD-10-CM | POA: Diagnosis not present

## 2020-08-08 DIAGNOSIS — R809 Proteinuria, unspecified: Secondary | ICD-10-CM | POA: Diagnosis not present

## 2020-08-08 DIAGNOSIS — E7800 Pure hypercholesterolemia, unspecified: Secondary | ICD-10-CM | POA: Diagnosis not present

## 2020-08-08 DIAGNOSIS — M109 Gout, unspecified: Secondary | ICD-10-CM | POA: Diagnosis not present

## 2020-08-08 DIAGNOSIS — D649 Anemia, unspecified: Secondary | ICD-10-CM | POA: Diagnosis not present

## 2020-08-08 DIAGNOSIS — I10 Essential (primary) hypertension: Secondary | ICD-10-CM | POA: Diagnosis not present

## 2020-08-08 DIAGNOSIS — N400 Enlarged prostate without lower urinary tract symptoms: Secondary | ICD-10-CM | POA: Diagnosis not present

## 2020-08-09 ENCOUNTER — Telehealth: Payer: Self-pay

## 2020-08-09 NOTE — Telephone Encounter (Signed)
Called regarding recent lab work shared from Dr. Drema Dallas. Ferritin level is less than 250, no phlebotomy not needed, per Dr. Alvy Bimler. Offered appt with Dr. Alvy Bimler in 6 months or her can continue to see PCP with labs. He declined appt with Dr. Alvy Bimler. He will continue to see PCP with labs.

## 2020-08-13 ENCOUNTER — Other Ambulatory Visit: Payer: Self-pay

## 2020-08-13 ENCOUNTER — Ambulatory Visit: Payer: PPO | Admitting: Cardiology

## 2020-08-13 ENCOUNTER — Encounter: Payer: Self-pay | Admitting: Cardiology

## 2020-08-13 VITALS — BP 110/68 | HR 81 | Ht 64.0 in | Wt 143.0 lb

## 2020-08-13 DIAGNOSIS — I10 Essential (primary) hypertension: Secondary | ICD-10-CM | POA: Diagnosis not present

## 2020-08-13 DIAGNOSIS — I25118 Atherosclerotic heart disease of native coronary artery with other forms of angina pectoris: Secondary | ICD-10-CM

## 2020-08-13 NOTE — Progress Notes (Signed)
Cardiology Office Note:    Date:  08/13/2020   ID:  Corey Garcia, DOB [birth year], MRN [id number]  PCP:  [name], MD  Cardiologist:  Sanda Klein, MD  Electrophysiologist:  None   Referring MD: [name], MD   Chief Complaint  Patient presents with  . Follow-up    1 month.    History of Present Illness:    Corey Garcia is a pleasant 74 y.o. male with a hx of coronary disease, vascular disease, atrial fibrillation, hypertension, and treated dyslipidemia.  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  He developed chest pain while working.  He went home but came back later that night and was taken to the Cath Lab.  He had a 95% LAD and an 80 to 90% OM and 50% RCA.  He he underwent PCI by Dr. Claiborne Billings to the LAD and OM.  He is done well from a cardiac standpoint since.  He did have a Myoview in 2014 that was low risk.  He is followed by Dr. Sallyanne Kuster.  He saw Dr Sallyanne Kuster 07/19/2020 and was noted to be in AF with CVR which was new for him.  He is asymptomatic with this.  He is now on Eliquis and tolerating this well. He is seen today for a one month f/u.   He also has vascular disease and is status post EVAR by Dr. Doren Custard in 2019.  The patient has mild aortic stenosis by echocardiogram.  Since we saw him last he denies any chest pain or unusual dyspnea.  He says he can walk 30 to 40 minutes a day without problems.  He is completely unaware of his atrial fibrillation.  Past Medical History:  Diagnosis Date  . Alcoholism ([place]) 03/15/2014  . Anemia   . Arthritis    all over, some ra  . Bradycardia by electrocardiogram    BB decreased 11/12/11  . CAD (coronary artery disease)    last cath=01/2008, PCI  . CHF (congestive heart failure) ([place])   . Dyslipidemia   . ED (erectile dysfunction)    viagra helps  . GERD (gastroesophageal reflux disease)   . HTN (hypertension)   . Iron overload 03/15/2014  . 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 Murmur, cardiac    faint early systolic grade 1/6 aortic ejection murmur  . Myocardial infarction ([place])   . Peripheral vascular disease ([place])   . Rib pain 04/16/2014  . Skin cancer    basal cell ca X 4    Past Surgical History:  Procedure Laterality Date  . ABDOMINAL AORTIC ENDOVASCULAR STENT GRAFT N/A 01/13/2018   Procedure: ABDOMINAL AORTIC ENDOVASCULAR STENT GRAFT;  Surgeon: Angelia Mould, MD;  Location: [facility] OR;  Service: Vascular;  Laterality: N/A;  . CORONARY ANGIOPLASTY WITH STENT PLACEMENT  01/31/2008   PCI with cutting balloon arthrectomy of the ostium of the circ obtuse marg 2 vessel  . CORONARY ANGIOPLASTY WITH STENT PLACEMENT  01/27/08   nl LV function, PTCA/stenting of LAD with 3x6mm Cypher post dilated 3.64mm  . EYE SURGERY     cataract bil  . FRACTURE SURGERY     right elbow when 74 years old  . HERNIA REPAIR    . KNEE ARTHROSCOPY Left   . MOHS SURGERY     x4  . NASAL SEPTUM SURGERY     x2  . TOTAL KNEE ARTHROPLASTY Left 11/08/2018   Procedure:  TOTAL KNEE ARTHROPLASTY;  Surgeon: Renette Butters, MD;  Location: [facility] ORS;  Service: Orthopedics;  Laterality: Left;    Current Medications: Current Meds  Medication Sig  . amLODipine (NORVASC) 2.5 MG tablet Take 1 tablet (2.5 mg total) by mouth daily.  . AMOXICILLIN PO Take by mouth. Take prior to dental work.  Marland Kitchen apixaban (ELIQUIS) 5 MG TABS tablet Take 1 tablet (5 mg total) by mouth 2 (two) times daily.  Marland Kitchen atorvastatin (LIPITOR) 20 MG tablet Take 20 mg by mouth daily.  . bisoprolol (ZEBETA) 10 MG tablet Take 15 mg by mouth daily.   . cholecalciferol (VITAMIN D) 1000 units tablet Take 1,000 Units by mouth daily.  Mariane Baumgarten Sodium 100 MG capsule   . hydrocortisone (ANUSOL-HC) 2.5 % rectal cream Place 1 application rectally as needed for hemorrhoids or itching.  . loratadine (CLARITIN) 10 MG tablet Take 10 mg by mouth daily.   . pantoprazole (PROTONIX) 40 MG tablet Take 40 mg by  mouth daily.   . Probiotic Product (PROBIOTIC PO) Take 1 tablet by mouth daily.  . sildenafil (VIAGRA) 100 MG tablet   . tacrolimus (PROTOPIC) 0.03 % ointment Apply 1 application topically daily as needed (dermatitis on face).   . tamsulosin (FLOMAX) 0.4 MG CAPS capsule Take 0.4 mg by mouth daily.  . [DISCONTINUED] indomethacin (INDOCIN) 50 MG capsule Take 50 mg by mouth 3 (three) times daily as needed.     Allergies:   Lisinopril, Minocin [minocycline hcl], Sulfa antibiotics, Thiazide-type diuretics, and Codeine   Social History   Socioeconomic History  . Marital status: Married    Spouse name: Not on file  . Number of children: Not on file  . Years of education: Not on file  . Highest education level: Not on file  Occupational History  . Occupation: Pharmacist, community  Tobacco Use  . Smoking status: Former Smoker    Quit date: 10/31/1982    Years since quitting: 37.8  . Smokeless tobacco: Never Used  Vaping Use  . Vaping Use: Never used  Substance and Sexual Activity  . Alcohol use: Yes    Alcohol/week: 28.0 standard drinks    Types: 28 Cans of beer per week    Comment: "3-4 beers or hard apple ciders/ day" 01/10/2018  . Drug use: No  . Sexual activity: Not Currently  Other Topics Concern  . Not on file  Social History Narrative  . Not on file   Social Determinants of Health   Financial Resource Strain: Not on file  Food Insecurity: Not on file  Transportation Needs: Not on file  Physical Activity: Not on file  Stress: Not on file  Social Connections: Not on file     Family History: The patient's family history includes Heart disease in his brother, father, and mother; Heart failure in his brother, father, and mother.  ROS:   Please see the history of present illness.     All other systems reviewed and are negative.  EKGs/Labs/Other Studies Reviewed:    The following studies were reviewed today: Echo 07/19/2019- IMPRESSIONS    1. Left ventricular  ejection fraction, by visual estimation, is 60 to  65%. The left ventricle has normal function. There is no left ventricular  hypertrophy.  2. Left ventricular diastolic parameters are consistent with Grade II  diastolic dysfunction (pseudonormalization).  3. Global right ventricle has normal systolic function.The right  ventricular size is normal. No increase in right ventricular wall  thickness.  4. Left atrial  size was mildly dilated.  5. Right atrial size was normal.  6. Mild mitral annular calcification.  7. The mitral valve is normal in structure. No evidence of mitral valve  regurgitation. No evidence of mitral stenosis.  8. The tricuspid valve is normal in structure. Tricuspid valve  regurgitation is not demonstrated.  9. The aortic valve was not well visualized. Aortic valve regurgitation  is trivial. Mild to moderate aortic valve stenosis.  10. The pulmonic valve was normal in structure. Pulmonic valve  regurgitation is not visualized.  11. Normal pulmonary artery systolic pressure.  12. The inferior vena cava is normal in size with greater than 50%  respiratory variability, suggesting right atrial pressure of 3 mmHg.   EKG:  EKG is ordered today.  The ekg ordered today demonstrates AF with VR 81, poor anterior RW  Recent Labs: 07/18/2020: ALT 28; BUN 13; Creatinine, Ser 0.95; Hemoglobin 12.4; Platelets 185; Potassium 4.2; Sodium 133  Recent Lipid Panel    Component Value Date/Time   CHOL 142 07/18/2020 0930   TRIG 66 07/18/2020 0930   HDL 67 07/18/2020 0930   CHOLHDL 2.1 07/18/2020 0930   CHOLHDL 3.0 01/27/2008 0652   VLDL 10 01/27/2008 0652   LDLCALC 62 07/18/2020 0930    Physical Exam:    VS:  BP 110/68 (BP Location: Left Arm, Patient Position: Sitting, Cuff Size: Normal)   Pulse 81   Ht 5\' 4"  (1.626 m)   Wt 143 lb (64.9 kg)   BMI 24.55 kg/m     Wt Readings from Last 3 Encounters:  08/13/20 143 lb (64.9 kg)  07/18/20 146 lb (66.2 kg)  01/17/20  147 lb (66.7 kg)     GEN:  Well nourished, well developed in no acute distress HEENT: Normal NECK: No JVD; No carotid bruits CARDIAC:irregularly irregular with 3-1/5 honking systolic murmur AOV, LSB, preserved S2, no rubs, gallops RESPIRATORY:  Clear to auscultation without rales, wheezing or rhonchi  ABDOMEN: Soft, non-tender, non-distended MUSCULOSKELETAL:  No edema; No deformity  SKIN: Warm and dry NEUROLOGIC:  Alert and oriented x 3 PSYCHIATRIC:  Normal affect   ASSESSMENT:    AF- Asymptomatic, rate controlled - first noted nov 2021  Chronic anticoagulation- On Eliquis, CHADs VASc=3  PVD- S/P EVAR 2019- Dr Scot Dock follows  CAD- S/O LAD and OM PCI 2009- low risk Nuc 2014, no angina  HLD- LDL 62 Nov 2021  HTN- Controlled  Aortic stenosis- Mild to moderate by echo Nov 2020- repeat echo Nov 2022.    PLAN:    Plan- F/U Dr Sallyanne Kuster in 6 months- no change in Rx.    Medication Adjustments/Labs and Tests Ordered: Current medicines are reviewed at length with the patient today.  Concerns regarding medicines are outlined above.  No orders of the defined types were placed in this encounter.  No orders of the defined types were placed in this encounter.   There are no Patient Instructions on file for this visit.   Angelena Form, PA-C  08/13/2020 10:48 AM    [facility]

## 2020-08-13 NOTE — Patient Instructions (Signed)
Medication Instructions:  Continue current medications  *If you need a refill on your cardiac medications before your next appointment, please call your pharmacy*   Lab Work: None Ordered   Testing/Procedures: None Ordered   Follow-Up: At [facility], you and your health needs are our priority.  As part of our continuing mission to provide you with exceptional heart care, we have created designated Provider Care Teams.  These Care Teams include your primary Cardiologist (physician) and Advanced Practice Providers (APPs -  Physician Assistants and Nurse Practitioners) who all work together to provide you with the care you need, when you need it.  We recommend signing up for the patient portal called "MyChart".  Sign up information is provided on this After Visit Summary.  MyChart is used to connect with patients for Virtual Visits (Telemedicine).  Patients are able to view lab/test results, encounter notes, upcoming appointments, etc.  Non-urgent messages can be sent to your provider as well.   To learn more about what you can do with MyChart, go to [email or web address].    Your next appointment:   6 month(s)  The format for your next appointment:   In Person  Provider:   You may see Mihai Croitoru, MD or one of the following Advanced Practice Providers on your designated Care Team:    Hao Meng, PA-C  Angela Duke, PA-C or   Krista Kroeger, PA-C     

## 2020-08-21 DIAGNOSIS — N400 Enlarged prostate without lower urinary tract symptoms: Secondary | ICD-10-CM | POA: Diagnosis not present

## 2020-08-21 DIAGNOSIS — M1712 Unilateral primary osteoarthritis, left knee: Secondary | ICD-10-CM | POA: Diagnosis not present

## 2020-08-21 DIAGNOSIS — K219 Gastro-esophageal reflux disease without esophagitis: Secondary | ICD-10-CM | POA: Diagnosis not present

## 2020-08-21 DIAGNOSIS — D649 Anemia, unspecified: Secondary | ICD-10-CM | POA: Diagnosis not present

## 2020-08-21 DIAGNOSIS — I4891 Unspecified atrial fibrillation: Secondary | ICD-10-CM | POA: Diagnosis not present

## 2020-08-21 DIAGNOSIS — I10 Essential (primary) hypertension: Secondary | ICD-10-CM | POA: Diagnosis not present

## 2020-08-21 DIAGNOSIS — I2510 Atherosclerotic heart disease of native coronary artery without angina pectoris: Secondary | ICD-10-CM | POA: Diagnosis not present

## 2020-08-21 DIAGNOSIS — E7800 Pure hypercholesterolemia, unspecified: Secondary | ICD-10-CM | POA: Diagnosis not present

## 2020-09-04 ENCOUNTER — Ambulatory Visit: Payer: PPO | Admitting: Physician Assistant

## 2020-09-04 ENCOUNTER — Encounter: Payer: Self-pay | Admitting: Physician Assistant

## 2020-09-04 ENCOUNTER — Other Ambulatory Visit: Payer: Self-pay

## 2020-09-04 DIAGNOSIS — L570 Actinic keratosis: Secondary | ICD-10-CM

## 2020-09-04 DIAGNOSIS — C4442 Squamous cell carcinoma of skin of scalp and neck: Secondary | ICD-10-CM | POA: Diagnosis not present

## 2020-09-04 DIAGNOSIS — D485 Neoplasm of uncertain behavior of skin: Secondary | ICD-10-CM | POA: Diagnosis not present

## 2020-09-04 DIAGNOSIS — Z85828 Personal history of other malignant neoplasm of skin: Secondary | ICD-10-CM | POA: Diagnosis not present

## 2020-09-04 DIAGNOSIS — Z1283 Encounter for screening for malignant neoplasm of skin: Secondary | ICD-10-CM

## 2020-09-04 NOTE — Patient Instructions (Signed)

## 2020-09-11 DIAGNOSIS — R809 Proteinuria, unspecified: Secondary | ICD-10-CM | POA: Diagnosis not present

## 2020-09-11 DIAGNOSIS — I4891 Unspecified atrial fibrillation: Secondary | ICD-10-CM | POA: Diagnosis not present

## 2020-09-11 DIAGNOSIS — I10 Essential (primary) hypertension: Secondary | ICD-10-CM | POA: Diagnosis not present

## 2020-09-11 DIAGNOSIS — I5032 Chronic diastolic (congestive) heart failure: Secondary | ICD-10-CM | POA: Diagnosis not present

## 2020-09-12 ENCOUNTER — Telehealth: Payer: Self-pay | Admitting: Physician Assistant

## 2020-09-12 NOTE — Telephone Encounter (Signed)
Results

## 2020-09-13 ENCOUNTER — Encounter: Payer: Self-pay | Admitting: Physician Assistant

## 2020-09-13 DIAGNOSIS — E7800 Pure hypercholesterolemia, unspecified: Secondary | ICD-10-CM | POA: Diagnosis not present

## 2020-09-13 DIAGNOSIS — I2510 Atherosclerotic heart disease of native coronary artery without angina pectoris: Secondary | ICD-10-CM | POA: Diagnosis not present

## 2020-09-13 DIAGNOSIS — I4891 Unspecified atrial fibrillation: Secondary | ICD-10-CM | POA: Diagnosis not present

## 2020-09-13 DIAGNOSIS — K219 Gastro-esophageal reflux disease without esophagitis: Secondary | ICD-10-CM | POA: Diagnosis not present

## 2020-09-13 DIAGNOSIS — D649 Anemia, unspecified: Secondary | ICD-10-CM | POA: Diagnosis not present

## 2020-09-13 DIAGNOSIS — M1712 Unilateral primary osteoarthritis, left knee: Secondary | ICD-10-CM | POA: Diagnosis not present

## 2020-09-13 DIAGNOSIS — I10 Essential (primary) hypertension: Secondary | ICD-10-CM | POA: Diagnosis not present

## 2020-09-13 DIAGNOSIS — N400 Enlarged prostate without lower urinary tract symptoms: Secondary | ICD-10-CM | POA: Diagnosis not present

## 2020-09-13 NOTE — Progress Notes (Signed)
Follow-Up Visit   Subjective  Corey Garcia is a 75 y.o. male who presents for the following: Annual Exam (Few scaly spots on ear lobes, right hand  & forehead).   The following portions of the chart were reviewed this encounter and updated as appropriate:  Tobacco  Allergies  Meds  Problems  Med Hx  Surg Hx  Fam Hx      Objective  Well appearing patient in no apparent distress; mood and affect are within normal limits.  A focused examination was performed including hands and scalp. Relevant physical exam findings are noted in the Assessment and Plan.  Objective  Right Breast: Hands, face & arm skin examination  Objective  Left Breast: No atypical nevi Multiple white scar- clear  Objective  Neck - Anterior: Multiple white scar- clear   Objective  Right Dorsal Hand: Hyperkeratotic scale with pink base     Objective  Left Dorsal Hand, Left Wrist - Posterior, Mid Forehead (6), Mid Parietal Scalp (2), Right Dorsal Hand: Erythematous patches with gritty scale.  Objective  Mid Parietal Scalp: hyperkeratotic scale with pink base  14cm to right upper ear junction       Objective  Mid Frontal Scalp: Hyperkeratotic scale with pink base  13cm- right upper ear junction        Assessment & Plan  Encounter for screening for malignant neoplasm of skin Right Breast  Yearly skin check  History of squamous cell carcinoma of skin Left Breast  Yearly skin check  History of basal cell cancer Neck - Anterior  Yearly skin check  Neoplasm of uncertain behavior of skin Right Dorsal Hand  Skin / nail biopsy Type of biopsy: tangential   Informed consent: discussed and consent obtained   Timeout: patient name, date of birth, surgical site, and procedure verified   Procedure prep:  Patient was prepped and draped in usual sterile fashion (Non sterile) Prep type:  Chlorhexidine Anesthesia: the lesion was anesthetized in a standard fashion    Anesthetic:  1% lidocaine w/ epinephrine 1-100,000 local infiltration Instrument used: flexible razor blade   Outcome: patient tolerated procedure well   Post-procedure details: wound care instructions given    Destruction of lesion Complexity: simple   Destruction method: electrodesiccation and curettage   Informed consent: discussed and consent obtained   Timeout:  patient name, date of birth, surgical site, and procedure verified Anesthesia: the lesion was anesthetized in a standard fashion   Anesthetic:  1% lidocaine w/ epinephrine 1-100,000 local infiltration Curettage performed in three different directions: Yes   Curettage cycles:  1 Margin per side (cm):  0.1 Final wound size (cm):  1 Hemostasis achieved with:  aluminum chloride Outcome: patient tolerated procedure well with no complications   Post-procedure details: wound care instructions given    Specimen 1 - Surgical pathology Differential Diagnosis: bcc vs scc  Check Margins: yes  AK (actinic keratosis) (11) Left Wrist - Posterior; Left Dorsal Hand; Right Dorsal Hand; Mid Parietal Scalp (2); Mid Forehead (6)  Destruction of lesion - Left Dorsal Hand, Left Wrist - Posterior, Mid Forehead, Mid Parietal Scalp (2), Right Dorsal Hand Complexity: simple   Destruction method: cryotherapy   Informed consent: discussed and consent obtained   Timeout:  patient name, date of birth, surgical site, and procedure verified Lesion destroyed using liquid nitrogen: Yes   Cryotherapy cycles:  3 Outcome: patient tolerated procedure well with no complications    SCC (squamous cell carcinoma), scalp/neck (2) Mid Parietal Scalp  Skin / nail biopsy Type of biopsy: tangential   Informed consent: discussed and consent obtained   Timeout: patient name, date of birth, surgical site, and procedure verified   Procedure prep:  Patient was prepped and draped in usual sterile fashion (Non sterile) Prep type:  Chlorhexidine Anesthesia: the  lesion was anesthetized in a standard fashion   Anesthetic:  1% lidocaine w/ epinephrine 1-100,000 local infiltration Instrument used: flexible razor blade   Outcome: patient tolerated procedure well   Post-procedure details: wound care instructions given    Destruction of lesion Complexity: simple   Destruction method: electrodesiccation and curettage   Informed consent: discussed and consent obtained   Timeout:  patient name, date of birth, surgical site, and procedure verified Anesthesia: the lesion was anesthetized in a standard fashion   Anesthetic:  1% lidocaine w/ epinephrine 1-100,000 local infiltration Curettage performed in three different directions: Yes   Curettage cycles:  1 Margin per side (cm):  0.1 Final wound size (cm):  1.5 Hemostasis achieved with:  aluminum chloride Outcome: patient tolerated procedure well with no complications   Post-procedure details: wound care instructions given    Specimen 2 - Surgical pathology Differential Diagnosis: bcc vs scc  Check Margins: yes  Mid Frontal Scalp  Skin / nail biopsy Type of biopsy: tangential   Informed consent: discussed and consent obtained   Timeout: patient name, date of birth, surgical site, and procedure verified   Procedure prep:  Patient was prepped and draped in usual sterile fashion (Non sterile) Prep type:  Chlorhexidine Anesthesia: the lesion was anesthetized in a standard fashion   Anesthetic:  1% lidocaine w/ epinephrine 1-100,000 local infiltration Instrument used: flexible razor blade   Outcome: patient tolerated procedure well   Post-procedure details: wound care instructions given    Destruction of lesion Complexity: simple   Destruction method: electrodesiccation and curettage   Informed consent: discussed and consent obtained   Timeout:  patient name, date of birth, surgical site, and procedure verified Anesthesia: the lesion was anesthetized in a standard fashion   Anesthetic:  1%  lidocaine w/ epinephrine 1-100,000 local infiltration Curettage performed in three different directions: Yes   Curettage cycles:  1 Margin per side (cm):  0.1 Final wound size (cm):  1 Hemostasis achieved with:  aluminum chloride Outcome: patient tolerated procedure well with no complications   Post-procedure details: wound care instructions given    Specimen 3 - Surgical pathology Differential Diagnosis: bcc vs scc  Check Margins: yes    I, Lendora Keys, PA-C, have reviewed all documentation's for this visit.  The documentation on 09/13/20 for the exam, diagnosis, procedures and orders are all accurate and complete.

## 2020-09-13 NOTE — Telephone Encounter (Signed)
-----   Message from Warren Danes, [place] sent at 09/13/2020 10:30 AM EST ----- Recheck scalp in 8-12 wks. Pt to rtc if recur prior to that.

## 2020-09-13 NOTE — Telephone Encounter (Signed)
Phone call to patient with his pathology results. Patient aware.  

## 2020-10-14 ENCOUNTER — Other Ambulatory Visit: Payer: Self-pay

## 2020-10-14 MED ORDER — APIXABAN 5 MG PO TABS: 5.0000 mg | ORAL_TABLET | Freq: Two times a day (BID) | ORAL | 5 refills | Status: DC

## 2020-10-14 NOTE — Telephone Encounter (Signed)
Prescription refill request for Eliquis received. Indication:atrial fibrillation Last office visit: 12/21 Lurena Joiner Scr:0.92  12/21 Age: 75 Weight: 64.9 kg  Prescription refilled

## 2020-11-12 ENCOUNTER — Other Ambulatory Visit: Payer: Self-pay | Admitting: Cardiovascular Disease

## 2020-11-13 DIAGNOSIS — K219 Gastro-esophageal reflux disease without esophagitis: Secondary | ICD-10-CM | POA: Diagnosis not present

## 2020-11-13 DIAGNOSIS — I4891 Unspecified atrial fibrillation: Secondary | ICD-10-CM | POA: Diagnosis not present

## 2020-11-13 DIAGNOSIS — I2510 Atherosclerotic heart disease of native coronary artery without angina pectoris: Secondary | ICD-10-CM | POA: Diagnosis not present

## 2020-11-13 DIAGNOSIS — N400 Enlarged prostate without lower urinary tract symptoms: Secondary | ICD-10-CM | POA: Diagnosis not present

## 2020-11-13 DIAGNOSIS — E7800 Pure hypercholesterolemia, unspecified: Secondary | ICD-10-CM | POA: Diagnosis not present

## 2020-11-13 DIAGNOSIS — M1712 Unilateral primary osteoarthritis, left knee: Secondary | ICD-10-CM | POA: Diagnosis not present

## 2020-11-13 DIAGNOSIS — D649 Anemia, unspecified: Secondary | ICD-10-CM | POA: Diagnosis not present

## 2020-11-13 DIAGNOSIS — I10 Essential (primary) hypertension: Secondary | ICD-10-CM | POA: Diagnosis not present

## 2020-11-15 DIAGNOSIS — R112 Nausea with vomiting, unspecified: Secondary | ICD-10-CM | POA: Diagnosis not present

## 2020-11-15 DIAGNOSIS — R197 Diarrhea, unspecified: Secondary | ICD-10-CM | POA: Diagnosis not present

## 2020-11-22 DIAGNOSIS — Z7901 Long term (current) use of anticoagulants: Secondary | ICD-10-CM | POA: Diagnosis not present

## 2020-11-22 DIAGNOSIS — R197 Diarrhea, unspecified: Secondary | ICD-10-CM | POA: Diagnosis not present

## 2020-11-22 DIAGNOSIS — R112 Nausea with vomiting, unspecified: Secondary | ICD-10-CM | POA: Diagnosis not present

## 2020-12-03 ENCOUNTER — Other Ambulatory Visit: Payer: Self-pay

## 2020-12-03 ENCOUNTER — Encounter: Payer: Self-pay | Admitting: Physician Assistant

## 2020-12-03 ENCOUNTER — Ambulatory Visit: Payer: PPO | Admitting: Physician Assistant

## 2020-12-03 DIAGNOSIS — L089 Local infection of the skin and subcutaneous tissue, unspecified: Secondary | ICD-10-CM

## 2020-12-03 DIAGNOSIS — D044 Carcinoma in situ of skin of scalp and neck: Secondary | ICD-10-CM

## 2020-12-03 DIAGNOSIS — B9689 Other specified bacterial agents as the cause of diseases classified elsewhere: Secondary | ICD-10-CM

## 2020-12-03 MED ORDER — CEPHALEXIN 500 MG PO CAPS: 500.0000 mg | ORAL_CAPSULE | Freq: Two times a day (BID) | ORAL | 0 refills | Status: DC

## 2020-12-04 ENCOUNTER — Ambulatory Visit: Payer: PPO | Attending: Internal Medicine

## 2020-12-04 DIAGNOSIS — Z23 Encounter for immunization: Secondary | ICD-10-CM

## 2020-12-04 NOTE — Progress Notes (Signed)
   Covid-19 Vaccination Clinic  Name:  Corey Garcia    MRN: [id number] DOB: September 26, 1945  12/04/2020  Corey Garcia was observed post Covid-19 immunization for 15 minutes without incident. He was provided with Vaccine Information Sheet and instruction to access the V-Safe system.   Corey Garcia was instructed to call 911 with any severe reactions post vaccine: Marland Kitchen Difficulty breathing  . Swelling of face and throat  . A fast heartbeat  . A bad rash all over body  . Dizziness and weakness   Immunizations Administered    Name Date Dose VIS Date Route   PFIZER Comrnaty(Gray TOP) Covid-19 Vaccine 12/04/2020 12:35 PM 0.3 mL 08/08/2020 Intramuscular   Manufacturer: [employer], [employer]   Lot: [id number]   [place]: [phone]

## 2020-12-09 LAB — ANAEROBIC AND AEROBIC CULTURE
MICRO NUMBER:: 11732821
MICRO NUMBER:: 11732822
SPECIMEN QUALITY:: ADEQUATE
SPECIMEN QUALITY:: ADEQUATE

## 2020-12-11 ENCOUNTER — Telehealth: Payer: Self-pay | Admitting: *Deleted

## 2020-12-11 NOTE — Telephone Encounter (Signed)
Bacterial culture to patient.

## 2020-12-16 ENCOUNTER — Other Ambulatory Visit (HOSPITAL_COMMUNITY): Payer: Self-pay

## 2020-12-16 MED ORDER — COVID-19 MRNA VAC-TRIS(PFIZER) 30 MCG/0.3ML IM SUSP
INTRAMUSCULAR | 0 refills | Status: DC
  Filled 2020-12-16: qty 0.3, 17d supply, fill #0

## 2020-12-18 ENCOUNTER — Other Ambulatory Visit (HOSPITAL_COMMUNITY): Payer: Self-pay

## 2020-12-19 ENCOUNTER — Ambulatory Visit: Payer: PPO | Admitting: Physician Assistant

## 2020-12-19 ENCOUNTER — Encounter: Payer: Self-pay | Admitting: Physician Assistant

## 2020-12-19 ENCOUNTER — Other Ambulatory Visit: Payer: Self-pay

## 2020-12-19 DIAGNOSIS — C44329 Squamous cell carcinoma of skin of other parts of face: Secondary | ICD-10-CM

## 2020-12-19 DIAGNOSIS — D485 Neoplasm of uncertain behavior of skin: Secondary | ICD-10-CM

## 2020-12-19 DIAGNOSIS — C44320 Squamous cell carcinoma of skin of unspecified parts of face: Secondary | ICD-10-CM

## 2020-12-19 DIAGNOSIS — D044 Carcinoma in situ of skin of scalp and neck: Secondary | ICD-10-CM

## 2020-12-19 DIAGNOSIS — C4492 Squamous cell carcinoma of skin, unspecified: Secondary | ICD-10-CM

## 2020-12-19 DIAGNOSIS — L570 Actinic keratosis: Secondary | ICD-10-CM | POA: Diagnosis not present

## 2020-12-19 DIAGNOSIS — L738 Other specified follicular disorders: Secondary | ICD-10-CM

## 2020-12-19 HISTORY — DX: Squamous cell carcinoma of skin, unspecified: C44.92

## 2020-12-19 NOTE — Patient Instructions (Signed)

## 2020-12-25 NOTE — Progress Notes (Signed)
   Follow-Up Visit   Subjective  Corey Garcia is a 75 y.o. male who presents for the following: Follow-up (Here for 12 week f/u on mid parietal scalp & mid frontal scalp- scc x 2. Treated and still have some scale. Per chart records review patient has had multiple non mole skin cancers. He has never been diagnosed with MRSA but his scalp has numerous open wounds, scabs and excoriations. It is very tender to the touch.    The following portions of the chart were reviewed this encounter and updated as appropriate:  Tobacco  Allergies  Meds  Problems  Med Hx  Surg Hx  Fam Hx      Objective  Well appearing patient in no apparent distress; mood and affect are within normal limits.  A full examination was performed including scalp, head, eyes, ears, nose, lips, neck, chest, axillae, abdomen, back, buttocks, bilateral upper extremities, bilateral lower extremities, hands, feet, fingers, toes, fingernails, and toenails. All findings within normal limits unless otherwise noted below.  Objective  top of scalp: Numerous open wounds on the scalp- no lymphadenopathy  Objective  Mid Frontal Scalp: Thick scale  Objective  Mid Parietal Scalp: Erythema and slight scale   Assessment & Plan  Skin infection, bacterial top of scalp  Rule out MRSA.  Anaerobic and Aerobic Culture - top of scalp  cephALEXin (KEFLEX) 500 MG capsule - top of scalp  Carcinoma in situ of skin of scalp (2) Mid Frontal Scalp  Mid Parietal Scalp  Recheck in 3 weeks   I, Evalyn Shultis, PA-C, have reviewed all documentation's for this visit.  The documentation on 12/25/20 for the exam, diagnosis, procedures and orders are all accurate and complete.

## 2020-12-30 ENCOUNTER — Telehealth: Payer: Self-pay

## 2020-12-30 NOTE — Telephone Encounter (Signed)
-----   Message from Warren Danes, [place] sent at 12/30/2020  2:35 PM EDT ----- All lesions were treated at the time of the biopsy. He has an appt. Soon. If recurrent - right malar cheek... Mohs

## 2020-12-30 NOTE — Telephone Encounter (Signed)
Phone call to patient with his pathology results. Message left for patient to give the office a call back. 

## 2021-01-01 ENCOUNTER — Other Ambulatory Visit: Payer: Self-pay

## 2021-01-01 DIAGNOSIS — I714 Abdominal aortic aneurysm, without rupture, unspecified: Secondary | ICD-10-CM

## 2021-01-10 DIAGNOSIS — I2510 Atherosclerotic heart disease of native coronary artery without angina pectoris: Secondary | ICD-10-CM | POA: Diagnosis not present

## 2021-01-10 DIAGNOSIS — I4891 Unspecified atrial fibrillation: Secondary | ICD-10-CM | POA: Diagnosis not present

## 2021-01-10 DIAGNOSIS — N400 Enlarged prostate without lower urinary tract symptoms: Secondary | ICD-10-CM | POA: Diagnosis not present

## 2021-01-10 DIAGNOSIS — E7800 Pure hypercholesterolemia, unspecified: Secondary | ICD-10-CM | POA: Diagnosis not present

## 2021-01-10 DIAGNOSIS — K219 Gastro-esophageal reflux disease without esophagitis: Secondary | ICD-10-CM | POA: Diagnosis not present

## 2021-01-10 DIAGNOSIS — D649 Anemia, unspecified: Secondary | ICD-10-CM | POA: Diagnosis not present

## 2021-01-10 DIAGNOSIS — I10 Essential (primary) hypertension: Secondary | ICD-10-CM | POA: Diagnosis not present

## 2021-01-10 DIAGNOSIS — M1712 Unilateral primary osteoarthritis, left knee: Secondary | ICD-10-CM | POA: Diagnosis not present

## 2021-01-13 NOTE — Progress Notes (Signed)
Patient needs more labs and a urinalysis for further evaluation.

## 2021-01-13 NOTE — Progress Notes (Signed)
Wrong patient. Disregard the last note.

## 2021-01-14 DIAGNOSIS — J309 Allergic rhinitis, unspecified: Secondary | ICD-10-CM | POA: Insufficient documentation

## 2021-01-14 DIAGNOSIS — I4891 Unspecified atrial fibrillation: Secondary | ICD-10-CM | POA: Insufficient documentation

## 2021-01-14 DIAGNOSIS — D692 Other nonthrombocytopenic purpura: Secondary | ICD-10-CM | POA: Insufficient documentation

## 2021-01-14 DIAGNOSIS — R809 Proteinuria, unspecified: Secondary | ICD-10-CM | POA: Insufficient documentation

## 2021-01-14 DIAGNOSIS — Z7901 Long term (current) use of anticoagulants: Secondary | ICD-10-CM | POA: Insufficient documentation

## 2021-01-14 DIAGNOSIS — R3129 Other microscopic hematuria: Secondary | ICD-10-CM | POA: Insufficient documentation

## 2021-01-14 DIAGNOSIS — Z8589 Personal history of malignant neoplasm of other organs and systems: Secondary | ICD-10-CM | POA: Insufficient documentation

## 2021-01-14 DIAGNOSIS — M1712 Unilateral primary osteoarthritis, left knee: Secondary | ICD-10-CM | POA: Insufficient documentation

## 2021-01-14 DIAGNOSIS — I359 Nonrheumatic aortic valve disorder, unspecified: Secondary | ICD-10-CM | POA: Insufficient documentation

## 2021-01-14 DIAGNOSIS — K7460 Unspecified cirrhosis of liver: Secondary | ICD-10-CM | POA: Insufficient documentation

## 2021-01-14 DIAGNOSIS — Z96652 Presence of left artificial knee joint: Secondary | ICD-10-CM | POA: Insufficient documentation

## 2021-01-14 DIAGNOSIS — K219 Gastro-esophageal reflux disease without esophagitis: Secondary | ICD-10-CM | POA: Insufficient documentation

## 2021-01-14 DIAGNOSIS — Z955 Presence of coronary angioplasty implant and graft: Secondary | ICD-10-CM | POA: Insufficient documentation

## 2021-01-14 DIAGNOSIS — E7800 Pure hypercholesterolemia, unspecified: Secondary | ICD-10-CM | POA: Insufficient documentation

## 2021-01-14 NOTE — Progress Notes (Signed)
Follow-Up Visit   Subjective  Corey Garcia is a 75 y.o. male who presents for the following: Follow-up (Follow up on wound infection on scalp. Patient took keflex 500 mg bid x 10 days. Patient says it is some better but there are still some crusts and sensitive to the touch. Patient did have biopsy's on scalp in January that came back [facility].).   The following portions of the chart were reviewed this encounter and updated as appropriate:  Tobacco  Allergies  Meds  Problems  Med Hx  Surg Hx  Fam Hx      Objective  Well appearing patient in no apparent distress; mood and affect are within normal limits.  All skin waist up examined.  Objective  Left Malar Cheek medial: Pearly papule with telangectasia.      Objective  Mid Parietal Scalp (5): Erythematous patches with gritty scale.  Left Malar Cheek lateral     Objective  right scalp posterior: Pearly papule with telangectasia.      Objective  right scalp anterior: Hyperkeratotic scale with pink base      Assessment & Plan  Neoplasm of uncertain behavior of skin Left Malar Cheek medial  Skin / nail biopsy Type of biopsy: tangential   Informed consent: discussed and consent obtained   Timeout: patient name, date of birth, surgical site, and procedure verified   Anesthesia: the lesion was anesthetized in a standard fashion   Anesthetic:  1% lidocaine w/ epinephrine 1-100,000 local infiltration Instrument used: flexible razor blade   Hemostasis achieved with: ferric subsulfate   Outcome: patient tolerated procedure well   Post-procedure details: wound care instructions given    Destruction of lesion Complexity: simple   Destruction method: electrodesiccation and curettage   Informed consent: discussed and consent obtained   Timeout:  patient name, date of birth, surgical site, and procedure verified Anesthesia: the lesion was anesthetized in a standard fashion   Anesthetic:  1% lidocaine w/  epinephrine 1-100,000 local infiltration Curettage performed in three different directions: Yes   Curettage cycles:  3 Margin per side (cm):  0.1 Final wound size (cm):  1 Hemostasis achieved with:  aluminum chloride Outcome: patient tolerated procedure well with no complications   Post-procedure details: wound care instructions given    Specimen 1 - Surgical pathology Differential Diagnosis: scc vs bcc  Check Margins: No  AK (actinic keratosis) (5) Mid Parietal Scalp  Destruction of lesion - Mid Parietal Scalp Complexity: simple   Destruction method: cryotherapy   Informed consent: discussed and consent obtained   Timeout:  patient name, date of birth, surgical site, and procedure verified Lesion destroyed using liquid nitrogen: Yes   Cryotherapy cycles:  1 Outcome: patient tolerated procedure well with no complications   Post-procedure details: wound care instructions given    SCC (squamous cell carcinoma), face Left Malar Cheek lateral  Skin / nail biopsy Type of biopsy: tangential   Informed consent: discussed and consent obtained   Timeout: patient name, date of birth, surgical site, and procedure verified   Anesthesia: the lesion was anesthetized in a standard fashion   Anesthetic:  1% lidocaine w/ epinephrine 1-100,000 local infiltration Instrument used: flexible razor blade   Hemostasis achieved with: ferric subsulfate   Outcome: patient tolerated procedure well   Post-procedure details: wound care instructions given    Destruction of lesion Complexity: simple   Destruction method: electrodesiccation and curettage   Informed consent: discussed and consent obtained   Timeout:  patient name, date  of birth, surgical site, and procedure verified Anesthesia: the lesion was anesthetized in a standard fashion   Anesthetic:  1% lidocaine w/ epinephrine 1-100,000 local infiltration Curettage performed in three different directions: Yes   Curettage cycles:  3 Margin  per side (cm):  0.1 Final wound size (cm):  0.9 Hemostasis achieved with:  aluminum chloride Outcome: patient tolerated procedure well with no complications   Post-procedure details: wound care instructions given    Specimen 2 - Surgical pathology Differential Diagnosis: scc vs bcc  Check Margins: No  Carcinoma in situ of skin of scalp (2) right scalp posterior  Skin / nail biopsy Type of biopsy: tangential   Informed consent: discussed and consent obtained   Timeout: patient name, date of birth, surgical site, and procedure verified   Anesthesia: the lesion was anesthetized in a standard fashion   Anesthetic:  1% lidocaine w/ epinephrine 1-100,000 local infiltration Instrument used: flexible razor blade   Hemostasis achieved with: ferric subsulfate   Outcome: patient tolerated procedure well   Post-procedure details: wound care instructions given    Destruction of lesion Complexity: simple   Destruction method: electrodesiccation and curettage   Informed consent: discussed and consent obtained   Timeout:  patient name, date of birth, surgical site, and procedure verified Anesthesia: the lesion was anesthetized in a standard fashion   Anesthetic:  1% lidocaine w/ epinephrine 1-100,000 local infiltration Curettage performed in three different directions: Yes   Curettage cycles:  3 Margin per side (cm):  0.1 Final wound size (cm):  1.5 Hemostasis achieved with:  aluminum chloride Outcome: patient tolerated procedure well with no complications   Post-procedure details: wound care instructions given    Specimen 3 - Surgical pathology Differential Diagnosis: scc vs bcc  Check Margins: No  right scalp anterior  Skin / nail biopsy Type of biopsy: tangential   Informed consent: discussed and consent obtained   Timeout: patient name, date of birth, surgical site, and procedure verified   Anesthesia: the lesion was anesthetized in a standard fashion   Anesthetic:  1%  lidocaine w/ epinephrine 1-100,000 local infiltration Instrument used: flexible razor blade   Hemostasis achieved with: ferric subsulfate   Outcome: patient tolerated procedure well   Post-procedure details: wound care instructions given    Destruction of lesion Complexity: simple   Destruction method: electrodesiccation and curettage   Informed consent: discussed and consent obtained   Timeout:  patient name, date of birth, surgical site, and procedure verified Anesthesia: the lesion was anesthetized in a standard fashion   Anesthetic:  1% lidocaine w/ epinephrine 1-100,000 local infiltration Curettage performed in three different directions: Yes   Curettage cycles:  3 Margin per side (cm):  0.1 Final wound size (cm):  1 Hemostasis achieved with:  aluminum chloride Outcome: patient tolerated procedure well with no complications   Post-procedure details: wound care instructions given    Specimen 4 - Surgical pathology Differential Diagnosis: scc vs bcc If positive and recurs MOHS Check Margins: No   I, Juda Toepfer, PA-C, have reviewed all documentation's for this visit.  The documentation on 01/14/21 for the exam, diagnosis, procedures and orders are all accurate and complete.

## 2021-01-15 NOTE — Progress Notes (Signed)
HISTORY AND PHYSICAL     CC:  follow up. Requesting Provider:  [name], MD  HPI: This is a 75 y.o. male who is here today for follow up for AAA.  He underwent repair of a 3.2 cm saccular aneurysm on 01/13/2018.  At the time of his most recent follow-up visit on 03/08/2019 the aneurysm had decreased in size to 2.8 cm.  Of note we could not visualize the common iliac arteries at the time of his last duplex.  I ordered a follow-up ultrasound in 9 months with the thought that if we could not see his iliac arteries we would have to get a CT scan.  Pt was last seen by Dr. Scot Dock 01/17/2020 and at that time, he had some c/o back pain and has hx of chronic back pain.  He did have some pain that radiated down the right leg.  Dr. Scot Dock did not feel he had claudication or rest pain.  Pt with hx of ESI with relief.    The pt returns today for follow up.   He has not had any issues and denies any new back or abdominal pain.  He denies any claudication sx or rest pain.  He states that he is not taking asa bc he was put on Eliquis for afib.  He has never had a stroke.  He has hx of MI in 2009.    He has a vacation planned at the end of June.  The pt is on a statin for cholesterol management.    The pt is not on an aspirin.    Other AC:  Eliquis The pt is on BB for hypertension.  The pt does not have diabetes. Tobacco hx:  former    Past Medical History:  Diagnosis Date  . Alcoholism ([place]) 03/15/2014  . Anemia   . Arthritis    all over, some ra  . Bradycardia by electrocardiogram    BB decreased 11/12/11  . CAD (coronary artery disease)    last cath=01/2008, PCI  . CHF (congestive heart failure) ([place])   . Dyslipidemia   . ED (erectile dysfunction)    viagra helps  . GERD (gastroesophageal reflux disease)   . HTN (hypertension)   . Iron overload 03/15/2014  . 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 Murmur, cardiac    faint early systolic grade 1/6  aortic ejection murmur  . Myocardial infarction ([place])   . Peripheral vascular disease ([place])   . Rib pain 04/16/2014  . SCCA (squamous cell carcinoma) of skin 12/19/2020   Left Malar Cheek Lateral (well diff) (tx p bx)  . SCCA (squamous cell carcinoma) of skin 12/19/2020   Right Scalp Posterior (in situ) (tx p bx)  . SCCA (squamous cell carcinoma) of skin 12/19/2020   Right Scalp Anterior (in situ) (tx p bx)  . Skin cancer    basal cell ca X 4    Past Surgical History:  Procedure Laterality Date  . ABDOMINAL AORTIC ENDOVASCULAR STENT GRAFT N/A 01/13/2018   Procedure: ABDOMINAL AORTIC ENDOVASCULAR STENT GRAFT;  Surgeon: Angelia Mould, MD;  Location: [facility] OR;  Service: Vascular;  Laterality: N/A;  . CORONARY ANGIOPLASTY WITH STENT PLACEMENT  01/31/2008   PCI with cutting balloon arthrectomy of the ostium of the circ obtuse marg 2 vessel  . CORONARY ANGIOPLASTY WITH STENT PLACEMENT  01/27/08   nl LV function, PTCA/stenting of LAD with 3x15mm Cypher post dilated 3.36mm  .  EYE SURGERY     cataract bil  . FRACTURE SURGERY     right elbow when 75 years old  . HERNIA REPAIR    . KNEE ARTHROSCOPY Left   . MOHS SURGERY     x4  . NASAL SEPTUM SURGERY     x2  . TOTAL KNEE ARTHROPLASTY Left 11/08/2018   Procedure: TOTAL KNEE ARTHROPLASTY;  Surgeon: Renette Butters, MD;  Location: [facility] ORS;  Service: Orthopedics;  Laterality: Left;    Allergies  Allergen Reactions  . Lisinopril Other (See Comments)    PHOTOSENSITIVITY ON ARMS  . Minocin [Minocycline Hcl]     Flank pain  . Sulfa Antibiotics     Drop in BP  . Thiazide-Type Diuretics Other (See Comments)    "Lowered my blood pressure"  . Codeine Nausea Only    Current Outpatient Medications  Medication Sig Dispense Refill  . amLODipine (NORVASC) 2.5 MG tablet TAKE ONE TABLET BY MOUTH DAILY 90 tablet 0  . AMOXICILLIN PO Take by mouth. Take prior to dental work.    Marland Kitchen apixaban (ELIQUIS) 5 MG TABS tablet Take 1 tablet (5 mg total) by  mouth 2 (two) times daily. 60 tablet 5  . atorvastatin (LIPITOR) 20 MG tablet Take 20 mg by mouth daily.    . bisoprolol (ZEBETA) 10 MG tablet Take 15 mg by mouth daily.     . cephALEXin (KEFLEX) 500 MG capsule Take 1 capsule (500 mg total) by mouth 2 (two) times daily. 20 capsule 0  . cholecalciferol (VITAMIN D) 1000 units tablet Take 1,000 Units by mouth daily.    Marland Kitchen COVID-19 mRNA Vac-TriS, Pfizer, SUSP injection Inject into the muscle. 0.3 mL 0  . Docusate Sodium 100 MG capsule     . hydrocortisone (ANUSOL-HC) 2.5 % rectal cream Place 1 application rectally as needed for hemorrhoids or itching.    . loratadine (CLARITIN) 10 MG tablet Take 10 mg by mouth daily.     . pantoprazole (PROTONIX) 40 MG tablet Take 40 mg by mouth daily.     . Probiotic Product (PROBIOTIC PO) Take 1 tablet by mouth daily.    . sildenafil (VIAGRA) 100 MG tablet     . tacrolimus (PROTOPIC) 0.03 % ointment Apply 1 application topically daily as needed (dermatitis on face).     . tamsulosin (FLOMAX) 0.4 MG CAPS capsule Take 0.4 mg by mouth daily.     No current facility-administered medications for this visit.    Family History  Problem Relation Age of Onset  . Heart failure Mother        heart disease  . Heart disease Mother   . Heart failure Father        heart disease  . Heart disease Father   . Heart disease Brother   . Heart failure Brother        "bad heart valve"    Social History   Socioeconomic History  . Marital status: Widowed    Spouse name: Not on file  . Number of children: Not on file  . Years of education: Not on file  . Highest education level: Not on file  Occupational History  . Occupation: Pharmacist, community  Tobacco Use  . Smoking status: Former Smoker    Quit date: 10/31/1982    Years since quitting: 38.2  . Smokeless tobacco: Never Used  Vaping Use  . Vaping Use: Never used  Substance and Sexual Activity  . Alcohol use: Yes    Alcohol/week:  28.0 standard drinks     Types: 28 Cans of beer per week    Comment: "3-4 beers or hard apple ciders/ day" 01/10/2018  . Drug use: No  . Sexual activity: Not Currently  Other Topics Concern  . Not on file  Social History Narrative  . Not on file   Social Determinants of Health   Financial Resource Strain: Not on file  Food Insecurity: Not on file  Transportation Needs: Not on file  Physical Activity: Not on file  Stress: Not on file  Social Connections: Not on file  Intimate Partner Violence: Not on file     REVIEW OF SYSTEMS:   [X]  denotes positive finding, [ ]  denotes negative finding Cardiac  Comments:  Chest pain or chest pressure:    Shortness of breath upon exertion:    Short of breath when lying flat:    Irregular heart rhythm:        Vascular    Pain in calf, thigh, or hip brought on by ambulation:    Pain in feet at night that wakes you up from your sleep:     Blood clot in your veins:    Leg swelling:         Pulmonary    Oxygen at home:    Productive cough:     Wheezing:         Neurologic    Sudden weakness in arms or legs:     Sudden numbness in arms or legs:     Sudden onset of difficulty speaking or slurred speech:    Temporary loss of vision in one eye:     Problems with dizziness:         Gastrointestinal    Blood in stool:     Vomited blood:         Genitourinary    Burning when urinating:     Blood in urine:        Psychiatric    Major depression:         Hematologic    Bleeding problems:    Problems with blood clotting too easily:        Skin    Rashes or ulcers:        Constitutional    Fever or chills:      PHYSICAL EXAMINATION:  Today's Vitals   01/16/21 0943  BP: 133/85  Pulse: 65  Resp: 20  Temp: 98.2 F (36.8 C)  TempSrc: Temporal  SpO2: 99%  Weight: 142 lb 3.2 oz (64.5 kg)  Height: 5\' 4"  (1.626 m)   Body mass index is 24.41 kg/m.   General:  WDWN in NAD; vital signs documented above Gait: Not observed HENT: WNL,  normocephalic Pulmonary: normal non-labored breathing , without wheezing Cardiac: irregular HR, without  Murmur; without carotid bruits Abdomen: soft, NT, no masses; aortic pulse is not palpable Skin: without rashes Vascular Exam/Pulses:  Right Left  Radial 2+ (normal) 2+ (normal)  Femoral 2+ (normal) 2+ (normal)  Popliteal Unable to palpate Unable to palpate  DP 2+ (normal) 2+ (normal)  PT 2+ (normal) 2+ (normal)   Extremities: without ischemic changes, without Gangrene , without cellulitis; without open wounds;  Musculoskeletal: no muscle wasting or atrophy  Neurologic: A&O X 3;  No focal weakness or paresthesias are detected Psychiatric:  The pt has Normal affect.   Non-Invasive Vascular Imaging:   EVAR Arterial duplex on 01/16/2021: Abdominal Aorta Findings:  +--------+-------+----------+----------+--------+--------+--------+  LocationAP (cm)Trans (cm)PSV (cm/s)WaveformThrombusComments  +--------+-------+----------+----------+--------+--------+--------+  Proximal1.90  2.02   87                  +--------+-------+----------+----------+--------+--------+--------+    Endovascular Aortic Repair (EVAR):  +----------+----------------+-------------------+-------------------+       Diameter AP (cm)Diameter Trans (cm)Velocities (cm/sec)  +----------+----------------+-------------------+-------------------+  Aorta   2.99      3.67        35           +----------+----------------+-------------------+-------------------+  Right Limb0.94      0.97        83           +----------+----------------+-------------------+-------------------+  Left Limb 0.92      1.03        50           +----------+----------------+-------------------+-------------------+   +-------------+------------------------------------------------------------  Endoleak TypeFlow is detectable  by Doppler adjacent to the main body of The stent graft consistent with a type 1 endoleak.         +-------------+------------------------------------------------------------   Summary:  Abdominal Aorta: Patent endovascular aneurysm repair with evidence of endoleak. Imaging is consistent with a type 1 endoleak from the proximal end of the main body of the stent graft.   Previous EVAR arterial duplex on 12/27/2019: Endovascular Aortic Repair (EVAR):  +----------+----------------+-------------------+-------------------+       Diameter AP (cm)Diameter Trans (cm)Velocities (cm/sec)  +----------+----------------+-------------------+-------------------+  Aorta   2.82                48           +----------+----------------+-------------------+-------------------+  Right Limb0.98      1.00        96           +----------+----------------+-------------------+-------------------+  Left Limb 0.96      0.96        130          +----------+----------------+-------------------+-------------------+   Summary:  Abdominal Aorta: Patent endovascular aneurysm repair with no evidence of endoleak. The largest aortic diameter remains essentially unchanged compared to prior exam. Previous diameter measurement was 2.8 cm obtained on 03/08/2019.   ASSESSMENT/PLAN:: 75 y.o. male here for follow up for follow up for hx of underwent repair of a 3.2 cm saccular aneurysm on 01/13/2018 by Dr. Scot Dock  -pt doing well overall.  His duplex today reveals a new type one endoleak at the proximal end of the graft and the aorta has increased in size at 3.6cm.  Pt seen with Dr. Scot Dock and pt will need urgent CTA to evaluate further.  Will get this in the next 1-2 weeks and see Dr. Scot Dock back.  Pt has vacation at the end of June and feels he needs to cancel this so he does not loose his deposit.   -continue statin.  -pt is on  Eliquis for afib   Leontine Locket, [facility] [phone]  Clinic MD:   Scot Dock

## 2021-01-16 ENCOUNTER — Ambulatory Visit (HOSPITAL_COMMUNITY)
Admission: RE | Admit: 2021-01-16 | Discharge: 2021-01-16 | Disposition: A | Discharge status: Home / Self Care | Payer: PPO | Source: Ambulatory Visit | Attending: Vascular Surgery | Admitting: Vascular Surgery

## 2021-01-16 ENCOUNTER — Other Ambulatory Visit: Payer: Self-pay

## 2021-01-16 ENCOUNTER — Other Ambulatory Visit: Payer: Self-pay | Admitting: *Deleted

## 2021-01-16 ENCOUNTER — Ambulatory Visit: Payer: PPO | Admitting: Physician Assistant

## 2021-01-16 VITALS — BP 133/85 | HR 65 | Temp 98.2°F | Resp 20 | Ht 64.0 in | Wt 142.2 lb

## 2021-01-16 DIAGNOSIS — I714 Abdominal aortic aneurysm, without rupture, unspecified: Secondary | ICD-10-CM

## 2021-01-20 ENCOUNTER — Telehealth: Payer: Self-pay | Admitting: Cardiovascular Disease

## 2021-01-20 MED ORDER — ELIQUIS 5 MG PO TABS: 5.0000 mg | ORAL_TABLET | Freq: Two times a day (BID) | ORAL | 1 refills | Status: DC

## 2021-01-20 NOTE — Telephone Encounter (Signed)
74m, 64.5kg, scr 0.93 08/07/20, lovw/kilroy 08/13/20

## 2021-01-20 NOTE — Telephone Encounter (Signed)
*  STAT* If patient is at the pharmacy, call can be transferred to refill team.   1. Which medications need to be refilled? (please list name of each medication and dose if known) apixaban (ELIQUIS) 5 MG TABS tablet  2. Which pharmacy/location (including street and city if local pharmacy) is medication to be sent to? [facility] - [place], [place] - [place] [address]  3. Do they need a 30 day or 90 day supply? 90 day   Patient is out of medication

## 2021-01-24 ENCOUNTER — Ambulatory Visit
Admission: RE | Admit: 2021-01-24 | Discharge: 2021-01-24 | Disposition: A | Discharge status: Home / Self Care | Payer: PPO | Source: Ambulatory Visit | Attending: Vascular Surgery | Admitting: Vascular Surgery

## 2021-01-24 ENCOUNTER — Other Ambulatory Visit: Payer: Self-pay

## 2021-01-24 DIAGNOSIS — K4090 Unilateral inguinal hernia, without obstruction or gangrene, not specified as recurrent: Secondary | ICD-10-CM | POA: Diagnosis not present

## 2021-01-24 DIAGNOSIS — I712 Thoracic aortic aneurysm, without rupture: Secondary | ICD-10-CM | POA: Diagnosis not present

## 2021-01-24 DIAGNOSIS — I714 Abdominal aortic aneurysm, without rupture, unspecified: Secondary | ICD-10-CM

## 2021-01-24 DIAGNOSIS — I723 Aneurysm of iliac artery: Secondary | ICD-10-CM | POA: Diagnosis not present

## 2021-01-24 MED ORDER — IOPAMIDOL (ISOVUE-370) INJECTION 76%
75.0000 mL | Freq: Once | INTRAVENOUS | Status: AC | PRN
  Administered 2021-01-24: 75 mL via INTRAVENOUS

## 2021-01-29 ENCOUNTER — Ambulatory Visit: Payer: PPO | Admitting: Vascular Surgery

## 2021-02-07 DIAGNOSIS — I4891 Unspecified atrial fibrillation: Secondary | ICD-10-CM | POA: Diagnosis not present

## 2021-02-07 DIAGNOSIS — I2510 Atherosclerotic heart disease of native coronary artery without angina pectoris: Secondary | ICD-10-CM | POA: Diagnosis not present

## 2021-02-07 DIAGNOSIS — R809 Proteinuria, unspecified: Secondary | ICD-10-CM | POA: Diagnosis not present

## 2021-02-07 DIAGNOSIS — E7800 Pure hypercholesterolemia, unspecified: Secondary | ICD-10-CM | POA: Diagnosis not present

## 2021-02-07 DIAGNOSIS — I10 Essential (primary) hypertension: Secondary | ICD-10-CM | POA: Diagnosis not present

## 2021-02-07 DIAGNOSIS — D649 Anemia, unspecified: Secondary | ICD-10-CM | POA: Diagnosis not present

## 2021-02-07 DIAGNOSIS — M1712 Unilateral primary osteoarthritis, left knee: Secondary | ICD-10-CM | POA: Diagnosis not present

## 2021-02-07 DIAGNOSIS — K219 Gastro-esophageal reflux disease without esophagitis: Secondary | ICD-10-CM | POA: Diagnosis not present

## 2021-02-07 DIAGNOSIS — N400 Enlarged prostate without lower urinary tract symptoms: Secondary | ICD-10-CM | POA: Diagnosis not present

## 2021-02-17 DIAGNOSIS — I2510 Atherosclerotic heart disease of native coronary artery without angina pectoris: Secondary | ICD-10-CM | POA: Diagnosis not present

## 2021-02-17 DIAGNOSIS — M1712 Unilateral primary osteoarthritis, left knee: Secondary | ICD-10-CM | POA: Diagnosis not present

## 2021-02-17 DIAGNOSIS — N400 Enlarged prostate without lower urinary tract symptoms: Secondary | ICD-10-CM | POA: Diagnosis not present

## 2021-02-17 DIAGNOSIS — E7800 Pure hypercholesterolemia, unspecified: Secondary | ICD-10-CM | POA: Diagnosis not present

## 2021-02-17 DIAGNOSIS — I10 Essential (primary) hypertension: Secondary | ICD-10-CM | POA: Diagnosis not present

## 2021-02-17 DIAGNOSIS — K219 Gastro-esophageal reflux disease without esophagitis: Secondary | ICD-10-CM | POA: Diagnosis not present

## 2021-02-17 DIAGNOSIS — D649 Anemia, unspecified: Secondary | ICD-10-CM | POA: Diagnosis not present

## 2021-02-17 DIAGNOSIS — I4891 Unspecified atrial fibrillation: Secondary | ICD-10-CM | POA: Diagnosis not present

## 2021-02-18 DIAGNOSIS — S0502XA Injury of conjunctiva and corneal abrasion without foreign body, left eye, initial encounter: Secondary | ICD-10-CM | POA: Diagnosis not present

## 2021-02-20 DIAGNOSIS — Z961 Presence of intraocular lens: Secondary | ICD-10-CM | POA: Diagnosis not present

## 2021-02-20 DIAGNOSIS — H16142 Punctate keratitis, left eye: Secondary | ICD-10-CM | POA: Diagnosis not present

## 2021-02-20 DIAGNOSIS — S0502XA Injury of conjunctiva and corneal abrasion without foreign body, left eye, initial encounter: Secondary | ICD-10-CM | POA: Diagnosis not present

## 2021-02-20 DIAGNOSIS — H10403 Unspecified chronic conjunctivitis, bilateral: Secondary | ICD-10-CM | POA: Diagnosis not present

## 2021-02-20 DIAGNOSIS — H11043 Peripheral pterygium, stationary, bilateral: Secondary | ICD-10-CM | POA: Diagnosis not present

## 2021-02-20 DIAGNOSIS — H11823 Conjunctivochalasis, bilateral: Secondary | ICD-10-CM | POA: Diagnosis not present

## 2021-02-24 ENCOUNTER — Other Ambulatory Visit: Payer: Self-pay | Admitting: Vascular Surgery

## 2021-02-24 DIAGNOSIS — I9789 Other postprocedural complications and disorders of the circulatory system, not elsewhere classified: Secondary | ICD-10-CM

## 2021-02-26 ENCOUNTER — Ambulatory Visit: Payer: PPO | Admitting: Cardiovascular Disease

## 2021-03-03 NOTE — Progress Notes (Signed)
Cardiology Office Note   Date:  03/07/2021   ID:  Corey Garcia, DOB Feb 14, 1946, MRN [id number]  PCP:  Pcp, No  Cardiologist:  Dr.Xayvier Vallez   Chief Complaint  Patient presents with   Coronary Artery Disease   Atrial Fibrillation      History of Present Illness: Corey Garcia is a 75 y.o. male with CAD, vasovagal syncope, AAA s/p EVAR (Dr. Scot Dock), hypertension, hyperlipidemia, chronic liver disease. He has not required coronary intervention since November 21, 2007 when he had cutting balloon atherectomy of the OM2 and DES to the LAD, residual 50% RCA stenosis. He had a low risk nuclear study April 2014.  His EVAR ultrasound follow-up in May 2022 still shows a suspected small type II endoleak, without any meaningful change in the size of the aneurysm sac at 3.2 cm  Although he has not had any serious cardiovascular problems, its been a tough year.  His wife and then his sister passed away in 11/21/2022.  He is living with his grandson.  The patient specifically denies any chest pain at rest exertion, dyspnea at rest or with exertion, orthopnea, paroxysmal nocturnal dyspnea, syncope, palpitations, focal neurological deficits, intermittent claudication, lower extremity edema, unexplained weight gain, cough, hemoptysis or wheezing.  He does complain of orthostatic dizziness, especially when he first gets out of bed first thing in the morning.  He is taking amlodipine for hypertension and a very low-dose also takes a relatively high dose of bisoprolol for atrial fibrillation rate control and for CAD and is on tamsulosin for urinary difficulties.  He is not taking any diuretics.  He has not had any falls or serious bleeding problems and is compliant with Eliquis.  He denies any focal neurological events.   Past Medical History:  Diagnosis Date   Alcoholism ([place]) 03/15/2014   Anemia    Arthritis    all over, some ra   Bradycardia by electrocardiogram    BB decreased 11/12/11   CAD (coronary artery  disease)    last cath=01/2008, PCI   CHF (congestive heart failure) ([place])    Dyslipidemia    ED (erectile dysfunction)    viagra helps   GERD (gastroesophageal reflux disease)    HTN (hypertension)    Iron overload 03/15/2014   Liver disease due to alcohol ([place]) 03/15/2014   pt .denies at preop   Murmur, cardiac    faint early systolic grade 1/6 aortic ejection murmur   Myocardial infarction [facility])    Peripheral vascular disease (HCC)    Rib pain 04/16/2014   SCCA (squamous cell carcinoma) of skin 12/19/2020   Left [place] Cheek Lateral (well diff) (tx p bx)   SCCA (squamous cell carcinoma) of skin 12/19/2020   Right Scalp Posterior (in situ) (tx p bx)   SCCA (squamous cell carcinoma) of skin 12/19/2020   Right Scalp Anterior (in situ) (tx p bx)   Skin cancer    basal cell ca X 4    Past Surgical History:  Procedure Laterality Date   ABDOMINAL AORTIC ENDOVASCULAR STENT GRAFT N/A 01/13/2018   Procedure: ABDOMINAL AORTIC ENDOVASCULAR STENT GRAFT;  Surgeon: Angelia Mould, MD;  Location: [place];  Service: Vascular;  Laterality: N/A;   CORONARY ANGIOPLASTY WITH STENT PLACEMENT  01/31/2008   PCI with cutting balloon arthrectomy of the ostium of the circ obtuse marg 2 vessel   CORONARY ANGIOPLASTY WITH STENT PLACEMENT  01/27/08   nl LV function, PTCA/stenting of LAD with 3x72mm Cypher post dilated 3.46mm   EYE  SURGERY     cataract bil   FRACTURE SURGERY     right elbow when 75 years old   HERNIA REPAIR     KNEE ARTHROSCOPY Left    MOHS SURGERY     x4   NASAL SEPTUM SURGERY     x2   TOTAL KNEE ARTHROPLASTY Left 11/08/2018   Procedure: TOTAL KNEE ARTHROPLASTY;  Surgeon: Renette Butters, MD;  Location: [facility];  Service: Orthopedics;  Laterality: Left;     Current Outpatient Medications  Medication Sig Dispense Refill   AMOXICILLIN PO Take by mouth. Take prior to dental work.     apixaban (ELIQUIS) 5 MG TABS tablet Take 1 tablet (5 mg total) by mouth 2 (two) times daily. 180  tablet 1   atorvastatin (LIPITOR) 20 MG tablet Take 20 mg by mouth daily.     bisoprolol (ZEBETA) 10 MG tablet Take 15 mg by mouth daily.      cholecalciferol (VITAMIN D) 1000 units tablet Take 1,000 Units by mouth daily.     COVID-19 mRNA Vac-TriS, Pfizer, SUSP injection Inject into the muscle. 0.3 mL 0   Docusate Sodium 100 MG capsule      fluorometholone (FML) 0.1 % ophthalmic suspension 1 drop 4 (four) times daily.     hydrocortisone (ANUSOL-HC) 2.5 % rectal cream Place 1 application rectally as needed for hemorrhoids or itching.     loratadine (CLARITIN) 10 MG tablet Take 10 mg by mouth daily.      pantoprazole (PROTONIX) 40 MG tablet Take 40 mg by mouth daily.      Probiotic Product (PROBIOTIC PO) Take 1 tablet by mouth daily.     sildenafil (VIAGRA) 100 MG tablet      tamsulosin (FLOMAX) 0.4 MG CAPS capsule Take 0.4 mg by mouth daily.     No current facility-administered medications for this visit.    Allergies:   Lisinopril, Minocin [minocycline hcl], Sulfa antibiotics, Thiazide-type diuretics, and Codeine    Social History:  The patient  reports that he quit smoking about 38 years ago. His smoking use included cigarettes. He has never used smokeless tobacco. He reports current alcohol use of about 28.0 standard drinks of alcohol per week. He reports that he does not use drugs.   Family History:  The patient's family history includes Heart disease in his brother, father, and mother; Heart failure in his brother, father, and mother.    ROS: Please see history of present illness. All other systems are reviewed and are negative.  PHYSICAL EXAM: VS:  BP 100/70 (BP Location: Right Arm)   Pulse 77   Ht 5\' 4"  (1.626 m)   Wt 140 lb 3.2 oz (63.6 kg)   BMI 24.07 kg/m  , BMI Body mass index is 24.07 kg/m.     General: Alert, oriented x3, no distress, he is very quiet, even more so than usual Head: no evidence of trauma, PERRL, EOMI, no exophtalmos or lid lag, no myxedema, no  xanthelasma; normal ears, nose and oropharynx Neck: normal jugular venous pulsations and no hepatojugular reflux; brisk carotid pulses without delay and faint bilateral carotid bruits, that appear to radiate from the chest Chest: clear to auscultation, no signs of consolidation by percussion or palpation, normal fremitus, symmetrical and full respiratory excursions Cardiovascular: normal position and quality of the apical impulse, regular rhythm, normal first and second heart sounds, 2/6 early to mid peaking aortic ejection murmur radiating towards the carotids, no apical systolic murmurs, no diastolic murmurs, rubs or  gallops Abdomen: no tenderness or distention, no masses by palpation, no abnormal pulsatility or arterial bruits, normal bowel sounds, no hepatosplenomegaly Extremities: no clubbing, cyanosis or edema; 2+ radial, ulnar and brachial pulses bilaterally; 2+ right femoral, posterior tibial and dorsalis pedis pulses; 2+ left femoral, posterior tibial and dorsalis pedis pulses; no subclavian or femoral bruits Neurological: grossly nonfocal Psych: Normal mood and affect   AAA Duplex April 2021: Summary: Abdominal Aorta: Patent endovascular aneurysm repair with no evidence of endoleak. The largest aortic diameter remains essentially unchanged compared to prior exam. Previous diameter measurement was 2.8 cm obtained on 03/08/2019.  ECHO 07/19/2019:  1. Left ventricular ejection fraction, by visual estimation, is 60 to  65%. The left ventricle has normal function. There is no left ventricular  hypertrophy.   2. Left ventricular diastolic parameters are consistent with Grade II  diastolic dysfunction (pseudonormalization).   3. Global right ventricle has normal systolic function.The right  ventricular size is normal. No increase in right ventricular wall  thickness.   4. Left atrial size was mildly dilated.   5. Right atrial size was normal.   6. Mild mitral annular calcification.   7.  The mitral valve is normal in structure. No evidence of mitral valve  regurgitation. No evidence of mitral stenosis.   8. The tricuspid valve is normal in structure. Tricuspid valve  regurgitation is not demonstrated.   9. The aortic valve was not well visualized. Aortic valve regurgitation  is trivial. Mild to moderate aortic valve stenosis.  10. The pulmonic valve was normal in structure. Pulmonic valve  regurgitation is not visualized.  11. Normal pulmonary artery systolic pressure.  12. The inferior vena cava is normal in size with greater than 50%  respiratory variability, suggesting right atrial pressure of 3 mmHg.    CT Angio 01/24/2021 Stable infrarenal saccular aneurysm diameter following aortic stent graft repair at 3.2 cm.   Slight interval increased contrast enhancement of the anterior inferior aspect of the small aortic saccular aneurysm compatible with a persistent endoleak without clear source demonstrated. Type 2 endoleak is favored as detailed above.   Stable aneurysmal disease of the right common iliac artery, maximal diameter 2.5 cm.   NON-VASCULAR   No other acute intra-abdominal or pelvic finding by CTA.  EKG: ordered today, personally reviewed shows atrial fibrillation with controlled ventricular rate, QS pattern in leads V1-V2 without any ST or T wave changes, normal QTC 411 ms.   Recent Labs: 07/18/2020: ALT 28; BUN 13; Creatinine, Ser 0.95; Hemoglobin 12.4; Platelets 185; Potassium 4.2; Sodium 133  June 16, 2018 Dr. Drema [place]: Total cholesterol 142, HDL 48, LDL 76, triglycerides 90 Hemoglobin of 11.0, creatinine 0.96  June 23, 2019 Creatinine 0.95, potassium 4.3, hemoglobin 12.0  Lipid Panel    Component Value Date/Time   CHOL 142 07/18/2020 0930   TRIG 66 07/18/2020 0930   HDL 67 07/18/2020 0930   CHOLHDL 2.1 07/18/2020 0930   CHOLHDL 3.0 01/27/2008 0652   VLDL 10 01/27/2008 0652   LDLCALC 62 07/18/2020 0930      Wt Readings from Last 3  Encounters:  03/06/21 140 lb 3.2 oz (63.6 kg)  01/16/21 142 lb 3.2 oz (64.5 kg)  08/13/20 143 lb (64.9 kg)     1. Atrial fibrillation, unspecified type ([place])   2. Coronary artery disease involving native coronary artery of native heart with other form of angina pectoris ([place])   3. Pure hypercholesterolemia   4. Essential hypertension   5. Aortic valve stenosis, nonrheumatic  6. Abdominal aortic aneurysm (AAA) without rupture ([place])       ASSESSMENT AND PLAN:  1. Afib: It seems that he has longstanding, persistent atrial fibrillation, probably a permanent arrhythmia at this point.  He remains arrhythmia unaware and asymptomatic from this point of view.  Ventricular rate control is good on the relatively high dose of bisoprolol.   CHA2DS2-VASc score is at least 3 (age, hypertension, CAD/PAD) .  Compliant with anticoagulation.  Antiarrhythmics do not appear to be indicated, nor is cardioversion. 2. CAD: Asymptomatic.  Not on aspirin due to full anticoagulation.  On beta-blocker and statin..  Last evaluation was a low risk nuclear stress test in 2014, no revascularization since 2009.  Normal left ventricular ejection fraction by echo in 2020. 3. HLP: All lipid parameters are excellent, continue current statin dose. 4. HTN: Blood pressure is borderline low and he has complaints of orthostatic dizziness.  The beta-blocker is necessary for rate control and for CAD and he has difficulty with urination unless he takes his alpha-blocker.  We will discontinue the amlodipine.  It is possible that his dizziness and his tendency to hypotension may indicate progression of his aortic stenosis. 5. AS: We will recheck his echocardiogram to assess for progression of aortic stenosis. 6. AAA s/p EVAR: reviewed CT angio results from May 2022 and [place] April 2021. Seeing Dr. Scot Dock.   Patient Instructions  Medication Instructions:  STOP the Amlodipine  *If you need a refill on your cardiac medications before  your next appointment, please call your pharmacy*   Lab Work: None ordered If you have labs (blood work) drawn today and your tests are completely normal, you will receive your results only by: [place] (if you have MyChart) OR A paper copy in the mail If you have any lab test that is abnormal or we need to change your treatment, we will call you to review the results.   Testing/Procedures: Your physician has requested that you have an echocardiogram. Echocardiography is a painless test that uses sound waves to create images of your heart. It provides your doctor with information about the size and shape of your heart and how well your heart's chambers and valves are working. You may receive an ultrasound enhancing agent through an IV if needed to better visualize your heart during the echo.This procedure takes approximately one hour. There are no restrictions for this procedure. This will take place at the [address], [address].   Follow-Up: At [facility], you and your health needs are our priority.  As part of our continuing mission to provide you with exceptional heart care, we have created designated Provider Care Teams.  These Care Teams include your primary Cardiologist (physician) and Advanced Practice Providers (APPs -  Physician Assistants and Nurse Practitioners) who all work together to provide you with the care you need, when you need it.  We recommend signing up for the patient portal called "MyChart".  Sign up information is provided on this After Visit Summary.  MyChart is used to connect with patients for Virtual Visits (Telemedicine).  Patients are able to view lab/test results, encounter notes, upcoming appointments, etc.  Non-urgent messages can be sent to your provider as well.   To learn more about what you can do with MyChart, go to [email or web address].    Your next appointment:   6 month(s)  The format for your next appointment:   In  Person  Provider:   You may see Sanda Klein, MD  or one of the following Advanced Practice Providers on your designated Care Team:   Almyra Deforest, PA-C Fabian Sharp, PA-C or  Roby Lofts, PA-C    Sanda Klein, MD, [facility] [phone] office [phone] pager

## 2021-03-06 ENCOUNTER — Other Ambulatory Visit: Payer: Self-pay

## 2021-03-06 ENCOUNTER — Encounter: Payer: Self-pay | Admitting: Cardiovascular Disease

## 2021-03-06 ENCOUNTER — Ambulatory Visit: Payer: PPO | Admitting: Cardiovascular Disease

## 2021-03-06 VITALS — BP 100/70 | HR 77 | Ht 64.0 in | Wt 140.2 lb

## 2021-03-06 DIAGNOSIS — I714 Abdominal aortic aneurysm, without rupture, unspecified: Secondary | ICD-10-CM

## 2021-03-06 DIAGNOSIS — E7800 Pure hypercholesterolemia, unspecified: Secondary | ICD-10-CM | POA: Diagnosis not present

## 2021-03-06 DIAGNOSIS — I350 Nonrheumatic aortic (valve) stenosis: Secondary | ICD-10-CM

## 2021-03-06 DIAGNOSIS — I10 Essential (primary) hypertension: Secondary | ICD-10-CM | POA: Diagnosis not present

## 2021-03-06 DIAGNOSIS — I25118 Atherosclerotic heart disease of native coronary artery with other forms of angina pectoris: Secondary | ICD-10-CM | POA: Diagnosis not present

## 2021-03-06 DIAGNOSIS — I4891 Unspecified atrial fibrillation: Secondary | ICD-10-CM | POA: Diagnosis not present

## 2021-03-06 NOTE — Patient Instructions (Signed)
Medication Instructions:  STOP the Amlodipine  *If you need a refill on your cardiac medications before your next appointment, please call your pharmacy*   Lab Work: None ordered If you have labs (blood work) drawn today and your tests are completely normal, you will receive your results only by: [place] (if you have MyChart) OR A paper copy in the mail If you have any lab test that is abnormal or we need to change your treatment, we will call you to review the results.   Testing/Procedures: Your physician has requested that you have an echocardiogram. Echocardiography is a painless test that uses sound waves to create images of your heart. It provides your doctor with information about the size and shape of your heart and how well your heart's chambers and valves are working. You may receive an ultrasound enhancing agent through an IV if needed to better visualize your heart during the echo.This procedure takes approximately one hour. There are no restrictions for this procedure. This will take place at the [address].   Follow-Up: At [facility], you and your health needs are our priority.  As part of our continuing mission to provide you with exceptional heart care, we have created designated Provider Care Teams.  These Care Teams include your primary Cardiologist (physician) and Advanced Practice Providers (APPs -  Physician Assistants and Nurse Practitioners) who all work together to provide you with the care you need, when you need it.  We recommend signing up for the patient portal called "MyChart".  Sign up information is provided on this After Visit Summary.  MyChart is used to connect with patients for Virtual Visits (Telemedicine).  Patients are able to view lab/test results, encounter notes, upcoming appointments, etc.  Non-urgent messages can be sent to your provider as well.   To learn more about what you can do with MyChart, go to  [email or web address].    Your next appointment:   6 month(s)  The format for your next appointment:   In Person  Provider:   You may see Sanda Klein, MD or one of the following Advanced Practice Providers on your designated Care Team:   Almyra Deforest, PA-C Fabian Sharp, PA-C or  Roby Lofts, [place]

## 2021-03-07 ENCOUNTER — Encounter: Payer: Self-pay | Admitting: Cardiovascular Disease

## 2021-03-12 DIAGNOSIS — D649 Anemia, unspecified: Secondary | ICD-10-CM | POA: Diagnosis not present

## 2021-03-12 DIAGNOSIS — I2510 Atherosclerotic heart disease of native coronary artery without angina pectoris: Secondary | ICD-10-CM | POA: Diagnosis not present

## 2021-03-12 DIAGNOSIS — K219 Gastro-esophageal reflux disease without esophagitis: Secondary | ICD-10-CM | POA: Diagnosis not present

## 2021-03-12 DIAGNOSIS — M1712 Unilateral primary osteoarthritis, left knee: Secondary | ICD-10-CM | POA: Diagnosis not present

## 2021-03-12 DIAGNOSIS — E7800 Pure hypercholesterolemia, unspecified: Secondary | ICD-10-CM | POA: Diagnosis not present

## 2021-03-12 DIAGNOSIS — I10 Essential (primary) hypertension: Secondary | ICD-10-CM | POA: Diagnosis not present

## 2021-03-12 DIAGNOSIS — I4891 Unspecified atrial fibrillation: Secondary | ICD-10-CM | POA: Diagnosis not present

## 2021-03-12 DIAGNOSIS — N400 Enlarged prostate without lower urinary tract symptoms: Secondary | ICD-10-CM | POA: Diagnosis not present

## 2021-03-20 DIAGNOSIS — H11823 Conjunctivochalasis, bilateral: Secondary | ICD-10-CM | POA: Diagnosis not present

## 2021-03-20 DIAGNOSIS — H10403 Unspecified chronic conjunctivitis, bilateral: Secondary | ICD-10-CM | POA: Diagnosis not present

## 2021-03-20 DIAGNOSIS — H11043 Peripheral pterygium, stationary, bilateral: Secondary | ICD-10-CM | POA: Diagnosis not present

## 2021-03-20 DIAGNOSIS — Z961 Presence of intraocular lens: Secondary | ICD-10-CM | POA: Diagnosis not present

## 2021-03-26 ENCOUNTER — Ambulatory Visit: Payer: PPO | Admitting: Physician Assistant

## 2021-03-26 ENCOUNTER — Other Ambulatory Visit: Payer: Self-pay

## 2021-03-26 ENCOUNTER — Ambulatory Visit (HOSPITAL_COMMUNITY): Payer: PPO | Attending: Internal Medicine

## 2021-03-26 DIAGNOSIS — I350 Nonrheumatic aortic (valve) stenosis: Secondary | ICD-10-CM | POA: Diagnosis not present

## 2021-03-26 LAB — ECHOCARDIOGRAM COMPLETE
AR max vel: 0.79 cm2
AV Area VTI: 0.83 cm2
AV Area mean vel: 0.78 cm2
AV Mean grad: 10.5 mmHg
AV Peak grad: 19 mmHg
Ao pk vel: 2.18 m/s
S' Lateral: 2.8 cm

## 2021-03-27 ENCOUNTER — Ambulatory Visit
Admission: RE | Admit: 2021-03-27 | Discharge: 2021-03-27 | Disposition: A | Discharge status: Home / Self Care | Payer: PPO | Source: Ambulatory Visit | Attending: Vascular Surgery | Admitting: Vascular Surgery

## 2021-03-27 DIAGNOSIS — I9789 Other postprocedural complications and disorders of the circulatory system, not elsewhere classified: Secondary | ICD-10-CM | POA: Diagnosis not present

## 2021-03-27 HISTORY — PX: IR RADIOLOGIST EVAL & MGMT: IMG5224

## 2021-03-31 NOTE — Consult Note (Signed)
Chief Complaint: Patient was seen in consultation today for aortic endoleak at the request of [place]  Referring Physician(s): Dickson,Christopher S  History of Present Illness: Corey Garcia is a 75 y.o. male status post EVAR to treat a 3.2 cm saccular abdominal aortic aneurysm on 01/13/2018 with placement of a 10 cm length covered graft without iliac extension. Original saccular outpouching was anterior and directed to the right, approximately 1.5-1.7 cm above the IMA origin.  Initial post-op CTA on 02/22/2018 showed maximal aneurysm sac size of approximately 3.3 cm with thrombosis of most of the saccular aneurysm and focal endoleak present felt to most likely represent a type II endoleak. CTA on 09/20/2018 demonstrated stable sac size of 3.3 cm and persistent endoleak, felt to be most likely from retrograde IMA supply based on location of perfusion and the original aneurysm. CTA on 01/24/2021 shows stable to smaller sac size of 3.2 cm with persistent endoleak which appears slightly more prominent in size on the arterial phase.  Mr. Berti has remained asymptomatic since EVAR and denies abdominal pain, pelvic pain or claudication.  Past Medical History:  Diagnosis Date   Alcoholism ([place]) 03/15/2014   Anemia    Arthritis    all over, some ra   Bradycardia by electrocardiogram    BB decreased 11/12/11   CAD (coronary artery disease)    last cath=01/2008, PCI   CHF (congestive heart failure) ([place])    Dyslipidemia    ED (erectile dysfunction)    viagra helps   GERD (gastroesophageal reflux disease)    HTN (hypertension)    Iron overload 03/15/2014   Liver disease due to alcohol ([place]) 03/15/2014   pt .denies at preop   Murmur, cardiac    faint early systolic grade 1/6 aortic ejection murmur   Myocardial infarction [facility])    Peripheral vascular disease (HCC)    Rib pain 04/16/2014   SCCA (squamous cell carcinoma) of skin 12/19/2020   Left Malar Cheek Lateral (well  diff) (tx p bx)   SCCA (squamous cell carcinoma) of skin 12/19/2020   Right Scalp Posterior (in situ) (tx p bx)   SCCA (squamous cell carcinoma) of skin 12/19/2020   Right Scalp Anterior (in situ) (tx p bx)   Skin cancer    basal cell ca X 4    Past Surgical History:  Procedure Laterality Date   ABDOMINAL AORTIC ENDOVASCULAR STENT GRAFT N/A 01/13/2018   Procedure: ABDOMINAL AORTIC ENDOVASCULAR STENT GRAFT;  Surgeon: Angelia Mould, MD;  Location: [place];  Service: Vascular;  Laterality: N/A;   CORONARY ANGIOPLASTY WITH STENT PLACEMENT  01/31/2008   PCI with cutting balloon arthrectomy of the ostium of the circ obtuse marg 2 vessel   CORONARY ANGIOPLASTY WITH STENT PLACEMENT  01/27/08   nl LV function, PTCA/stenting of LAD with 3x74m Cypher post dilated 3.191m  EYE SURGERY     cataract bil   FRACTURE SURGERY     right elbow when [age]ears old   HESan Jose   IR RADIOLOGIST EVAL & MGMT  03/27/2021   KNEE ARTHROSCOPY Left    MOHS SURGERY     x4   NASAL SEPTUM SURGERY     x2   TOTAL KNEE ARTHROPLASTY Left 11/08/2018   Procedure: TOTAL KNEE ARTHROPLASTY;  Surgeon: MuRenette ButtersMD;  Location: [facility];  Service: Orthopedics;  Laterality: Left;    Allergies: Lisinopril, Minocin [minocycline hcl], Sulfa antibiotics, Thiazide-type diuretics, and Codeine  Medications: Prior to Admission  medications   Medication Sig Start Date End Date Taking? Authorizing Provider  AMOXICILLIN PO Take by mouth. Take prior to dental work.    [provider]  apixaban (ELIQUIS) 5 MG TABS tablet Take 1 tablet (5 mg total) by mouth 2 (two) times daily. 01/20/21   Croitoru, Mihai, MD  atorvastatin (LIPITOR) 20 MG tablet Take 20 mg by mouth daily.    [provider]  bisoprolol (ZEBETA) 10 MG tablet Take 15 mg by mouth daily.  08/22/16   [provider]  cholecalciferol (VITAMIN D) 1000 units tablet Take 1,000 Units by mouth daily.    [provider]  COVID-19  mRNA Vac-TriS, Pfizer, SUSP injection Inject into the muscle. 12/04/20   Carlyle Basques, MD  Docusate Sodium 100 MG capsule     [provider]  fluorometholone (FML) 0.1 % ophthalmic suspension 1 drop 4 (four) times daily. 02/24/21   [provider]  hydrocortisone (ANUSOL-HC) 2.5 % rectal cream Place 1 application rectally as needed for hemorrhoids or itching.    [provider]  loratadine (CLARITIN) 10 MG tablet Take 10 mg by mouth daily.     [provider]  pantoprazole (PROTONIX) 40 MG tablet Take 40 mg by mouth daily.     [provider]  Probiotic Product (PROBIOTIC PO) Take 1 tablet by mouth daily.    [provider]  sildenafil (VIAGRA) 100 MG tablet     [provider]  tamsulosin (FLOMAX) 0.4 MG CAPS capsule Take 0.4 mg by mouth daily.    [provider]     Family History  Problem Relation Age of Onset   Heart failure Mother        heart disease   Heart disease Mother    Heart failure Father        heart disease   Heart disease Father    Heart disease Brother    Heart failure Brother        "bad heart valve"    Social History   Socioeconomic History   Marital status: Widowed    Spouse name: Not on file   Number of children: Not on file   Years of education: Not on file   Highest education level: Not on file  Occupational History   Occupation: Geophysical data processor tech  Tobacco Use   Smoking status: Former    Types: Cigarettes    Quit date: 10/31/1982    Years since quitting: 38.4   Smokeless tobacco: Never  Vaping Use   Vaping Use: Never used  Substance and Sexual Activity   Alcohol use: Yes    Alcohol/week: 28.0 standard drinks    Types: 28 Cans of beer per week    Comment: "3-4 beers or hard apple ciders/ day" 01/10/2018   Drug use: No   Sexual activity: Not Currently  Other Topics Concern   Not on file  Social History Narrative   Not on file   Social Determinants of Health    Financial Resource Strain: Not on file  Food Insecurity: Not on file  Transportation Needs: Not on file  Physical Activity: Not on file  Stress: Not on file  Social Connections: Not on file    Review of Systems: A 12 point ROS discussed and pertinent positives are indicated in the HPI above.  All other systems are negative.  Review of Systems  Constitutional: Negative.   Respiratory: Negative.    Cardiovascular: Negative.   Gastrointestinal: Negative.   Genitourinary: Negative.  Musculoskeletal: Negative.   Neurological: Negative.    Vital Signs: BP (!) 149/84 (BP Location: Right Arm)   Pulse 79   SpO2 98%   Physical Exam Vitals reviewed.  Constitutional:      General: He is not in acute distress.    Appearance: Normal appearance. He is not ill-appearing or toxic-appearing.  Cardiovascular:     Rate and Rhythm: Regular rhythm.     Heart sounds: Normal heart sounds. No murmur heard.   No friction rub. No gallop.  Pulmonary:     Effort: Pulmonary effort is normal. No respiratory distress.     Breath sounds: Normal breath sounds. No stridor. No wheezing, rhonchi or rales.  Abdominal:     General: There is no distension.     Palpations: Abdomen is soft. There is no mass.     Tenderness: There is no abdominal tenderness. There is no guarding or rebound.  Musculoskeletal:        General: No swelling.  Skin:    General: Skin is warm and dry.  Neurological:     General: No focal deficit present.     Mental Status: He is alert and oriented to person, place, and time.    Imaging: ECHOCARDIOGRAM COMPLETE  Result Date: 03/26/2021    ECHOCARDIOGRAM REPORT   Patient Name:   YAKIR WENKE Date of Exam: 03/26/2021 Medical Rec #:  [id number]         Height:       64.0 in Accession #:    [id number]        Weight:       140.2 lb Date of Birth:  01-30-1946          BSA:          1.682 m Patient Age:    7 years          BP:           100/70 mmHg Patient Gender: M                  HR:           90 bpm. Exam Location:  [place] Procedure: 2D Echo, Cardiac Doppler and Color Doppler Indications:    I35.0 Aortic Stenosis  History:        Patient has prior history of Echocardiogram examinations, most                 recent 07/19/2019. Risk Factors:Hypertension and Dyslipidemia.                 Murmur. Myocardial Infarction. Peripheral vascular disease.                 Coronary artery disease. Congestive heart failure.  Sonographer:    Wilford Sports Rodgers-Jones RDCS Referring Phys: [place]  1. The aortic valve is calcified. Aortic valve regurgitation is trivial. Aortic valve mean gradient measures 10.5 mmHg. Aortic valve acceleration time measures 91 msec. DVI 0.29 with diminished LV stroke volume index.  2. Left ventricular ejection fraction, by estimation, is 60 to 65%. The left ventricle has normal function. The left ventricle has no regional wall motion abnormalities. Left ventricular diastolic parameters are indeterminate.  3. Right ventricular systolic function is normal. The right ventricular size is normal. There is normal pulmonary artery systolic pressure.  4. The mitral valve is grossly normal. Trivial mitral valve regurgitation. No evidence of mitral stenosis. Mild MAC.  5. The inferior vena cava is normal in  size with greater than 50% respiratory variability, suggesting right atrial pressure of 3 mmHg. Comparison(s): A prior study was performed on 07/09/2019. Similar aortic valve gradients with diminished LV stroke volume and index from prior. Cannot exclude a component of paradoxial low flow low gradient aortic stenosis. FINDINGS  Left Ventricle: Left ventricular ejection fraction, by estimation, is 60 to 65%. The left ventricle has normal function. The left ventricle has no regional wall motion abnormalities. The left ventricular internal cavity size was normal in size. There is  no left ventricular hypertrophy. Left ventricular diastolic parameters are  indeterminate. Right Ventricle: The right ventricular size is normal. No increase in right ventricular wall thickness. Right ventricular systolic function is normal. There is normal pulmonary artery systolic pressure. The tricuspid regurgitant velocity is 2.24 m/s, and  with an assumed right atrial pressure of 3 mmHg, the estimated right ventricular systolic pressure is 53.9 mmHg. Left Atrium: Left atrial size was normal in size. Right Atrium: Right atrial size was normal in size. Pericardium: There is no evidence of pericardial effusion. Mitral Valve: The mitral valve is grossly normal. There is mild calcification of the mitral valve leaflet(s). Mild mitral annular calcification. Trivial mitral valve regurgitation. No evidence of mitral valve stenosis. Tricuspid Valve: The tricuspid valve is normal in structure. Tricuspid valve regurgitation is mild . No evidence of tricuspid stenosis. Aortic Valve: DVI 0.29, LV Stroke Volume Index diminished, paradoxical low flow low gradient AS in the the DDX. The aortic valve is calcified. Aortic valve regurgitation is trivial. Aortic valve mean gradient measures 10.5 mmHg. Aortic valve peak gradient measures 19.0 mmHg. Aortic valve area, by VTI measures 0.83 cm. Pulmonic Valve: The pulmonic valve was grossly normal. Pulmonic valve regurgitation is not visualized. No evidence of pulmonic stenosis. Aorta: The aortic root and ascending aorta are structurally normal, with no evidence of dilitation. Venous: The inferior vena cava is normal in size with greater than 50% respiratory variability, suggesting right atrial pressure of 3 mmHg. IAS/Shunts: The atrial septum is grossly normal.  LEFT VENTRICLE PLAX 2D LVIDd:         4.40 cm LVIDs:         2.80 cm LV PW:         1.00 cm LV IVS:        1.00 cm LVOT diam:     1.90 cm LV SV:         33 LV SV Index:   19 LVOT Area:     2.84 cm  RIGHT VENTRICLE RV Basal diam:  3.30 cm RV S prime:     12.23 cm/s TAPSE (M-mode): 2.4 cm LEFT ATRIUM              Index       RIGHT ATRIUM           Index LA diam:        4.00 cm 2.38 cm/m  RA Area:     12.10 cm LA Vol (A2C):   59.9 ml 35.61 ml/m RA Volume:   27.80 ml  16.53 ml/m LA Vol (A4C):   37.7 ml 22.41 ml/m LA Biplane Vol: 51.2 ml 30.44 ml/m  AORTIC VALVE AV Area (Vmax):    0.79 cm AV Area (Vmean):   0.78 cm AV Area (VTI):     0.83 cm AV Vmax:           217.75 cm/s AV Vmean:          151.000 cm/s AV VTI:  0.394 m AV Peak Grad:      19.0 mmHg AV Mean Grad:      10.5 mmHg LVOT Vmax:         60.45 cm/s LVOT Vmean:        41.300 cm/s LVOT VTI:          0.116 m LVOT/AV VTI ratio: 0.29  AORTA Ao Root diam: 2.80 cm Ao Asc diam:  3.40 cm MV E velocity: 124.33 cm/s  TRICUSPID VALVE                             TR Peak grad:   20.1 mmHg                             TR Vmax:        224.00 cm/s                              SHUNTS                             Systemic VTI:  0.12 m                             Systemic Diam: 1.90 cm Rudean Haskell MD Electronically signed by Rudean Haskell MD Signature Date/Time: 03/26/2021/2:38:41 PM    Final    IR Radiologist Eval & Mgmt  Result Date: 03/27/2021 Please refer to notes tab for details about interventional procedure. (Op Note)   Labs:  CBC: Recent Labs    07/18/20 0930  WBC 4.8  HGB 12.4*  HCT 35.9*  PLT 185    COAGS: No results for input(s): INR, APTT in the last 8760 hours.  BMP: Recent Labs    07/18/20 0930  NA 133*  K 4.2  CL 95*  CO2 25  GLUCOSE 96  BUN 13  CALCIUM 9.0  CREATININE 0.95  GFRNONAA 79  GFRAA 91    LIVER FUNCTION TESTS: Recent Labs    07/18/20 0930  BILITOT 0.5  AST 43*  ALT 28  ALKPHOS 72  PROT 7.1  ALBUMIN 4.3     Assessment and Plan:  I met with Mr. Motta and reviewed all of his pre- and post- EVAR CTA studies with him. There is a persistent endoleak present without aneurysm sac enlargement. Current sac size of 3.2 cm is equal to the original size of the saccular aneurysm.  Given location of the endoleak and the original aneurysm configuration, the most likely source of perfusion is from retrograde flow in the IMA, which is supported by the CTA studies. A pathway can be connected from a middle colic branch of the SMA eventually to the IMA trunk via collateral supply on the most recent CTA. This does not appear to be a type I endoleak, and type II from lumbar supply is not likely, given the original aneurysm morphology. One or more lumbar arteries could be acting as outflow for the endoleak, however.  I discussed treatment options with Mr. Hackman including continued observation with surveillance CTA and arteriography with the intent to find a type II endoleak that may be approachable by catheterization to allow embolization/occlusion. Given that the sac size has not definitely enlarged in the post-op period after EVAR and he  has no symptoms, I recommended that continued surveillance would likely be the most prudent course. Should the sac size increase over time due to persistent endoleak, arteriography would be indicated with possible transcatheter embolization at that time. The location of this particular endoleak anteriorly is not a good candidate for a direct puncture approach in the future.  I recommended a follow up CTA in late May of next year, one year after his last CTA. Mr. Carmickle is agreeable to this plan and I will plan to meet with him to review findings at that time.  Thank you for this interesting consult.  I greatly enjoyed meeting Roemello Speyer and look forward to participating in their care.  A copy of this report was sent to the requesting provider on this date.  Electronically Signed: Azzie Roup 03/31/2021, 8:03 PM    I spent a total of 40 Minutes  in face to face in clinical consultation, greater than 50% of which was counseling/coordinating care for aortic endoleak.

## 2021-04-04 DIAGNOSIS — B070 Plantar wart: Secondary | ICD-10-CM | POA: Diagnosis not present

## 2021-04-04 DIAGNOSIS — Z6824 Body mass index (BMI) 24.0-24.9, adult: Secondary | ICD-10-CM | POA: Diagnosis not present

## 2021-04-12 DIAGNOSIS — M79645 Pain in left finger(s): Secondary | ICD-10-CM | POA: Diagnosis not present

## 2021-04-13 DIAGNOSIS — K219 Gastro-esophageal reflux disease without esophagitis: Secondary | ICD-10-CM | POA: Diagnosis not present

## 2021-04-13 DIAGNOSIS — N400 Enlarged prostate without lower urinary tract symptoms: Secondary | ICD-10-CM | POA: Diagnosis not present

## 2021-04-13 DIAGNOSIS — I10 Essential (primary) hypertension: Secondary | ICD-10-CM | POA: Diagnosis not present

## 2021-04-13 DIAGNOSIS — D649 Anemia, unspecified: Secondary | ICD-10-CM | POA: Diagnosis not present

## 2021-04-13 DIAGNOSIS — M1712 Unilateral primary osteoarthritis, left knee: Secondary | ICD-10-CM | POA: Diagnosis not present

## 2021-04-13 DIAGNOSIS — E7800 Pure hypercholesterolemia, unspecified: Secondary | ICD-10-CM | POA: Diagnosis not present

## 2021-04-13 DIAGNOSIS — I4891 Unspecified atrial fibrillation: Secondary | ICD-10-CM | POA: Diagnosis not present

## 2021-04-13 DIAGNOSIS — I2510 Atherosclerotic heart disease of native coronary artery without angina pectoris: Secondary | ICD-10-CM | POA: Diagnosis not present

## 2021-04-29 ENCOUNTER — Ambulatory Visit: Payer: PPO | Admitting: Physician Assistant

## 2021-04-29 ENCOUNTER — Other Ambulatory Visit: Payer: Self-pay

## 2021-04-29 ENCOUNTER — Encounter: Payer: Self-pay | Admitting: Physician Assistant

## 2021-04-29 DIAGNOSIS — L570 Actinic keratosis: Secondary | ICD-10-CM

## 2021-04-29 DIAGNOSIS — Z85828 Personal history of other malignant neoplasm of skin: Secondary | ICD-10-CM

## 2021-04-29 DIAGNOSIS — L309 Dermatitis, unspecified: Secondary | ICD-10-CM

## 2021-04-29 NOTE — Progress Notes (Signed)
   Follow-Up Visit   Subjective  Corey Garcia is a 75 y.o. male who presents for the following: Follow-up (LN2 last office visit- scalp- no issues since. Personal history of non mole skin cancer. ).   The following portions of the chart were reviewed this encounter and updated as appropriate:  Tobacco  Allergies  Meds  Problems  Med Hx  Surg Hx  Fam Hx      Objective  Well appearing patient in no apparent distress; mood and affect are within normal limits.  All skin waist up examined.  Left Buccal Cheek, Mid Forehead, Mid Frontal Scalp, Right Buccal Cheek Erythematous patches with gritty scale.  Mid Parietal Scalp Scalp was clear with the exception of a pink scale.   Assessment & Plan  AK (actinic keratosis) (4) Mid Frontal Scalp; Mid Forehead; Left Buccal Cheek; Right Buccal Cheek  Destruction of lesion - Left Buccal Cheek, Mid Forehead, Mid Frontal Scalp, Right Buccal Cheek Complexity: simple   Destruction method: cryotherapy   Informed consent: discussed and consent obtained   Timeout:  patient name, date of birth, surgical site, and procedure verified Lesion destroyed using liquid nitrogen: Yes   Cryotherapy cycles:  3 Outcome: patient tolerated procedure well with no complications    Dermatitis Mid Parietal Scalp  He was prescribed cephalexin and it completely cleared. Observe for recurrence.   No atypical nevi noted at the time of the visit.   I, Kaileigh Viswanathan, PA-C, have reviewed all documentation's for this visit.  The documentation on 04/29/21 for the exam, diagnosis, procedures and orders are all accurate and complete.

## 2021-05-01 ENCOUNTER — Ambulatory Visit: Payer: Self-pay | Admitting: *Deleted

## 2021-05-01 NOTE — Telephone Encounter (Signed)
Summary: Call back request (Covid Vaccine questions)   Pt has questions regarding his covid vaccines [phone], requesting to speak to a nurse     Attempted to call patient- call picked up and disconnected. Attempted to call back- busy signal.

## 2021-05-01 NOTE — Telephone Encounter (Signed)
Call to patient: Advised per [facility] website/CDC recommendation: As of 04/30/21, we have stopped  offering the current  [place] boosters.  FDA has authorized an updated COVID 19 booster and the system is in transition to that. As soon as guidelines and inventory are received- [facility] will begin administering the updated boosters.  Timeline: Unsure at this time- please be vigilant with MyChart-, news outlets, pharmacies and [facility] website for details     Reason for Disposition  COVID-19 vaccine, Frequently Asked Questions (FAQs)  Answer Assessment - Initial Assessment Questions 1. MAIN CONCERN OR SYMPTOM:  "What is your main concern right now?" "What question do you have?" "What's the main symptom you're worried about?" (e.g., fever, pain, redness, swelling)     When is new booster vaccine - bivalent going to be available? 2. VACCINE: "What vaccination did you receive?" (e.g., none; AstraZeneca, J&J, [place], other) "Is this your first, second shot, or booster?" (e.g., first, second, booster)     Booster  Protocols used: Coronavirus (COVID-19) Vaccine Questions and Reactions-A-AH

## 2021-05-13 ENCOUNTER — Ambulatory Visit: Payer: PPO

## 2021-05-14 DIAGNOSIS — I10 Essential (primary) hypertension: Secondary | ICD-10-CM | POA: Diagnosis not present

## 2021-05-14 DIAGNOSIS — I4891 Unspecified atrial fibrillation: Secondary | ICD-10-CM | POA: Diagnosis not present

## 2021-05-14 DIAGNOSIS — E7800 Pure hypercholesterolemia, unspecified: Secondary | ICD-10-CM | POA: Diagnosis not present

## 2021-05-14 DIAGNOSIS — K219 Gastro-esophageal reflux disease without esophagitis: Secondary | ICD-10-CM | POA: Diagnosis not present

## 2021-05-14 DIAGNOSIS — D649 Anemia, unspecified: Secondary | ICD-10-CM | POA: Diagnosis not present

## 2021-05-14 DIAGNOSIS — N400 Enlarged prostate without lower urinary tract symptoms: Secondary | ICD-10-CM | POA: Diagnosis not present

## 2021-05-14 DIAGNOSIS — I2510 Atherosclerotic heart disease of native coronary artery without angina pectoris: Secondary | ICD-10-CM | POA: Diagnosis not present

## 2021-05-14 DIAGNOSIS — M1712 Unilateral primary osteoarthritis, left knee: Secondary | ICD-10-CM | POA: Diagnosis not present

## 2021-05-20 ENCOUNTER — Ambulatory Visit: Payer: PPO | Attending: Internal Medicine

## 2021-05-20 ENCOUNTER — Other Ambulatory Visit (HOSPITAL_BASED_OUTPATIENT_CLINIC_OR_DEPARTMENT_OTHER): Payer: Self-pay

## 2021-05-20 DIAGNOSIS — Z23 Encounter for immunization: Secondary | ICD-10-CM

## 2021-05-20 MED ORDER — PFIZER COVID-19 VAC BIVALENT 30 MCG/0.3ML IM SUSP
INTRAMUSCULAR | 0 refills | Status: DC
  Filled 2021-05-20: qty 0.3, 1d supply, fill #0

## 2021-05-20 NOTE — Progress Notes (Signed)
   Covid-19 Vaccination Clinic  Name:  Patrich Heinze    MRN: [id number] DOB: Aug 19, 1946  05/20/2021  Mr. Laski was observed post Covid-19 immunization for 15 minutes without incident. He was provided with Vaccine Information Sheet and instruction to access the V-Safe system.   Mr. Luger was instructed to call 911 with any severe reactions post vaccine: Difficulty breathing  Swelling of face and throat  A fast heartbeat  A bad rash all over body  Dizziness and weakness

## 2021-06-17 DIAGNOSIS — L989 Disorder of the skin and subcutaneous tissue, unspecified: Secondary | ICD-10-CM | POA: Diagnosis not present

## 2021-06-18 ENCOUNTER — Telehealth: Payer: Self-pay | Admitting: Dermatology

## 2021-06-18 NOTE — Telephone Encounter (Signed)
Est.; I scheduled him for 11/28 w/ST. Said Dr. Jacelyn Grip was supposed to send over referral for possible cancer on back of hand. If we got it, please tack it on to the appointment. Thanks

## 2021-07-08 ENCOUNTER — Other Ambulatory Visit: Payer: Self-pay | Admitting: Cardiovascular Disease

## 2021-07-08 NOTE — Telephone Encounter (Signed)
Prescription refill request for Eliquis received. Indication:  Last office visit:03/06/21 (Croitoru)  Scr:0.83 (02/07/21)  Age: 75 Weight: 63.6kg  Appropriate dose and refill sent to requested pharmacy.

## 2021-07-10 DIAGNOSIS — E7800 Pure hypercholesterolemia, unspecified: Secondary | ICD-10-CM | POA: Diagnosis not present

## 2021-07-10 DIAGNOSIS — I4891 Unspecified atrial fibrillation: Secondary | ICD-10-CM | POA: Diagnosis not present

## 2021-07-10 DIAGNOSIS — I10 Essential (primary) hypertension: Secondary | ICD-10-CM | POA: Diagnosis not present

## 2021-07-10 DIAGNOSIS — N400 Enlarged prostate without lower urinary tract symptoms: Secondary | ICD-10-CM | POA: Diagnosis not present

## 2021-07-10 DIAGNOSIS — M1712 Unilateral primary osteoarthritis, left knee: Secondary | ICD-10-CM | POA: Diagnosis not present

## 2021-07-10 DIAGNOSIS — I2510 Atherosclerotic heart disease of native coronary artery without angina pectoris: Secondary | ICD-10-CM | POA: Diagnosis not present

## 2021-07-10 DIAGNOSIS — D649 Anemia, unspecified: Secondary | ICD-10-CM | POA: Diagnosis not present

## 2021-07-10 DIAGNOSIS — K219 Gastro-esophageal reflux disease without esophagitis: Secondary | ICD-10-CM | POA: Diagnosis not present

## 2021-07-11 DIAGNOSIS — Z0000 Encounter for general adult medical examination without abnormal findings: Secondary | ICD-10-CM | POA: Diagnosis not present

## 2021-07-11 DIAGNOSIS — Z1389 Encounter for screening for other disorder: Secondary | ICD-10-CM | POA: Diagnosis not present

## 2021-07-15 ENCOUNTER — Other Ambulatory Visit: Payer: Self-pay

## 2021-07-15 DIAGNOSIS — I714 Abdominal aortic aneurysm, without rupture, unspecified: Secondary | ICD-10-CM

## 2021-07-28 ENCOUNTER — Encounter: Payer: Self-pay | Admitting: Dermatology

## 2021-07-28 ENCOUNTER — Ambulatory Visit: Payer: PPO | Admitting: Dermatology

## 2021-07-28 ENCOUNTER — Other Ambulatory Visit: Payer: Self-pay | Admitting: Dermatology

## 2021-07-28 ENCOUNTER — Other Ambulatory Visit: Payer: Self-pay

## 2021-07-28 DIAGNOSIS — L570 Actinic keratosis: Secondary | ICD-10-CM | POA: Diagnosis not present

## 2021-07-28 DIAGNOSIS — C44629 Squamous cell carcinoma of skin of left upper limb, including shoulder: Secondary | ICD-10-CM | POA: Diagnosis not present

## 2021-07-28 NOTE — Patient Instructions (Signed)

## 2021-07-30 ENCOUNTER — Telehealth: Payer: Self-pay | Admitting: *Deleted

## 2021-07-30 NOTE — Telephone Encounter (Signed)
Path to patient. Patient will call if lesion recurs.

## 2021-07-30 NOTE — Telephone Encounter (Signed)
-----   Message from Lavonna Monarch, MD sent at 07/30/2021  4:15 AM EST ----- Pathology report indicates this could be either a thick precancer or a low risk superficial cancer.  Base was treated at the time of biopsy so if it heals smooth no further treatment should be required.

## 2021-08-03 DIAGNOSIS — Z1211 Encounter for screening for malignant neoplasm of colon: Secondary | ICD-10-CM | POA: Diagnosis not present

## 2021-08-06 ENCOUNTER — Other Ambulatory Visit: Payer: Self-pay | Admitting: Vascular Surgery

## 2021-08-06 DIAGNOSIS — Z9889 Other specified postprocedural states: Secondary | ICD-10-CM

## 2021-08-06 DIAGNOSIS — Z8679 Personal history of other diseases of the circulatory system: Secondary | ICD-10-CM

## 2021-08-07 ENCOUNTER — Ambulatory Visit: Payer: PPO | Admitting: Vascular Surgery

## 2021-08-07 ENCOUNTER — Ambulatory Visit (HOSPITAL_COMMUNITY)
Admission: RE | Admit: 2021-08-07 | Discharge: 2021-08-07 | Disposition: A | Discharge status: Home / Self Care | Payer: PPO | Source: Ambulatory Visit | Attending: Vascular Surgery | Admitting: Vascular Surgery

## 2021-08-07 ENCOUNTER — Encounter: Payer: Self-pay | Admitting: Vascular Surgery

## 2021-08-07 ENCOUNTER — Other Ambulatory Visit: Payer: Self-pay

## 2021-08-07 VITALS — BP 130/79 | HR 79 | Temp 98.1°F | Resp 20 | Ht 64.0 in | Wt 137.0 lb

## 2021-08-07 DIAGNOSIS — Z8679 Personal history of other diseases of the circulatory system: Secondary | ICD-10-CM | POA: Insufficient documentation

## 2021-08-07 DIAGNOSIS — Z9889 Other specified postprocedural states: Secondary | ICD-10-CM | POA: Diagnosis not present

## 2021-08-07 DIAGNOSIS — I7143 Infrarenal abdominal aortic aneurysm, without rupture: Secondary | ICD-10-CM

## 2021-08-07 NOTE — Progress Notes (Signed)
REASON FOR VISIT:   Follow-up after endovascular aneurysm repair.  MEDICAL ISSUES:   S/P ENDOVASCULAR ANEURYSM REPAIR: Patient is doing well status post repair of a 3.2 cm saccular infrarenal abdominal aortic aneurysm in 2019.  On today's duplex the maximum diameter of the aneurysm is 2.7 cm.  There appears to be a type II endoleak.  I reviewed the previous CT scan from 01/24/2021 which shows a good seal length proximally and distally so again I think this is a type II endoleak.  I recommended a follow-up ultrasound in 9 months and I will see him back at that time.  He is not a smoker.  His blood pressure is under good control.  He is on Eliquis so was not on aspirin.  He is on a statin.  He knows to call sooner if he has problems.  HPI:   Corey Garcia is a pleasant 75 y.o. male who underwent repair of a 3.2 cm saccular aneurysm on 01/13/2018.  When I saw him last the maximum diameter of the aneurysm was 2.8 cm.  CT scan on 01/24/2021 showed that the aneurysm was stable in size at 3.2 cm with some contrast enhancement along the anterior inferior aspect of the aneurysm compatible most likely with a type II endoleak.  Since I saw him last, he denies any abdominal pain or back pain.  There have been no significant changes to his medical history.  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 ([place]) 03/15/2014   pt .denies at preop   Murmur, cardiac    faint early systolic grade 1/6 aortic ejection murmur   Myocardial infarction [facility])    Peripheral vascular disease (HCC)    Rib  pain 04/16/2014   SCCA (squamous cell carcinoma) of skin 12/19/2020   Left Malar Cheek Lateral (well diff) (tx p bx)   SCCA (squamous cell carcinoma) of skin 12/19/2020   Right Scalp Posterior (in situ) (tx p bx)   SCCA (squamous cell carcinoma) of skin 12/19/2020   Right Scalp Anterior (in situ) (tx p bx)   Skin cancer    basal cell ca X 4    Family History  Problem Relation Age of Onset   Heart failure Mother        heart disease   Heart disease Mother    Heart failure Father        heart disease   Heart disease Father    Heart disease Brother    Heart failure Brother        "bad heart valve"    SOCIAL HISTORY: Social History   Tobacco Use   Smoking status: Former    Types: Cigarettes    Quit date: 10/31/1982    Years since quitting: 38.7   Smokeless tobacco: Never  Substance Use Topics   Alcohol use: Yes    Alcohol/week: 28.0 standard drinks    Types: 28 Cans of beer per week    Comment: "3-4 beers or hard apple  ciders/ day" 01/10/2018    Allergies  Allergen Reactions   Lisinopril Other (See Comments)    PHOTOSENSITIVITY ON ARMS   Minocin [Minocycline Hcl]     Flank pain   Sulfa Antibiotics     Drop in BP   Thiazide-Type Diuretics Other (See Comments)    "Lowered my blood pressure"   Codeine Nausea Only    Current Outpatient Medications  Medication Sig Dispense Refill   AMOXICILLIN PO Take by mouth. Take prior to dental work.     atorvastatin (LIPITOR) 20 MG tablet Take 20 mg by mouth daily.     bisoprolol (ZEBETA) 10 MG tablet Take 15 mg by mouth daily.      cholecalciferol (VITAMIN D) 1000 units tablet Take 1,000 Units by mouth daily.     Docusate Sodium 100 MG capsule      ELIQUIS 5 MG TABS tablet TAKE ONE TABLET BY MOUTH TWICE DAILY 180 tablet 1   hydrocortisone (ANUSOL-HC) 2.5 % rectal cream Place 1 application rectally as needed for hemorrhoids or itching.     loratadine (CLARITIN) 10 MG tablet Take 10 mg by mouth daily.      pantoprazole (PROTONIX)  40 MG tablet Take 40 mg by mouth daily.      Probiotic Product (PROBIOTIC PO) Take 1 tablet by mouth daily.     sildenafil (VIAGRA) 100 MG tablet      tamsulosin (FLOMAX) 0.4 MG CAPS capsule Take 0.4 mg by mouth daily.     No current facility-administered medications for this visit.    REVIEW OF SYSTEMS:  [X]  denotes positive finding, [ ]  denotes negative finding Cardiac  Comments:  Chest pain or chest pressure:    Shortness of breath upon exertion:    Short of breath when lying flat:    Irregular heart rhythm: x       Vascular    Pain in calf, thigh, or hip brought on by ambulation:    Pain in feet at night that wakes you up from your sleep:     Blood clot in your veins:    Leg swelling:         Pulmonary    Oxygen at home:    Productive cough:     Wheezing:         Neurologic    Sudden weakness in arms or legs:     Sudden numbness in arms or legs:     Sudden onset of difficulty speaking or slurred speech:    Temporary loss of vision in one eye:     Problems with dizziness:         Gastrointestinal    Blood in stool:     Vomited blood:         Genitourinary    Burning when urinating:     Blood in urine:        Psychiatric    Major depression:         Hematologic    Bleeding problems:    Problems with blood clotting too easily:        Skin    Rashes or ulcers:        Constitutional    Fever or chills:     PHYSICAL EXAM:   Vitals:   08/07/21 0838  BP: 130/79  Pulse: 79  Resp: 20  Temp: 98.1 F (36.7 C)  SpO2: 98%  Weight: 137 lb (62.1 kg)  Height: 5\' 4"  (1.626 m)    GENERAL: The patient  is a well-nourished male, in no acute distress. The vital signs are documented above. CARDIAC: There is a regular rate and rhythm.  VASCULAR: I do not detect carotid bruits. He has palpable femoral pulses. Both feet are warm and well-perfused. There is no significant lower extremity swelling. PULMONARY: There is good air exchange bilaterally without wheezing or  rales. ABDOMEN: Soft and non-tender with normal pitched bowel sounds.  MUSCULOSKELETAL: There are no major deformities or cyanosis. NEUROLOGIC: No focal weakness or paresthesias are detected. SKIN: There are no ulcers or rashes noted. PSYCHIATRIC: The patient has a normal affect.  DATA:    DUPLEX ABDOMINAL AORTA: I have independently interpreted his duplex of the abdominal aorta.  The maximum diameter of the aneurysm is 2.7 cm which is not changed compared to his previous duplex on 01/17/2020.  There appears to be a type II endoleak.  This is not new based on his previous CT scan.  Deitra Mayo [facility] [phone]

## 2021-08-08 ENCOUNTER — Other Ambulatory Visit: Payer: Self-pay

## 2021-08-08 DIAGNOSIS — K219 Gastro-esophageal reflux disease without esophagitis: Secondary | ICD-10-CM | POA: Diagnosis not present

## 2021-08-08 DIAGNOSIS — E7800 Pure hypercholesterolemia, unspecified: Secondary | ICD-10-CM | POA: Diagnosis not present

## 2021-08-08 DIAGNOSIS — I4891 Unspecified atrial fibrillation: Secondary | ICD-10-CM | POA: Diagnosis not present

## 2021-08-08 DIAGNOSIS — D649 Anemia, unspecified: Secondary | ICD-10-CM | POA: Diagnosis not present

## 2021-08-08 DIAGNOSIS — I10 Essential (primary) hypertension: Secondary | ICD-10-CM | POA: Diagnosis not present

## 2021-08-08 DIAGNOSIS — I2510 Atherosclerotic heart disease of native coronary artery without angina pectoris: Secondary | ICD-10-CM | POA: Diagnosis not present

## 2021-08-08 DIAGNOSIS — N400 Enlarged prostate without lower urinary tract symptoms: Secondary | ICD-10-CM | POA: Diagnosis not present

## 2021-08-08 DIAGNOSIS — M1712 Unilateral primary osteoarthritis, left knee: Secondary | ICD-10-CM | POA: Diagnosis not present

## 2021-08-08 DIAGNOSIS — I7143 Infrarenal abdominal aortic aneurysm, without rupture: Secondary | ICD-10-CM

## 2021-08-12 DIAGNOSIS — M1712 Unilateral primary osteoarthritis, left knee: Secondary | ICD-10-CM | POA: Diagnosis not present

## 2021-08-12 DIAGNOSIS — D649 Anemia, unspecified: Secondary | ICD-10-CM | POA: Diagnosis not present

## 2021-08-12 DIAGNOSIS — K219 Gastro-esophageal reflux disease without esophagitis: Secondary | ICD-10-CM | POA: Diagnosis not present

## 2021-08-12 DIAGNOSIS — I4891 Unspecified atrial fibrillation: Secondary | ICD-10-CM | POA: Diagnosis not present

## 2021-08-12 DIAGNOSIS — N400 Enlarged prostate without lower urinary tract symptoms: Secondary | ICD-10-CM | POA: Diagnosis not present

## 2021-08-12 DIAGNOSIS — R809 Proteinuria, unspecified: Secondary | ICD-10-CM | POA: Diagnosis not present

## 2021-08-12 DIAGNOSIS — E7800 Pure hypercholesterolemia, unspecified: Secondary | ICD-10-CM | POA: Diagnosis not present

## 2021-08-12 DIAGNOSIS — I10 Essential (primary) hypertension: Secondary | ICD-10-CM | POA: Diagnosis not present

## 2021-08-12 DIAGNOSIS — Z125 Encounter for screening for malignant neoplasm of prostate: Secondary | ICD-10-CM | POA: Diagnosis not present

## 2021-08-12 DIAGNOSIS — I714 Abdominal aortic aneurysm, without rupture, unspecified: Secondary | ICD-10-CM | POA: Diagnosis not present

## 2021-08-12 DIAGNOSIS — I2510 Atherosclerotic heart disease of native coronary artery without angina pectoris: Secondary | ICD-10-CM | POA: Diagnosis not present

## 2021-08-16 ENCOUNTER — Encounter: Payer: Self-pay | Admitting: Dermatology

## 2021-08-16 NOTE — Progress Notes (Signed)
° °  Follow-Up Visit   Subjective  Corey Garcia is a 75 y.o. male who presents for the following: Skin Problem (New spot on left hand- put compound w on it).  New growth dorsal left hand Location:  Duration:  Quality:  Associated Signs/Symptoms: Modifying Factors:  Severity:  Timing: Context:   Objective  Well appearing patient in no apparent distress; mood and affect are within normal limits. Left Dorsal Hand Hypertrophic 9 mm crust suggestive of SCCA         A focused examination was performed including head, neck, arms, hands. Relevant physical exam findings are noted in the Assessment and Plan.   Assessment & Plan    Squamous cell carcinoma of skin of dorsum of left hand Left Dorsal Hand  Skin / nail biopsy Type of biopsy: tangential   Informed consent: discussed and consent obtained   Timeout: patient name, date of birth, surgical site, and procedure verified   Procedure prep:  Patient was prepped and draped in usual sterile fashion (Non sterile) Prep type:  Chlorhexidine Anesthesia: the lesion was anesthetized in a standard fashion   Anesthetic:  1% lidocaine w/ epinephrine 1-100,000 local infiltration Instrument used: flexible razor blade   Outcome: patient tolerated procedure well   Post-procedure details: wound care instructions given    Destruction of lesion Complexity: simple   Destruction method: electrodesiccation and curettage   Informed consent: discussed and consent obtained   Timeout:  patient name, date of birth, surgical site, and procedure verified Anesthesia: the lesion was anesthetized in a standard fashion   Anesthetic:  1% lidocaine w/ epinephrine 1-100,000 local infiltration Curettage performed in three different directions: Yes   Curettage cycles:  2 Lesion length (cm):  1 Lesion width (cm):  1 Margin per side (cm):  0 Final wound size (cm):  1 Hemostasis achieved with:  aluminum chloride and ferric subsulfate Outcome:  patient tolerated procedure well with no complications   Post-procedure details: sterile dressing applied and wound care instructions given   Dressing type: bandage and petrolatum    Destruction of lesion  Specimen 1 - Surgical pathology Differential Diagnosis: bcc vs scc- txpbx (2 specimens)  Check Margins: No  Shave biopsy showed deeper keratinous extension, this was treated with curettage plus cautery of the base and margins.      I, Lavonna Monarch, MD, have reviewed all documentation for this visit.  The documentation on 08/16/21 for the exam, diagnosis, procedures, and orders are all accurate and complete.

## 2021-08-18 DIAGNOSIS — I10 Essential (primary) hypertension: Secondary | ICD-10-CM | POA: Diagnosis not present

## 2021-08-18 DIAGNOSIS — I4891 Unspecified atrial fibrillation: Secondary | ICD-10-CM | POA: Diagnosis not present

## 2021-08-18 DIAGNOSIS — Z6824 Body mass index (BMI) 24.0-24.9, adult: Secondary | ICD-10-CM | POA: Diagnosis not present

## 2021-08-19 ENCOUNTER — Other Ambulatory Visit: Payer: Self-pay

## 2021-08-19 ENCOUNTER — Ambulatory Visit: Payer: PPO | Admitting: Physician Assistant

## 2021-08-19 ENCOUNTER — Encounter: Payer: Self-pay | Admitting: Physician Assistant

## 2021-08-19 DIAGNOSIS — Z808 Family history of malignant neoplasm of other organs or systems: Secondary | ICD-10-CM | POA: Diagnosis not present

## 2021-08-19 DIAGNOSIS — L080 Pyoderma: Secondary | ICD-10-CM | POA: Diagnosis not present

## 2021-08-19 DIAGNOSIS — Z85828 Personal history of other malignant neoplasm of skin: Secondary | ICD-10-CM

## 2021-08-19 DIAGNOSIS — C4442 Squamous cell carcinoma of skin of scalp and neck: Secondary | ICD-10-CM

## 2021-08-19 DIAGNOSIS — D044 Carcinoma in situ of skin of scalp and neck: Secondary | ICD-10-CM | POA: Diagnosis not present

## 2021-08-19 DIAGNOSIS — D485 Neoplasm of uncertain behavior of skin: Secondary | ICD-10-CM

## 2021-09-02 ENCOUNTER — Telehealth: Payer: Self-pay

## 2021-09-02 NOTE — Telephone Encounter (Signed)
Phone call from patient wanting his pathology results. Per Robyne Askew, PA okay to give patient his results. Patient aware of results.

## 2021-09-03 ENCOUNTER — Telehealth: Payer: Self-pay

## 2021-09-03 NOTE — Telephone Encounter (Signed)
-----   Message from Warren Danes, [place] sent at 09/03/2021  8:11 AM EST ----- 30

## 2021-09-03 NOTE — Telephone Encounter (Signed)
Left message to call office needs 30 with [facility]

## 2021-09-04 ENCOUNTER — Encounter: Payer: Self-pay | Admitting: Physician Assistant

## 2021-09-04 NOTE — Progress Notes (Addendum)
Follow-Up Visit   Subjective  Corey Garcia is a 76 y.o. male who presents for the following: Hair/Scalp Problem (Patient states he has eczema on scalp x years, some itching and bleeding. Lesion on right side of nose no itching, but bleeding. Personal history of bcc. Family history of melanoma. ).   The following portions of the chart were reviewed this encounter and updated as appropriate:  Tobacco   Allergies   Meds   Problems   Med Hx   Surg Hx   Fam Hx       Objective  Well appearing patient in no apparent distress; mood and affect are within normal limits.  A focused examination was performed including face and scalp. Relevant physical exam findings are noted in the Assessment and Plan.  Right Tip of Nose Hyperkeratotic scale with pink base        Right Mid Parietal Scalp Hyperkeratotic scale with pink base        Mid Parietal Scalp Hyperkeratotic scale with pink base        Left Mid Parietal Scalp Hyperkeratotic scale with pink base         Assessment & Plan  Neoplasm of uncertain behavior of skin Right Tip of Nose  Skin / nail biopsy Type of biopsy: tangential   Informed consent: discussed and consent obtained   Timeout: patient name, date of birth, surgical site, and procedure verified   Procedure prep:  Patient was prepped and draped in usual sterile fashion (Non sterile) Prep type:  Chlorhexidine Anesthesia: the lesion was anesthetized in a standard fashion   Anesthetic:  1% lidocaine w/ epinephrine 1-100,000 local infiltration Instrument used: flexible razor blade   Outcome: patient tolerated procedure well   Post-procedure details: wound care instructions given    Specimen 4 - Surgical pathology Differential Diagnosis: R/O BCC vs SCC - MOH'S if +  Check Margins: YES  SCC (squamous cell carcinoma), scalp/neck Right Mid Parietal Scalp  Skin / nail biopsy Type of biopsy: tangential   Informed consent: discussed and consent  obtained   Timeout: patient name, date of birth, surgical site, and procedure verified   Procedure prep:  Patient was prepped and draped in usual sterile fashion (Non sterile) Prep type:  Chlorhexidine Anesthesia: the lesion was anesthetized in a standard fashion   Anesthetic:  1% lidocaine w/ epinephrine 1-100,000 local infiltration Instrument used: flexible razor blade   Outcome: patient tolerated procedure well   Post-procedure details: wound care instructions given    Destruction of lesion Complexity: simple   Destruction method: electrodesiccation and curettage   Informed consent: discussed and consent obtained   Timeout:  patient name, date of birth, surgical site, and procedure verified Anesthesia: the lesion was anesthetized in a standard fashion   Anesthetic:  1% lidocaine w/ epinephrine 1-100,000 local infiltration Curettage performed in three different directions: Yes   Curettage cycles:  1 Margin per side (cm):  0.1 Final wound size (cm):  1.4 Hemostasis achieved with:  aluminum chloride Outcome: patient tolerated procedure well with no complications   Post-procedure details: sterile dressing applied and wound care instructions given   Dressing type: petrolatum    Specimen 1 - Surgical pathology Differential Diagnosis: R/O BCC vs SCC - treated after biopsy  Check Margins: YES  Carcinoma in situ of skin of scalp (2) Mid Parietal Scalp  Skin / nail biopsy Type of biopsy: tangential   Informed consent: discussed and consent obtained   Timeout: patient name, date of birth,  surgical site, and procedure verified   Procedure prep:  Patient was prepped and draped in usual sterile fashion (Non sterile) Prep type:  Chlorhexidine Anesthesia: the lesion was anesthetized in a standard fashion   Anesthetic:  1% lidocaine w/ epinephrine 1-100,000 local infiltration Instrument used: flexible razor blade   Outcome: patient tolerated procedure well   Post-procedure details:  wound care instructions given    Destruction of lesion Complexity: simple   Destruction method: electrodesiccation and curettage   Informed consent: discussed and consent obtained   Timeout:  patient name, date of birth, surgical site, and procedure verified Anesthesia: the lesion was anesthetized in a standard fashion   Anesthetic:  1% lidocaine w/ epinephrine 1-100,000 local infiltration Curettage performed in three different directions: Yes   Curettage cycles:  1 Margin per side (cm):  0.1 Final wound size (cm):  1.3 Hemostasis achieved with:  aluminum chloride Outcome: patient tolerated procedure well with no complications   Post-procedure details: sterile dressing applied and wound care instructions given   Dressing type: petrolatum    Specimen 2 - Surgical pathology Differential Diagnosis: R/O BCC vs SCC - treated after biopsy  Check Margins: YES  Left Mid Parietal Scalp  Skin / nail biopsy Type of biopsy: tangential   Informed consent: discussed and consent obtained   Timeout: patient name, date of birth, surgical site, and procedure verified   Procedure prep:  Patient was prepped and draped in usual sterile fashion (Non sterile) Prep type:  Chlorhexidine Anesthesia: the lesion was anesthetized in a standard fashion   Anesthetic:  1% lidocaine w/ epinephrine 1-100,000 local infiltration Instrument used: flexible razor blade   Outcome: patient tolerated procedure well   Post-procedure details: wound care instructions given    Destruction of lesion Complexity: simple   Destruction method: electrodesiccation and curettage   Informed consent: discussed and consent obtained   Timeout:  patient name, date of birth, surgical site, and procedure verified Anesthesia: the lesion was anesthetized in a standard fashion   Anesthetic:  1% lidocaine w/ epinephrine 1-100,000 local infiltration Curettage performed in three different directions: Yes   Curettage cycles:  1 Margin  per side (cm):  0.1 Final wound size (cm):  1.5 Hemostasis achieved with:  aluminum chloride Outcome: patient tolerated procedure well with no complications   Post-procedure details: wound care instructions given    Specimen 3 - Surgical pathology Differential Diagnosis: R/O BCC vs SCC - treated after biopsy  Check Margins: YES    I, Haskel Dewalt, PA-C, have reviewed all documentation's for this visit.  The documentation on 09/11/21 for the exam, diagnosis, procedures and orders are all accurate and complete.

## 2021-09-10 DIAGNOSIS — Z6823 Body mass index (BMI) 23.0-23.9, adult: Secondary | ICD-10-CM | POA: Diagnosis not present

## 2021-09-10 DIAGNOSIS — J019 Acute sinusitis, unspecified: Secondary | ICD-10-CM | POA: Diagnosis not present

## 2021-09-10 DIAGNOSIS — R067 Sneezing: Secondary | ICD-10-CM | POA: Diagnosis not present

## 2021-09-12 DIAGNOSIS — I10 Essential (primary) hypertension: Secondary | ICD-10-CM | POA: Diagnosis not present

## 2021-09-12 DIAGNOSIS — N400 Enlarged prostate without lower urinary tract symptoms: Secondary | ICD-10-CM | POA: Diagnosis not present

## 2021-09-12 DIAGNOSIS — I4891 Unspecified atrial fibrillation: Secondary | ICD-10-CM | POA: Diagnosis not present

## 2021-09-12 DIAGNOSIS — I2510 Atherosclerotic heart disease of native coronary artery without angina pectoris: Secondary | ICD-10-CM | POA: Diagnosis not present

## 2021-09-12 DIAGNOSIS — K219 Gastro-esophageal reflux disease without esophagitis: Secondary | ICD-10-CM | POA: Diagnosis not present

## 2021-09-12 DIAGNOSIS — M1712 Unilateral primary osteoarthritis, left knee: Secondary | ICD-10-CM | POA: Diagnosis not present

## 2021-09-12 DIAGNOSIS — E7800 Pure hypercholesterolemia, unspecified: Secondary | ICD-10-CM | POA: Diagnosis not present

## 2021-09-12 DIAGNOSIS — D649 Anemia, unspecified: Secondary | ICD-10-CM | POA: Diagnosis not present

## 2021-10-16 DIAGNOSIS — I4891 Unspecified atrial fibrillation: Secondary | ICD-10-CM | POA: Diagnosis not present

## 2021-10-16 DIAGNOSIS — I10 Essential (primary) hypertension: Secondary | ICD-10-CM | POA: Diagnosis not present

## 2021-10-16 DIAGNOSIS — R809 Proteinuria, unspecified: Secondary | ICD-10-CM | POA: Diagnosis not present

## 2021-10-16 DIAGNOSIS — I5032 Chronic diastolic (congestive) heart failure: Secondary | ICD-10-CM | POA: Diagnosis not present

## 2021-11-05 ENCOUNTER — Ambulatory Visit (INDEPENDENT_AMBULATORY_CARE_PROVIDER_SITE_OTHER): Payer: PPO | Admitting: Physician Assistant

## 2021-11-05 ENCOUNTER — Encounter: Payer: Self-pay | Admitting: Physician Assistant

## 2021-11-05 ENCOUNTER — Other Ambulatory Visit: Payer: Self-pay

## 2021-11-05 DIAGNOSIS — D044 Carcinoma in situ of skin of scalp and neck: Secondary | ICD-10-CM

## 2021-11-05 DIAGNOSIS — L570 Actinic keratosis: Secondary | ICD-10-CM | POA: Diagnosis not present

## 2021-11-05 DIAGNOSIS — D485 Neoplasm of uncertain behavior of skin: Secondary | ICD-10-CM

## 2021-11-05 MED ORDER — TOLAK 4 % EX CREA: TOPICAL_CREAM | CUTANEOUS | 1 refills | Status: DC

## 2021-11-05 NOTE — Patient Instructions (Signed)

## 2021-11-12 ENCOUNTER — Telehealth: Payer: Self-pay

## 2021-11-12 NOTE — Telephone Encounter (Signed)
-----   Message from Warren Danes, [place] sent at 11/12/2021 12:39 PM EDT ----- ?Tx with biopsy. 3 months to recheck his face.  ?

## 2021-11-12 NOTE — Telephone Encounter (Signed)
Path to patient and he has a June follow up already  ?

## 2021-11-13 DIAGNOSIS — H11049 Peripheral pterygium, stationary, unspecified eye: Secondary | ICD-10-CM | POA: Diagnosis not present

## 2021-11-13 DIAGNOSIS — H10403 Unspecified chronic conjunctivitis, bilateral: Secondary | ICD-10-CM | POA: Diagnosis not present

## 2021-11-17 ENCOUNTER — Encounter: Payer: Self-pay | Admitting: Physician Assistant

## 2021-11-17 ENCOUNTER — Ambulatory Visit: Payer: PPO | Admitting: Dermatology

## 2021-11-17 NOTE — Progress Notes (Signed)
? ?Follow-Up Visit ?  ?Subjective  ?Corey Garcia is a 76 y.o. male who presents for the following: Follow-up (Scalp- no new concerns). ? ? ?The following portions of the chart were reviewed this encounter and updated as appropriate:  Tobacco  Allergies  Meds  Problems  Med Hx  Surg Hx  Fam Hx   ?  ? ?Objective  ?Well appearing patient in no apparent distress; mood and affect are within normal limits. ? ?A focused examination was performed including face, neck and scalp. Relevant physical exam findings are noted in the Assessment and Plan. ? ?Left Malar Cheek ?Erythematous patches with gritty scale. ? ? ? ? ? ? ?Right Parietal Scalp ?Hyperkeratotic scale with pink base  ? ? ? ? ? ? ?Left Parietal Scalp ?Hyperkeratotic scale with pink base  ? ? ? ? ? ? ? ?Assessment & Plan  ?AK (actinic keratosis) ?Left Malar Cheek ? ?Destruction of lesion - Left Malar Cheek ?Complexity: simple   ?Destruction method: cryotherapy   ?Informed consent: discussed and consent obtained   ?Timeout:  patient name, date of birth, surgical site, and procedure verified ?Lesion destroyed using liquid nitrogen: Yes   ?Outcome: patient tolerated procedure well with no complications   ? ?Fluorouracil (TOLAK) 4 % CREA - Left Malar Cheek ?Apply to face Monday-Sunday x 2 weeks ? ?Carcinoma in situ of skin of scalp (2) ?Right Parietal Scalp ? ?Skin / nail biopsy ?Type of biopsy: tangential   ?Informed consent: discussed and consent obtained   ?Timeout: patient name, date of birth, surgical site, and procedure verified   ?Procedure prep:  Patient was prepped and draped in usual sterile fashion (Non sterile) ?Prep type:  Chlorhexidine ?Anesthesia: the lesion was anesthetized in a standard fashion   ?Anesthetic:  1% lidocaine w/ epinephrine 1-100,000 local infiltration ?Instrument used: flexible razor blade   ?Outcome: patient tolerated procedure well   ?Post-procedure details: wound care instructions given   ? ?Destruction of  lesion ?Complexity: simple   ?Destruction method: electrodesiccation and curettage   ?Informed consent: discussed and consent obtained   ?Timeout:  patient name, date of birth, surgical site, and procedure verified ?Anesthesia: the lesion was anesthetized in a standard fashion   ?Anesthetic:  1% lidocaine w/ epinephrine 1-100,000 local infiltration ?Curettage performed in three different directions: Yes   ?Curettage cycles:  3 ?Margin per side (cm):  0.1 ?Final wound size (cm):  1.1 ?Hemostasis achieved with:  aluminum chloride ?Outcome: patient tolerated procedure well with no complications   ?Post-procedure details: wound care instructions given   ? ?Specimen 1 - Surgical pathology ?Differential Diagnosis: bcc vs scc-txpbx ? ?Check Margins: No ? ?Left Parietal Scalp ? ?Skin / nail biopsy ?Type of biopsy: tangential   ?Informed consent: discussed and consent obtained   ?Timeout: patient name, date of birth, surgical site, and procedure verified   ?Procedure prep:  Patient was prepped and draped in usual sterile fashion (Non sterile) ?Prep type:  Chlorhexidine ?Anesthesia: the lesion was anesthetized in a standard fashion   ?Anesthetic:  1% lidocaine w/ epinephrine 1-100,000 local infiltration ?Instrument used: flexible razor blade   ?Outcome: patient tolerated procedure well   ?Post-procedure details: wound care instructions given   ? ?Destruction of lesion ?Complexity: simple   ?Destruction method: electrodesiccation and curettage   ?Informed consent: discussed and consent obtained   ?Timeout:  patient name, date of birth, surgical site, and procedure verified ?Anesthesia: the lesion was anesthetized in a standard fashion   ?Anesthetic:  1% lidocaine w/  epinephrine 1-100,000 local infiltration ?Curettage performed in three different directions: Yes   ?Curettage cycles:  3 ?Margin per side (cm):  0.1 ?Final wound size (cm):  1.5 ?Hemostasis achieved with:  aluminum chloride ?Outcome: patient tolerated procedure  well with no complications   ?Post-procedure details: wound care instructions given   ? ?Specimen 2 - Surgical pathology ?Differential Diagnosis: bcc vs scc- txpbx ? ?Check Margins: No ? ? ? ?I, Ceciley Buist, PA-C, have reviewed all documentation's for this visit.  The documentation on 11/17/21 for the exam, diagnosis, procedures and orders are all accurate and complete. ?

## 2021-11-19 DIAGNOSIS — R42 Dizziness and giddiness: Secondary | ICD-10-CM | POA: Diagnosis not present

## 2021-12-03 DIAGNOSIS — H109 Unspecified conjunctivitis: Secondary | ICD-10-CM | POA: Diagnosis not present

## 2021-12-18 DIAGNOSIS — I25118 Atherosclerotic heart disease of native coronary artery with other forms of angina pectoris: Secondary | ICD-10-CM | POA: Diagnosis not present

## 2021-12-18 DIAGNOSIS — F1020 Alcohol dependence, uncomplicated: Secondary | ICD-10-CM | POA: Diagnosis not present

## 2021-12-18 DIAGNOSIS — I509 Heart failure, unspecified: Secondary | ICD-10-CM | POA: Diagnosis not present

## 2021-12-18 DIAGNOSIS — K709 Alcoholic liver disease, unspecified: Secondary | ICD-10-CM | POA: Diagnosis not present

## 2021-12-18 DIAGNOSIS — Z7982 Long term (current) use of aspirin: Secondary | ICD-10-CM | POA: Diagnosis not present

## 2021-12-18 DIAGNOSIS — D692 Other nonthrombocytopenic purpura: Secondary | ICD-10-CM | POA: Diagnosis not present

## 2021-12-18 DIAGNOSIS — I480 Paroxysmal atrial fibrillation: Secondary | ICD-10-CM | POA: Diagnosis not present

## 2021-12-18 DIAGNOSIS — Z6823 Body mass index (BMI) 23.0-23.9, adult: Secondary | ICD-10-CM | POA: Diagnosis not present

## 2021-12-23 ENCOUNTER — Other Ambulatory Visit: Payer: Self-pay | Admitting: Vascular Surgery

## 2021-12-23 ENCOUNTER — Other Ambulatory Visit: Payer: Self-pay | Admitting: Interventional Radiology

## 2021-12-23 DIAGNOSIS — I9789 Other postprocedural complications and disorders of the circulatory system, not elsewhere classified: Secondary | ICD-10-CM

## 2021-12-24 ENCOUNTER — Other Ambulatory Visit: Payer: Self-pay | Admitting: Vascular Surgery

## 2022-01-04 ENCOUNTER — Other Ambulatory Visit: Payer: Self-pay | Admitting: Cardiovascular Disease

## 2022-01-05 NOTE — Telephone Encounter (Signed)
Prescription refill request for Eliquis received. ?Indication:Afib ?Last office visit:7/22 ?Scr:0.8 ?Age: 76 ?Weight:62.1 kg ? ?Prescription refilled ? ?

## 2022-01-14 ENCOUNTER — Other Ambulatory Visit (HOSPITAL_COMMUNITY): Payer: Self-pay | Admitting: Interventional Radiology

## 2022-01-14 ENCOUNTER — Other Ambulatory Visit: Payer: Self-pay | Admitting: Interventional Radiology

## 2022-01-14 DIAGNOSIS — I9789 Other postprocedural complications and disorders of the circulatory system, not elsewhere classified: Secondary | ICD-10-CM

## 2022-02-03 ENCOUNTER — Ambulatory Visit: Payer: PPO | Admitting: Physician Assistant

## 2022-02-03 ENCOUNTER — Encounter: Payer: Self-pay | Admitting: Physician Assistant

## 2022-02-03 DIAGNOSIS — L570 Actinic keratosis: Secondary | ICD-10-CM

## 2022-02-03 DIAGNOSIS — D485 Neoplasm of uncertain behavior of skin: Secondary | ICD-10-CM

## 2022-02-03 DIAGNOSIS — Z1283 Encounter for screening for malignant neoplasm of skin: Secondary | ICD-10-CM

## 2022-02-03 DIAGNOSIS — C4442 Squamous cell carcinoma of skin of scalp and neck: Secondary | ICD-10-CM

## 2022-02-03 DIAGNOSIS — D044 Carcinoma in situ of skin of scalp and neck: Secondary | ICD-10-CM

## 2022-02-03 NOTE — Patient Instructions (Signed)

## 2022-02-05 ENCOUNTER — Encounter: Payer: Self-pay | Admitting: Interventional Radiology

## 2022-02-05 ENCOUNTER — Encounter: Payer: Self-pay | Admitting: *Deleted

## 2022-02-09 ENCOUNTER — Encounter: Payer: Self-pay | Admitting: Physician Assistant

## 2022-02-09 NOTE — Progress Notes (Signed)
Follow-Up Visit   Subjective  Corey Garcia is a 76 y.o. male who presents for the following: Follow-up (Patient here today for a follow up after using Tolak on his face. Per patient he had a good reaction he does have pictures. Per patient he does have a few lesions on his scalp that he would like checked, no bleeding, some sensitivity to touch. ).   The following portions of the chart were reviewed this encounter and updated as appropriate:  Tobacco  Allergies  Meds  Problems  Med Hx  Surg Hx  Fam Hx      Objective  Well appearing patient in no apparent distress; mood and affect are within normal limits.  A focused examination was performed including waist up. Relevant physical exam findings are noted in the Assessment and Plan.  No atypical nevi   Right Temple Hyperkeratotic scale with pink base        Left Zygomatic Area, Mid Forehead, Right Parietal Scalp (2) Erythematous patches with gritty scale.  Mid Parietal Scalp Hyperkeratotic scale with pink base        Right Temporal Scalp Hyperkeratotic scale with pink base         Assessment & Plan  Encounter for screening for malignant neoplasm of skin  Skin examinations biannual  Neoplasm of uncertain behavior of skin Right Temple  Skin / nail biopsy Type of biopsy: tangential   Informed consent: discussed and consent obtained   Timeout: patient name, date of birth, surgical site, and procedure verified   Procedure prep:  Patient was prepped and draped in usual sterile fashion (Non sterile) Prep type:  Chlorhexidine Anesthesia: the lesion was anesthetized in a standard fashion   Anesthetic:  1% lidocaine w/ epinephrine 1-100,000 local infiltration Instrument used: flexible razor blade   Outcome: patient tolerated procedure well   Post-procedure details: wound care instructions given    Destruction of lesion Complexity: simple   Destruction method: electrodesiccation and curettage    Informed consent: discussed and consent obtained   Timeout:  patient name, date of birth, surgical site, and procedure verified Anesthesia: the lesion was anesthetized in a standard fashion   Anesthetic:  1% lidocaine w/ epinephrine 1-100,000 local infiltration Curettage performed in three different directions: Yes   Electrodesiccation performed over the curetted area: Yes   Curettage cycles:  3 Margin per side (cm):  0.1 Final wound size (cm):  1 Hemostasis achieved with:  aluminum chloride Outcome: patient tolerated procedure well with no complications   Post-procedure details: wound care instructions given    Specimen 3 - Surgical pathology Differential Diagnosis: bcc vs scc- txpbx  Check Margins: yes  AK (actinic keratosis) (4) Right Parietal Scalp (2); Mid Forehead; Left Zygomatic Area  Destruction of lesion - Left Zygomatic Area, Mid Forehead, Right Parietal Scalp Complexity: simple   Destruction method: cryotherapy   Informed consent: discussed and consent obtained   Timeout:  patient name, date of birth, surgical site, and procedure verified Lesion destroyed using liquid nitrogen: Yes   Cryotherapy cycles:  3 Outcome: patient tolerated procedure well with no complications    Related Medications Fluorouracil (TOLAK) 4 % CREA Apply to face Monday-Sunday x 2 weeks  Carcinoma in situ of skin of scalp Mid Parietal Scalp  Skin / nail biopsy Type of biopsy: tangential   Informed consent: discussed and consent obtained   Timeout: patient name, date of birth, surgical site, and procedure verified   Procedure prep:  Patient was prepped and draped in  usual sterile fashion (Non sterile) Prep type:  Chlorhexidine Anesthesia: the lesion was anesthetized in a standard fashion   Anesthetic:  1% lidocaine w/ epinephrine 1-100,000 local infiltration Instrument used: flexible razor blade   Outcome: patient tolerated procedure well   Post-procedure details: wound care  instructions given    Destruction of lesion Complexity: simple   Destruction method: electrodesiccation and curettage   Informed consent: discussed and consent obtained   Timeout:  patient name, date of birth, surgical site, and procedure verified Anesthesia: the lesion was anesthetized in a standard fashion   Anesthetic:  1% lidocaine w/ epinephrine 1-100,000 local infiltration Curettage performed in three different directions: Yes   Curettage cycles:  3 Margin per side (cm):  0.1 Final wound size (cm):  1.3 Hemostasis achieved with:  aluminum chloride Outcome: patient tolerated procedure well with no complications   Post-procedure details: wound care instructions given    Specimen 1 - Surgical pathology Differential Diagnosis: bcc vs scc-txpbx  Check Margins: yes  SCC (squamous cell carcinoma), scalp/neck Right Temporal Scalp  Skin / nail biopsy Type of biopsy: tangential   Informed consent: discussed and consent obtained   Timeout: patient name, date of birth, surgical site, and procedure verified   Procedure prep:  Patient was prepped and draped in usual sterile fashion (Non sterile) Prep type:  Chlorhexidine Anesthesia: the lesion was anesthetized in a standard fashion   Anesthetic:  1% lidocaine w/ epinephrine 1-100,000 local infiltration Instrument used: flexible razor blade   Outcome: patient tolerated procedure well   Post-procedure details: wound care instructions given    Destruction of lesion Complexity: simple   Destruction method: electrodesiccation and curettage   Informed consent: discussed and consent obtained   Timeout:  patient name, date of birth, surgical site, and procedure verified Anesthesia: the lesion was anesthetized in a standard fashion   Anesthetic:  1% lidocaine w/ epinephrine 1-100,000 local infiltration Curettage performed in three different directions: Yes   Electrodesiccation performed over the curetted area: Yes   Curettage cycles:   3 Margin per side (cm):  0.1 Final wound size (cm):  1 Hemostasis achieved with:  aluminum chloride Outcome: patient tolerated procedure well with no complications   Post-procedure details: wound care instructions given    Specimen 2 - Surgical pathology Differential Diagnosis: bcc vs scc - txpbx  Check Margins: yes    I, Talani Brazee, PA-C, have reviewed all documentation's for this visit.  The documentation on 02/16/22 for the exam, diagnosis, procedures and orders are all accurate and complete.

## 2022-03-09 DIAGNOSIS — H01023 Squamous blepharitis right eye, unspecified eyelid: Secondary | ICD-10-CM | POA: Diagnosis not present

## 2022-03-09 DIAGNOSIS — H01026 Squamous blepharitis left eye, unspecified eyelid: Secondary | ICD-10-CM | POA: Diagnosis not present

## 2022-03-09 DIAGNOSIS — H10403 Unspecified chronic conjunctivitis, bilateral: Secondary | ICD-10-CM | POA: Diagnosis not present

## 2022-03-09 DIAGNOSIS — H11042 Peripheral pterygium, stationary, left eye: Secondary | ICD-10-CM | POA: Diagnosis not present

## 2022-03-09 DIAGNOSIS — H11823 Conjunctivochalasis, bilateral: Secondary | ICD-10-CM | POA: Diagnosis not present

## 2022-03-09 DIAGNOSIS — H5319 Other subjective visual disturbances: Secondary | ICD-10-CM | POA: Diagnosis not present

## 2022-03-09 DIAGNOSIS — Z961 Presence of intraocular lens: Secondary | ICD-10-CM | POA: Diagnosis not present

## 2022-03-25 DIAGNOSIS — E7800 Pure hypercholesterolemia, unspecified: Secondary | ICD-10-CM | POA: Diagnosis not present

## 2022-03-25 DIAGNOSIS — I10 Essential (primary) hypertension: Secondary | ICD-10-CM | POA: Diagnosis not present

## 2022-03-25 DIAGNOSIS — N400 Enlarged prostate without lower urinary tract symptoms: Secondary | ICD-10-CM | POA: Diagnosis not present

## 2022-03-25 DIAGNOSIS — D649 Anemia, unspecified: Secondary | ICD-10-CM | POA: Diagnosis not present

## 2022-03-25 DIAGNOSIS — K219 Gastro-esophageal reflux disease without esophagitis: Secondary | ICD-10-CM | POA: Diagnosis not present

## 2022-03-25 DIAGNOSIS — R809 Proteinuria, unspecified: Secondary | ICD-10-CM | POA: Diagnosis not present

## 2022-03-25 DIAGNOSIS — I2510 Atherosclerotic heart disease of native coronary artery without angina pectoris: Secondary | ICD-10-CM | POA: Diagnosis not present

## 2022-03-25 DIAGNOSIS — I4891 Unspecified atrial fibrillation: Secondary | ICD-10-CM | POA: Diagnosis not present

## 2022-05-03 ENCOUNTER — Other Ambulatory Visit: Payer: Self-pay | Admitting: Cardiovascular Disease

## 2022-05-05 NOTE — Telephone Encounter (Signed)
Prescription refill request for Eliquis received. Indication:Afib Last office visit:needs appt Scr:1.0 Age: 76 Weight:62.1 kg  Prescription refilled

## 2022-05-06 ENCOUNTER — Ambulatory Visit: Payer: PPO | Admitting: Physician Assistant

## 2022-06-16 DIAGNOSIS — N400 Enlarged prostate without lower urinary tract symptoms: Secondary | ICD-10-CM | POA: Diagnosis not present

## 2022-06-16 DIAGNOSIS — I10 Essential (primary) hypertension: Secondary | ICD-10-CM | POA: Diagnosis not present

## 2022-06-16 DIAGNOSIS — I4891 Unspecified atrial fibrillation: Secondary | ICD-10-CM | POA: Diagnosis not present

## 2022-06-16 DIAGNOSIS — K219 Gastro-esophageal reflux disease without esophagitis: Secondary | ICD-10-CM | POA: Diagnosis not present

## 2022-06-16 DIAGNOSIS — E7800 Pure hypercholesterolemia, unspecified: Secondary | ICD-10-CM | POA: Diagnosis not present

## 2022-06-23 ENCOUNTER — Telehealth: Payer: Self-pay

## 2022-06-23 DIAGNOSIS — Z23 Encounter for immunization: Secondary | ICD-10-CM | POA: Diagnosis not present

## 2022-06-23 DIAGNOSIS — Z6822 Body mass index (BMI) 22.0-22.9, adult: Secondary | ICD-10-CM | POA: Diagnosis not present

## 2022-06-23 DIAGNOSIS — E7800 Pure hypercholesterolemia, unspecified: Secondary | ICD-10-CM | POA: Diagnosis not present

## 2022-06-23 DIAGNOSIS — I4891 Unspecified atrial fibrillation: Secondary | ICD-10-CM | POA: Diagnosis not present

## 2022-06-23 DIAGNOSIS — I2510 Atherosclerotic heart disease of native coronary artery without angina pectoris: Secondary | ICD-10-CM | POA: Diagnosis not present

## 2022-06-23 DIAGNOSIS — D649 Anemia, unspecified: Secondary | ICD-10-CM | POA: Diagnosis not present

## 2022-06-23 DIAGNOSIS — D692 Other nonthrombocytopenic purpura: Secondary | ICD-10-CM | POA: Diagnosis not present

## 2022-06-23 DIAGNOSIS — L989 Disorder of the skin and subcutaneous tissue, unspecified: Secondary | ICD-10-CM | POA: Diagnosis not present

## 2022-06-23 NOTE — Telephone Encounter (Signed)
He called and left a message requesting appt with Dr. Alvy Bimler. He just got a call from PCP and his Ferritin is 1,102. He was instructed to call the office for appt.

## 2022-06-24 ENCOUNTER — Other Ambulatory Visit: Payer: Self-pay | Admitting: Hematology and Oncology

## 2022-06-24 NOTE — Telephone Encounter (Signed)
I will place LOS to see him next week

## 2022-07-02 ENCOUNTER — Other Ambulatory Visit: Payer: Self-pay | Admitting: Hematology and Oncology

## 2022-07-02 ENCOUNTER — Encounter: Payer: Self-pay | Admitting: Vascular Surgery

## 2022-07-02 ENCOUNTER — Ambulatory Visit (HOSPITAL_COMMUNITY)
Admission: RE | Admit: 2022-07-02 | Discharge: 2022-07-02 | Disposition: A | Discharge status: Home / Self Care | Payer: PPO | Source: Ambulatory Visit | Attending: Vascular Surgery | Admitting: Vascular Surgery

## 2022-07-02 ENCOUNTER — Ambulatory Visit: Payer: PPO | Admitting: Vascular Surgery

## 2022-07-02 VITALS — BP 122/85 | HR 80 | Temp 97.9°F | Resp 16 | Ht 64.0 in | Wt 131.6 lb

## 2022-07-02 DIAGNOSIS — I7143 Infrarenal abdominal aortic aneurysm, without rupture: Secondary | ICD-10-CM

## 2022-07-02 NOTE — Progress Notes (Signed)
REASON FOR VISIT:   Follow-up after endovascular aneurysm repair  MEDICAL ISSUES:   S/P ENDOVASCULAR ANEURYSM REPAIR: Patient is doing well status post repair of a saccular infrarenal abdominal aortic aneurysm in 2019.  The aneurysm has shrunk in size and there is no evidence of endoleak.  He is not a smoker.  He is on a statin.  He is not on aspirin because he is on Eliquis.  He will need a yearly follow-up duplex scan which I have ordered.  I have informed him that I am retiring and therefore we will have him follow-up on the PA schedule.  He knows to call sooner if he has problems.  HPI:   Corey Garcia is a pleasant 76 y.o. male who I last saw on 08/07/2021.  He underwent repair of a 3.2 cm saccular infrarenal abdominal aortic aneurysm in 2019.  On a follow-up study had a type II endoleak.  The aneurysm measured 2.7 cm at his last visit in December of last year.  I recommended a follow-up visit in 9 months.  Since I saw him last, he denies any abdominal pain or back pain.  He denies any claudication, rest pain, or nonhealing ulcers.  He is not a smoker.  Past Medical History:  Diagnosis Date   Alcoholism ([place]) 03/15/2014   Anemia    Arthritis    all over, some ra   Bradycardia by electrocardiogram    BB decreased 11/12/11   CAD (coronary artery disease)    last cath=01/2008, PCI   CHF (congestive heart failure) (HCC)    Dyslipidemia    ED (erectile dysfunction)    viagra helps   GERD (gastroesophageal reflux disease)    HTN (hypertension)    Iron overload 03/15/2014   Liver disease due to alcohol ([place]) 03/15/2014   pt .denies at preop   Murmur, cardiac    faint early systolic grade 1/6 aortic ejection murmur   Myocardial infarction [facility])    Peripheral vascular disease (HCC)    Rib pain 04/16/2014   SCCA (squamous cell carcinoma) of skin 12/19/2020   Left Malar Cheek Lateral (well diff) (tx p bx)   SCCA (squamous cell carcinoma) of skin 12/19/2020   Right Scalp  Posterior (in situ) (tx p bx)   SCCA (squamous cell carcinoma) of skin 12/19/2020   Right Scalp Anterior (in situ) (tx p bx)   Skin cancer    basal cell ca X 4    Family History  Problem Relation Age of Onset   Heart failure Mother        heart disease   Heart disease Mother    Heart failure Father        heart disease   Heart disease Father    Heart disease Brother    Heart failure Brother        "bad heart valve"    SOCIAL HISTORY: Social History   Tobacco Use   Smoking status: Former    Types: Cigarettes    Quit date: 10/31/1982    Years since quitting: 39.6   Smokeless tobacco: Never  Substance Use Topics   Alcohol use: Yes    Alcohol/week: 28.0 standard drinks of alcohol    Types: 28 Cans of beer per week    Comment: "3-4 beers or hard apple ciders/ day" 01/10/2018    Allergies  Allergen Reactions   Lisinopril Other (See Comments)    PHOTOSENSITIVITY ON ARMS   Minocin [Minocycline Hcl]  Flank pain   Sulfa Antibiotics     Drop in BP   Thiazide-Type Diuretics Other (See Comments)    "Lowered my blood pressure"   Codeine Nausea Only    Current Outpatient Medications  Medication Sig Dispense Refill   AMOXICILLIN PO Take by mouth. Take prior to dental work.     apixaban (ELIQUIS) 5 MG TABS tablet TAKE ONE TABLET BY MOUTH TWICE DAILY 180 tablet 0   atorvastatin (LIPITOR) 20 MG tablet Take 20 mg by mouth daily.     bisoprolol (ZEBETA) 10 MG tablet Take 15 mg by mouth daily.      cholecalciferol (VITAMIN D) 1000 units tablet Take 1,000 Units by mouth daily.     Colloidal Oatmeal (GOLD BOND ECZEMA RELIEF) 2 % CREA See admin instructions.     Docusate Sodium 100 MG capsule      Fluorouracil (TOLAK) 4 % CREA Apply to face Monday-Sunday x 2 weeks (Patient not taking: Reported on 07/02/2022) 40 g 1   hydrocortisone (ANUSOL-HC) 2.5 % rectal cream Place 1 application rectally as needed for hemorrhoids or itching.     hydrocortisone 2.5 % cream Apply topically 2 (two)  times daily.     loratadine (CLARITIN) 10 MG tablet Take 10 mg by mouth daily.      pantoprazole (PROTONIX) 40 MG tablet Take 40 mg by mouth daily.      Probiotic Product (PROBIOTIC PO) Take 1 tablet by mouth daily.     sildenafil (VIAGRA) 100 MG tablet      simethicone (MYLICON) 80 MG chewable tablet 2 tablets (Patient not taking: Reported on 07/02/2022)     tamsulosin (FLOMAX) 0.4 MG CAPS capsule Take 0.4 mg by mouth daily.     tobramycin (TOBREX) 0.3 % ophthalmic solution SMARTSIG:In Eye(s) (Patient not taking: Reported on 07/02/2022)     No current facility-administered medications for this visit.    REVIEW OF SYSTEMS:  '[X]'$  denotes positive finding, '[ ]'$  denotes negative finding Cardiac  Comments:  Chest pain or chest pressure:    Shortness of breath upon exertion:    Short of breath when lying flat:    Irregular heart rhythm:        Vascular    Pain in calf, thigh, or hip brought on by ambulation:    Pain in feet at night that wakes you up from your sleep:     Blood clot in your veins:    Leg swelling:         Pulmonary    Oxygen at home:    Productive cough:     Wheezing:         Neurologic    Sudden weakness in arms or legs:     Sudden numbness in arms or legs:     Sudden onset of difficulty speaking or slurred speech:    Temporary loss of vision in one eye:     Problems with dizziness:         Gastrointestinal    Blood in stool:     Vomited blood:         Genitourinary    Burning when urinating:     Blood in urine:        Psychiatric    Major depression:         Hematologic    Bleeding problems:    Problems with blood clotting too easily:        Skin    Rashes or ulcers:  Constitutional    Fever or chills:     PHYSICAL EXAM:   Vitals:   07/02/22 0916  BP: 122/85  Pulse: 80  Resp: 16  Temp: 97.9 F (36.6 C)  TempSrc: Temporal  SpO2: 97%  Weight: 131 lb 9.6 oz (59.7 kg)  Height: '5\' 4"'$  (1.626 m)    GENERAL: The patient is a  well-nourished male, in no acute distress. The vital signs are documented above. CARDIAC: There is a regular rate and rhythm.  VASCULAR: I do not detect carotid bruits. He has palpable femoral pulses. He has no significant lower extremity swelling. PULMONARY: There is good air exchange bilaterally without wheezing or rales. ABDOMEN: Soft and non-tender with normal pitched bowel sounds.  MUSCULOSKELETAL: There are no major deformities or cyanosis. NEUROLOGIC: No focal weakness or paresthesias are detected. SKIN: There are no ulcers or rashes noted. PSYCHIATRIC: The patient has a normal affect.  DATA:    DUPLEX ABDOMINAL AORTA: I [place] interpreted his duplex of the abdominal aorta today.  The maximum diameter of the infrarenal aorta is 2.43 cm.  This has decreased in size compared to the study in December 2022 when it was 2.67 cm.  There is no evidence of endoleak.   Deitra Mayo [facility] [phone]

## 2022-07-03 ENCOUNTER — Inpatient Hospital Stay (HOSPITAL_BASED_OUTPATIENT_CLINIC_OR_DEPARTMENT_OTHER): Payer: PPO | Admitting: Hematology and Oncology

## 2022-07-03 ENCOUNTER — Other Ambulatory Visit: Payer: Self-pay | Admitting: Hematology and Oncology

## 2022-07-03 ENCOUNTER — Inpatient Hospital Stay: Payer: PPO | Attending: Hematology and Oncology

## 2022-07-03 ENCOUNTER — Inpatient Hospital Stay: Payer: PPO

## 2022-07-03 DIAGNOSIS — F1090 Alcohol use, unspecified, uncomplicated: Secondary | ICD-10-CM | POA: Diagnosis not present

## 2022-07-03 DIAGNOSIS — K709 Alcoholic liver disease, unspecified: Secondary | ICD-10-CM | POA: Insufficient documentation

## 2022-07-03 DIAGNOSIS — Z79899 Other long term (current) drug therapy: Secondary | ICD-10-CM | POA: Insufficient documentation

## 2022-07-03 DIAGNOSIS — D61818 Other pancytopenia: Secondary | ICD-10-CM | POA: Insufficient documentation

## 2022-07-03 LAB — CBC WITH DIFFERENTIAL (CANCER CENTER ONLY)
Abs Immature Granulocytes: 0.02 10*3/uL (ref 0.00–0.07)
Basophils Absolute: 0 10*3/uL (ref 0.0–0.1)
Basophils Relative: 1 %
Eosinophils Absolute: 0.1 10*3/uL (ref 0.0–0.5)
Eosinophils Relative: 2 %
HCT: 32.6 % — ABNORMAL LOW (ref 39.0–52.0)
Hemoglobin: 11 g/dL — ABNORMAL LOW (ref 13.0–17.0)
Immature Granulocytes: 1 %
Lymphocytes Relative: 34 %
Lymphs Abs: 1.2 10*3/uL (ref 0.7–4.0)
MCH: 36.7 pg — ABNORMAL HIGH (ref 26.0–34.0)
MCHC: 33.7 g/dL (ref 30.0–36.0)
MCV: 108.7 fL — ABNORMAL HIGH (ref 80.0–100.0)
Monocytes Absolute: 0.6 10*3/uL (ref 0.1–1.0)
Monocytes Relative: 16 %
Neutro Abs: 1.6 10*3/uL — ABNORMAL LOW (ref 1.7–7.7)
Neutrophils Relative %: 46 %
Platelet Count: 126 10*3/uL — ABNORMAL LOW (ref 150–400)
RBC: 3 MIL/uL — ABNORMAL LOW (ref 4.22–5.81)
RDW: 12.5 % (ref 11.5–15.5)
WBC Count: 3.5 10*3/uL — ABNORMAL LOW (ref 4.0–10.5)
nRBC: 0 % (ref 0.0–0.2)

## 2022-07-03 LAB — FERRITIN: Ferritin: 864 ng/mL — ABNORMAL HIGH (ref 24–336)

## 2022-07-06 ENCOUNTER — Encounter: Payer: Self-pay | Admitting: Hematology and Oncology

## 2022-07-06 NOTE — Assessment & Plan Note (Signed)
We discussed importance of the patient stopping alcohol use prior to returning back for phlebotomy

## 2022-07-06 NOTE — Progress Notes (Signed)
Corey Garcia OFFICE PROGRESS NOTE  Corey Del, DO  ASSESSMENT & PLAN:  Hemochromatosis Who presented today for phlebotomy Unfortunately, due to his recent alcohol use, he has developed pancytopenia We discussed the risk and benefits of phlebotomy in the setting of anemia Ultimately, he is in agreement to delay phlebotomy for another time We discussed importance of the patient refraining from alcohol use as alcohol can also cause worsening risk of iron overload  Pancytopenia, acquired ([place]) I suspect he has developed acquired pancytopenia due to alcohol use I will check vitamin B12 level As above, I do not recommend phlebotomy when he is anemic  Liver disease due to alcohol We discussed importance of the patient stopping alcohol use prior to returning back for phlebotomy  Orders Placed This Encounter  Procedures   Ferritin    Standing Status:   Future    Standing Expiration Date:   07/06/2023   CMP ([place] only)    Standing Status:   Future    Standing Expiration Date:   07/07/2023   CBC with Differential ([place] Only)    Standing Status:   Future    Standing Expiration Date:   07/07/2023   Vitamin B12    Standing Status:   Future    Standing Expiration Date:   07/07/2023    The total time spent in the appointment was 20 minutes encounter with patients including review of chart and various tests results, discussions about plan of care and coordination of care plan   All questions were answered. The patient knows to call the clinic with any problems, questions or concerns. No barriers to learning was detected.    Corey Lark, MD 11/6/20237:37 AM  INTERVAL HISTORY: Corey Garcia 76 y.o. male returns for return visit and phlebotomy I have not seen him for a while Reportedly, his ferritin level was over 1000 when he saw his primary care doctor recently Unfortunately, the patient has been drinking 3-4 beers on a regular basis, almost  daily We reviewed CBC results today The patient denies any recent signs or symptoms of bleeding such as spontaneous epistaxis, hematuria or hematochezia.   SUMMARY OF HEMATOLOGIC HISTORY:  The abnormal blood work was discovered during a routine visit with his primary care provider. I have the opportunity to review his blood work dated back for a while. His white blood cell count ranged from 3.1-5.6, hemoglobin from 11.3-12.6, MCV from 103-104.5, and ferritin as high as 910, along with abnormal liver function tests. He complains of fatigue, chronic headaches and chronic joint pain. The patient has history of basal cell carcinoma removed 4 times. He had been on topical steroid cream for eczema and complained of diffuse skin rash on his sun exposed area and face. He also has a significant easy bruising. The patient has been on antiplatelet agents along time for coronary artery disease. The patient denies any recent signs or symptoms of bleeding such as spontaneous epistaxis, hematuria or hematochezia. 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  It was performed to evaluate for macrocytic anemia with mild pancytopenia In May 2019, he had CT imaging which show early signs of liver cirrhosis.  I have reviewed the past medical history, past surgical history, social history and family history with the patient and they are unchanged from previous note.  ALLERGIES:  is allergic to lisinopril, minocin [minocycline hcl], sulfa antibiotics, thiazide-type diuretics, and codeine.  MEDICATIONS:  Current Outpatient Medications  Medication Sig Dispense Refill   AMOXICILLIN PO Take by mouth. Take prior to dental work.     apixaban (ELIQUIS) 5 MG TABS tablet TAKE ONE TABLET BY MOUTH TWICE DAILY 180 tablet 0    atorvastatin (LIPITOR) 20 MG tablet Take 20 mg by mouth daily.     bisoprolol (ZEBETA) 10 MG tablet Take 15 mg by mouth daily.      cholecalciferol (VITAMIN D) 1000 units tablet Take 1,000 Units by mouth daily.     Colloidal Oatmeal (GOLD BOND ECZEMA RELIEF) 2 % CREA See admin instructions.     Docusate Sodium 100 MG capsule      Fluorouracil (TOLAK) 4 % CREA Apply to face Monday-Sunday x 2 weeks (Patient not taking: Reported on 07/02/2022) 40 g 1   hydrocortisone (ANUSOL-HC) 2.5 % rectal cream Place 1 application rectally as needed for hemorrhoids or itching.     hydrocortisone 2.5 % cream Apply topically 2 (two) times daily.     loratadine (CLARITIN) 10 MG tablet Take 10 mg by mouth daily.      pantoprazole (PROTONIX) 40 MG tablet Take 40 mg by mouth daily.      Probiotic Product (PROBIOTIC PO) Take 1 tablet by mouth daily.     sildenafil (VIAGRA) 100 MG tablet      simethicone (MYLICON) 80 MG chewable tablet 2 tablets (Patient not taking: Reported on 07/02/2022)     tamsulosin (FLOMAX) 0.4 MG CAPS capsule Take 0.4 mg by mouth daily.     tobramycin (TOBREX) 0.3 % ophthalmic solution SMARTSIG:In Eye(s) (Patient not taking: Reported on 07/02/2022)     No current facility-administered medications for this visit.     REVIEW OF SYSTEMS:   Constitutional: Denies fevers, chills or night sweats Eyes: Denies blurriness of vision Ears, nose, mouth, throat, and face: Denies mucositis or sore throat Respiratory: Denies cough, dyspnea or wheezes Cardiovascular: Denies palpitation, chest discomfort or lower extremity swelling Gastrointestinal:  Denies nausea, heartburn or change in bowel habits Skin: Denies abnormal skin rashes Lymphatics: Denies new lymphadenopathy or easy bruising Neurological:Denies numbness, tingling or new weaknesses Behavioral/Psych: Mood is stable, no new changes  All other systems were reviewed with the patient and are negative.  PHYSICAL EXAMINATION: ECOG PERFORMANCE  STATUS: 0 - Asymptomatic  Vitals:   07/03/22 1452  BP: 134/66  Pulse: 84  Resp: 18  Temp: (!) 97.5 F (36.4 C)  SpO2: 100%   There were no vitals filed for this visit.  GENERAL:alert, no distress and comfortable NEURO: alert & oriented x 3 with fluent speech, no focal motor/sensory deficits  LABORATORY DATA:  I have reviewed the data as listed     Component Value Date/Time   NA 133 (L) 07/18/2020 0930   NA 138 06/20/2015 1303   K 4.2 07/18/2020 0930   K 4.6 06/20/2015 1303   CL 95 (L) 07/18/2020 0930   CO2 25 07/18/2020 0930   CO2 28 06/20/2015 1303   GLUCOSE 96 07/18/2020 0930   GLUCOSE 88 11/01/2018 1040   GLUCOSE 90 06/20/2015 1303   BUN 13 07/18/2020 0930   BUN 8.3 06/20/2015 1303   CREATININE 0.95 07/18/2020 0930   CREATININE 0.9 06/20/2015 1303   CALCIUM 9.0 07/18/2020 0930   CALCIUM 9.1 06/20/2015 1303   PROT 7.1 07/18/2020 0930   PROT 6.9 06/20/2015 1303   ALBUMIN 4.3 07/18/2020 0930   ALBUMIN 3.3 (  L) 06/20/2015 1303   AST 43 (H) 07/18/2020 0930   AST 88 (H) 06/20/2015 1303   ALT 28 07/18/2020 0930   ALT 82 (H) 06/20/2015 1303   ALKPHOS 72 07/18/2020 0930   ALKPHOS 59 06/20/2015 1303   BILITOT 0.5 07/18/2020 0930   BILITOT 0.45 06/20/2015 1303   GFRNONAA 79 07/18/2020 0930   GFRAA 91 07/18/2020 0930    No results found for: "SPEP", "UPEP"  Lab Results  Component Value Date   WBC 3.5 (L) 07/03/2022   NEUTROABS 1.6 (L) 07/03/2022   HGB 11.0 (L) 07/03/2022   HCT 32.6 (L) 07/03/2022   MCV 108.7 (H) 07/03/2022   PLT 126 (L) 07/03/2022      Chemistry      Component Value Date/Time   NA 133 (L) 07/18/2020 0930   NA 138 06/20/2015 1303   K 4.2 07/18/2020 0930   K 4.6 06/20/2015 1303   CL 95 (L) 07/18/2020 0930   CO2 25 07/18/2020 0930   CO2 28 06/20/2015 1303   BUN 13 07/18/2020 0930   BUN 8.3 06/20/2015 1303   CREATININE 0.95 07/18/2020 0930   CREATININE 0.9 06/20/2015 1303      Component Value Date/Time   CALCIUM 9.0 07/18/2020 0930    CALCIUM 9.1 06/20/2015 1303   ALKPHOS 72 07/18/2020 0930   ALKPHOS 59 06/20/2015 1303   AST 43 (H) 07/18/2020 0930   AST 88 (H) 06/20/2015 1303   ALT 28 07/18/2020 0930   ALT 82 (H) 06/20/2015 1303   BILITOT 0.5 07/18/2020 0930   BILITOT 0.45 06/20/2015 1303

## 2022-07-06 NOTE — Assessment & Plan Note (Signed)
Who presented today for phlebotomy Unfortunately, due to his recent alcohol use, he has developed pancytopenia We discussed the risk and benefits of phlebotomy in the setting of anemia Ultimately, he is in agreement to delay phlebotomy for another time We discussed importance of the patient refraining from alcohol use as alcohol can also cause worsening risk of iron overload

## 2022-07-06 NOTE — Assessment & Plan Note (Signed)
I suspect he has developed acquired pancytopenia due to alcohol use I will check vitamin B12 level As above, I do not recommend phlebotomy when he is anemic

## 2022-07-15 DIAGNOSIS — Z6823 Body mass index (BMI) 23.0-23.9, adult: Secondary | ICD-10-CM | POA: Diagnosis not present

## 2022-07-15 DIAGNOSIS — Z1389 Encounter for screening for other disorder: Secondary | ICD-10-CM | POA: Diagnosis not present

## 2022-07-15 DIAGNOSIS — Z0000 Encounter for general adult medical examination without abnormal findings: Secondary | ICD-10-CM | POA: Diagnosis not present

## 2022-08-03 ENCOUNTER — Other Ambulatory Visit: Payer: Self-pay | Admitting: Cardiovascular Disease

## 2022-08-03 NOTE — Telephone Encounter (Signed)
Prescription refill request for Eliquis received. Indication: AF Last office visit: 03/06/21  Jerilynn Mages Croitoru MD Scr: 1.23 on 06/23/22 Age: 76 Weight: 63.6kg  Based on above findings Eliquis '5mg'$  twice daily is the appropriate dose.  Refill approved.  Has appt 08/20/22 with Dr C.

## 2022-08-04 ENCOUNTER — Telehealth: Payer: Self-pay

## 2022-08-04 ENCOUNTER — Other Ambulatory Visit: Payer: Self-pay | Admitting: Hematology and Oncology

## 2022-08-04 ENCOUNTER — Inpatient Hospital Stay: Payer: PPO

## 2022-08-04 ENCOUNTER — Inpatient Hospital Stay: Payer: PPO | Attending: Hematology and Oncology

## 2022-08-04 ENCOUNTER — Other Ambulatory Visit: Payer: Self-pay

## 2022-08-04 ENCOUNTER — Encounter: Payer: Self-pay | Admitting: Hematology and Oncology

## 2022-08-04 ENCOUNTER — Inpatient Hospital Stay: Payer: PPO | Admitting: Hematology and Oncology

## 2022-08-04 DIAGNOSIS — K709 Alcoholic liver disease, unspecified: Secondary | ICD-10-CM

## 2022-08-04 DIAGNOSIS — D61818 Other pancytopenia: Secondary | ICD-10-CM | POA: Diagnosis not present

## 2022-08-04 DIAGNOSIS — Z79899 Other long term (current) drug therapy: Secondary | ICD-10-CM | POA: Diagnosis not present

## 2022-08-04 DIAGNOSIS — D539 Nutritional anemia, unspecified: Secondary | ICD-10-CM | POA: Diagnosis not present

## 2022-08-04 DIAGNOSIS — I9789 Other postprocedural complications and disorders of the circulatory system, not elsewhere classified: Secondary | ICD-10-CM

## 2022-08-04 LAB — CMP (CANCER CENTER ONLY)
ALT: 18 U/L (ref 0–44)
AST: 31 U/L (ref 15–41)
Albumin: 4 g/dL (ref 3.5–5.0)
Alkaline Phosphatase: 56 U/L (ref 38–126)
Anion gap: 5 (ref 5–15)
BUN: 9 mg/dL (ref 8–23)
CO2: 29 mmol/L (ref 22–32)
Calcium: 9.4 mg/dL (ref 8.9–10.3)
Chloride: 103 mmol/L (ref 98–111)
Creatinine: 0.97 mg/dL (ref 0.61–1.24)
GFR, Estimated: >60 mL/min (ref >60)
Glucose, Bld: 82 mg/dL (ref 70–99)
Potassium: 4.3 mmol/L (ref 3.5–5.1)
Sodium: 137 mmol/L (ref 135–145)
Total Bilirubin: 0.4 mg/dL (ref 0.3–1.2)
Total Protein: 7 g/dL (ref 6.5–8.1)

## 2022-08-04 LAB — CBC WITH DIFFERENTIAL (CANCER CENTER ONLY)
Abs Immature Granulocytes: 0.01 10*3/uL (ref 0.00–0.07)
Basophils Absolute: 0 10*3/uL (ref 0.0–0.1)
Basophils Relative: 1 %
Eosinophils Absolute: 0.1 10*3/uL (ref 0.0–0.5)
Eosinophils Relative: 2 %
HCT: 33.1 % — ABNORMAL LOW (ref 39.0–52.0)
Hemoglobin: 10.9 g/dL — ABNORMAL LOW (ref 13.0–17.0)
Immature Granulocytes: 0 %
Lymphocytes Relative: 34 %
Lymphs Abs: 1.5 10*3/uL (ref 0.7–4.0)
MCH: 37.5 pg — ABNORMAL HIGH (ref 26.0–34.0)
MCHC: 32.9 g/dL (ref 30.0–36.0)
MCV: 113.7 fL — ABNORMAL HIGH (ref 80.0–100.0)
Monocytes Absolute: 0.7 10*3/uL (ref 0.1–1.0)
Monocytes Relative: 17 %
Neutro Abs: 2 10*3/uL (ref 1.7–7.7)
Neutrophils Relative %: 46 %
Platelet Count: 131 10*3/uL — ABNORMAL LOW (ref 150–400)
RBC: 2.91 MIL/uL — ABNORMAL LOW (ref 4.22–5.81)
RDW: 11.9 % (ref 11.5–15.5)
WBC Count: 4.4 10*3/uL (ref 4.0–10.5)
nRBC: 0 % (ref 0.0–0.2)

## 2022-08-04 LAB — VITAMIN B12: Vitamin B-12: 372 pg/mL (ref 180–914)

## 2022-08-04 LAB — GENETIC SCREENING ORDER

## 2022-08-04 LAB — FERRITIN: Ferritin: 399 ng/mL — ABNORMAL HIGH (ref 24–336)

## 2022-08-04 NOTE — Assessment & Plan Note (Addendum)
He has persistent macrocytic anemia I have ordered vitamin B12 level, result is normal Observe only

## 2022-08-04 NOTE — Assessment & Plan Note (Signed)
This is multifactorial, likely due to recent alcoholism With reduced alcohol intake, his leukopenia has resolved and thrombocytopenia has almost improved back to normal Observe closely

## 2022-08-04 NOTE — Telephone Encounter (Signed)
Called and given below message. He verbalized understanding. 

## 2022-08-04 NOTE — Assessment & Plan Note (Signed)
He presented today for phlebotomy Unfortunately, due to persistent pancytopenia, his treatment is canceled I would like to see hemoglobin at least over 14 before we proceed with phlebotomy We discussed importance of the patient refraining from alcohol use as alcohol can also cause worsening risk of iron overload I plan to give him a few months of recovery and see him back in the spring for repeat blood work and hopefully phlebotomy at the time

## 2022-08-04 NOTE — Telephone Encounter (Signed)
-----   Message from [name], MD sent at 08/04/2022  3:32 PM EST ----- Pls call him Iron level has dropped  B12 is ok Nothing needs to be done I will see him next year as scheduiled

## 2022-08-04 NOTE — Progress Notes (Signed)
[place] OFFICE PROGRESS NOTE  Lurline Del, DO  ASSESSMENT & PLAN:  Hemochromatosis He presented today for phlebotomy Unfortunately, due to persistent pancytopenia, his treatment is canceled I would like to see hemoglobin at least over 14 before we proceed with phlebotomy We discussed importance of the patient refraining from alcohol use as alcohol can also cause worsening risk of iron overload I plan to give him a few months of recovery and see him back in the spring for repeat blood work and hopefully phlebotomy at the time  Pancytopenia, acquired [facility]) This is multifactorial, likely due to recent alcoholism With reduced alcohol intake, his leukopenia has resolved and thrombocytopenia has almost improved back to normal Observe closely   Macrocytic anemia He has persistent macrocytic anemia I have ordered vitamin B12 level, result is normal Observe only  Orders Placed This Encounter  Procedures   Genetic Screening Order   CBC with Differential/Platelet    Standing Status:   Standing    Number of Occurrences:   22    Standing Expiration Date:   08/05/2023   Ferritin    Standing Status:   Standing    Number of Occurrences:   3    Standing Expiration Date:   08/05/2023    The total time spent in the appointment was 20 minutes encounter with patients including review of chart and various tests results, discussions about plan of care and coordination of care plan   All questions were answered. The patient knows to call the clinic with any problems, questions or concerns. No barriers to learning was detected.    [name], MD 12/5/20233:33 PM  INTERVAL HISTORY: Corey Garcia 76 y.o. male returns for further evaluation and phlebotomy due to iron overload Since last time I saw him, he has lost weight but has gained back some weight For some reason, he developed diarrhea around Thanksgiving but that has since resolved Even though his blood pressure  is elevated here but his documented blood pressure from home is normal He is able to cut his alcohol intake to less than 1 alcoholic drink per day  SUMMARY OF HEMATOLOGIC HISTORY:  The abnormal blood work was discovered during a routine visit with his primary care provider. I have the opportunity to review his blood work dated back for a while. His white blood cell count ranged from 3.1-5.6, hemoglobin from 11.3-12.6, MCV from 103-104.5, and ferritin as high as 910, along with abnormal liver function tests. He complains of fatigue, chronic headaches and chronic joint pain. The patient has history of basal cell carcinoma removed 4 times. He had been on topical steroid cream for eczema and complained of diffuse skin rash on his sun exposed area and face. He also has a significant easy bruising. The patient has been on antiplatelet agents along time for coronary artery disease. The patient denies any recent signs or symptoms of bleeding such as spontaneous epistaxis, hematuria or hematochezia. 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  It was performed to evaluate for macrocytic anemia with mild pancytopenia In May 2019, he had CT imaging which show early signs of liver cirrhosis.  I have reviewed the past medical history, past surgical history, social history and family history with the patient and they are unchanged from previous note.  ALLERGIES:  is allergic to lisinopril, minocin [  minocycline hcl], sulfa antibiotics, thiazide-type diuretics, and codeine.  MEDICATIONS:  Current Outpatient Medications  Medication Sig Dispense Refill   AMOXICILLIN PO Take by mouth. Take prior to dental work.     apixaban (ELIQUIS) 5 MG TABS tablet TAKE ONE TABLET BY MOUTH TWICE DAILY 180 tablet 1   atorvastatin  (LIPITOR) 20 MG tablet Take 20 mg by mouth daily.     bisoprolol (ZEBETA) 10 MG tablet Take 15 mg by mouth daily.      cholecalciferol (VITAMIN D) 1000 units tablet Take 1,000 Units by mouth daily.     Colloidal Oatmeal (GOLD BOND ECZEMA RELIEF) 2 % CREA See admin instructions.     hydrocortisone (ANUSOL-HC) 2.5 % rectal cream Place 1 application rectally as needed for hemorrhoids or itching.     loratadine (CLARITIN) 10 MG tablet Take 10 mg by mouth daily.      pantoprazole (PROTONIX) 40 MG tablet Take 40 mg by mouth daily.      tamsulosin (FLOMAX) 0.4 MG CAPS capsule Take 0.4 mg by mouth daily.     No current facility-administered medications for this visit.     REVIEW OF SYSTEMS:   Constitutional: Denies fevers, chills or night sweats Eyes: Denies blurriness of vision Ears, nose, mouth, throat, and face: Denies mucositis or sore throat Respiratory: Denies cough, dyspnea or wheezes Cardiovascular: Denies palpitation, chest discomfort or lower extremity swelling Gastrointestinal:  Denies nausea, heartburn or change in bowel habits Skin: Denies abnormal skin rashes Lymphatics: Denies new lymphadenopathy or easy bruising Neurological:Denies numbness, tingling or new weaknesses Behavioral/Psych: Mood is stable, no new changes  All other systems were reviewed with the patient and are negative.  PHYSICAL EXAMINATION: ECOG PERFORMANCE STATUS: 0 - Asymptomatic  Vitals:   08/04/22 1132  BP: (!) 154/71  Pulse: 65  Resp: 18  Temp: (!) 97.4 F (36.3 C)  SpO2: 100%   Filed Weights   08/04/22 1132  Weight: 139 lb 9.6 oz (63.3 kg)    GENERAL:alert, no distress and comfortable NEURO: alert & oriented x 3 with fluent speech, no focal motor/sensory deficits  LABORATORY DATA:  I have reviewed the data as listed     Component Value Date/Time   NA 137 08/04/2022 1114   NA 133 (L) 07/18/2020 0930   NA 138 06/20/2015 1303   K 4.3 08/04/2022 1114   K 4.6 06/20/2015 1303   CL 103  08/04/2022 1114   CO2 29 08/04/2022 1114   CO2 28 06/20/2015 1303   GLUCOSE 82 08/04/2022 1114   GLUCOSE 90 06/20/2015 1303   BUN 9 08/04/2022 1114   BUN 13 07/18/2020 0930   BUN 8.3 06/20/2015 1303   CREATININE 0.97 08/04/2022 1114   CREATININE 0.9 06/20/2015 1303   CALCIUM 9.4 08/04/2022 1114   CALCIUM 9.1 06/20/2015 1303   PROT 7.0 08/04/2022 1114   PROT 7.1 07/18/2020 0930   PROT 6.9 06/20/2015 1303   ALBUMIN 4.0 08/04/2022 1114   ALBUMIN 4.3 07/18/2020 0930   ALBUMIN 3.3 (L) 06/20/2015 1303   AST 31 08/04/2022 1114   AST 88 (H) 06/20/2015 1303   ALT 18 08/04/2022 1114   ALT 82 (H) 06/20/2015 1303   ALKPHOS 56 08/04/2022 1114   ALKPHOS 59 06/20/2015 1303   BILITOT 0.4 08/04/2022 1114   BILITOT 0.45 06/20/2015 1303   GFRNONAA >60 08/04/2022 1114   GFRAA 91 07/18/2020 0930    No results found for: "SPEP", "UPEP"  Lab Results  Component Value Date   WBC 4.4 08/04/2022  NEUTROABS 2.0 08/04/2022   HGB 10.9 (L) 08/04/2022   HCT 33.1 (L) 08/04/2022   MCV 113.7 (H) 08/04/2022   PLT 131 (L) 08/04/2022      Chemistry      Component Value Date/Time   NA 137 08/04/2022 1114   NA 133 (L) 07/18/2020 0930   NA 138 06/20/2015 1303   K 4.3 08/04/2022 1114   K 4.6 06/20/2015 1303   CL 103 08/04/2022 1114   CO2 29 08/04/2022 1114   CO2 28 06/20/2015 1303   BUN 9 08/04/2022 1114   BUN 13 07/18/2020 0930   BUN 8.3 06/20/2015 1303   CREATININE 0.97 08/04/2022 1114   CREATININE 0.9 06/20/2015 1303      Component Value Date/Time   CALCIUM 9.4 08/04/2022 1114   CALCIUM 9.1 06/20/2015 1303   ALKPHOS 56 08/04/2022 1114   ALKPHOS 59 06/20/2015 1303   AST 31 08/04/2022 1114   AST 88 (H) 06/20/2015 1303   ALT 18 08/04/2022 1114   ALT 82 (H) 06/20/2015 1303   BILITOT 0.4 08/04/2022 1114   BILITOT 0.45 06/20/2015 1303

## 2022-08-11 DIAGNOSIS — I350 Nonrheumatic aortic (valve) stenosis: Secondary | ICD-10-CM | POA: Diagnosis not present

## 2022-08-11 DIAGNOSIS — D61818 Other pancytopenia: Secondary | ICD-10-CM | POA: Diagnosis not present

## 2022-08-11 DIAGNOSIS — Z7901 Long term (current) use of anticoagulants: Secondary | ICD-10-CM | POA: Diagnosis not present

## 2022-08-11 DIAGNOSIS — I2510 Atherosclerotic heart disease of native coronary artery without angina pectoris: Secondary | ICD-10-CM | POA: Diagnosis not present

## 2022-08-11 DIAGNOSIS — I10 Essential (primary) hypertension: Secondary | ICD-10-CM | POA: Diagnosis not present

## 2022-08-11 DIAGNOSIS — Z9889 Other specified postprocedural states: Secondary | ICD-10-CM | POA: Diagnosis not present

## 2022-08-11 DIAGNOSIS — Z87891 Personal history of nicotine dependence: Secondary | ICD-10-CM | POA: Diagnosis not present

## 2022-08-11 DIAGNOSIS — Z8679 Personal history of other diseases of the circulatory system: Secondary | ICD-10-CM | POA: Diagnosis not present

## 2022-08-11 DIAGNOSIS — Z6823 Body mass index (BMI) 23.0-23.9, adult: Secondary | ICD-10-CM | POA: Diagnosis not present

## 2022-08-20 ENCOUNTER — Encounter: Payer: Self-pay | Admitting: Cardiovascular Disease

## 2022-08-20 ENCOUNTER — Ambulatory Visit: Payer: PPO | Attending: Cardiovascular Disease | Admitting: Cardiovascular Disease

## 2022-08-20 VITALS — BP 124/60 | HR 77 | Ht 64.0 in | Wt 138.8 lb

## 2022-08-20 DIAGNOSIS — Z9889 Other specified postprocedural states: Secondary | ICD-10-CM

## 2022-08-20 DIAGNOSIS — I25118 Atherosclerotic heart disease of native coronary artery with other forms of angina pectoris: Secondary | ICD-10-CM

## 2022-08-20 DIAGNOSIS — I350 Nonrheumatic aortic (valve) stenosis: Secondary | ICD-10-CM | POA: Diagnosis not present

## 2022-08-20 DIAGNOSIS — I4821 Permanent atrial fibrillation: Secondary | ICD-10-CM

## 2022-08-20 DIAGNOSIS — I10 Essential (primary) hypertension: Secondary | ICD-10-CM

## 2022-08-20 DIAGNOSIS — Z8679 Personal history of other diseases of the circulatory system: Secondary | ICD-10-CM

## 2022-08-20 DIAGNOSIS — E7800 Pure hypercholesterolemia, unspecified: Secondary | ICD-10-CM | POA: Diagnosis not present

## 2022-08-20 DIAGNOSIS — I4891 Unspecified atrial fibrillation: Secondary | ICD-10-CM

## 2022-08-20 NOTE — Progress Notes (Signed)
Cardiology Office Note   Date:  08/26/2022   ID:  Corey Garcia, Corey Garcia 06-05-1946, MRN [id number]  PCP:  Lurline Del, DO  Cardiologist:  Dr.Nerissa Constantin   Chief Complaint  Patient presents with   Atrial Fibrillation      History of Present Illness: Corey Garcia is a 76 y.o. male with longstanding persistent (probably permanent) atrial fibrillation, CAD, vasovagal syncope, moderate calcific aortic valve stenosis, AAA s/p EVAR (Dr. Scot Dock), hypertension, hyperlipidemia, chronic liver disease. He has not required coronary intervention since 2009 when he had cutting balloon atherectomy of the OM2 and DES to the LAD, residual 50% RCA stenosis. He had a low risk nuclear study April 2014.    He is doing okay.  He has adjusted to living with his grandson, after his wife and his sister passed away last year.  The patient specifically denies any chest pain at rest or with exertion, dyspnea at rest or with exertion, orthopnea, paroxysmal nocturnal dyspnea, syncope, palpitations, focal neurological deficits, intermittent claudication, lower extremity edema, unexplained weight gain, cough, hemoptysis or wheezing.  He denies falls or bleeding problems.  His echocardiogram performed in July 2022 showed mildly elevated aortic valve gradients (mean gradient 10 mmHg), although the dimensionless index 0.29 suggested that he may have paradoxical low-flow low gradient moderate AS.  His EVAR ultrasound follow-up in November 2023 shows that the endoleak is no longer seen in the abdominal aorta has decreased in diameter (max 2.4 cm).  His most recent lipid profile performed just a few weeks ago there is an LDL cholesterol of 62.  Past Medical History:  Diagnosis Date   Alcoholism ([place]) 03/15/2014   Anemia    Arthritis    all over, some ra   Bradycardia by electrocardiogram    BB decreased 11/12/11   CAD (coronary artery disease)    last cath=01/2008, PCI   CHF (congestive heart failure) ([place])     Dyslipidemia    ED (erectile dysfunction)    viagra helps   GERD (gastroesophageal reflux disease)    HTN (hypertension)    Iron overload 03/15/2014   Liver disease due to alcohol ([place]) 03/15/2014   pt .denies at preop   Murmur, cardiac    faint early systolic grade 1/6 aortic ejection murmur   Myocardial infarction [facility])    Peripheral vascular disease (HCC)    Rib pain 04/16/2014   SCCA (squamous cell carcinoma) of skin 12/19/2020   Left Malar Cheek Lateral (well diff) (tx p bx)   SCCA (squamous cell carcinoma) of skin 12/19/2020   Right Scalp Posterior (in situ) (tx p bx)   SCCA (squamous cell carcinoma) of skin 12/19/2020   Right Scalp Anterior (in situ) (tx p bx)   Skin cancer    basal cell ca X 4    Past Surgical History:  Procedure Laterality Date   ABDOMINAL AORTIC ENDOVASCULAR STENT GRAFT N/A 01/13/2018   Procedure: ABDOMINAL AORTIC ENDOVASCULAR STENT GRAFT;  Surgeon: Angelia Mould, MD;  Location: [facility] OR;  Service: Vascular;  Laterality: N/A;   CORONARY ANGIOPLASTY WITH STENT PLACEMENT  01/31/2008   PCI with cutting balloon arthrectomy of the ostium of the circ obtuse marg 2 vessel   CORONARY ANGIOPLASTY WITH STENT PLACEMENT  01/27/08   nl LV function, PTCA/stenting of LAD with 3x44m Cypher post dilated 3.159m  EYE SURGERY     cataract bil   FRACTURE SURGERY     right [place] [age][place] old   [place]  03/27/2021   KNEE ARTHROSCOPY Left    MOHS SURGERY     x4   NASAL SEPTUM SURGERY     x2   TOTAL KNEE ARTHROPLASTY Left 11/08/2018   Procedure: TOTAL KNEE ARTHROPLASTY;  Surgeon: Renette Butters, MD;  Location: [facility] ORS;  Service: Orthopedics;  Laterality: Left;     Current Outpatient Medications  Medication Sig Dispense Refill   apixaban (ELIQUIS) 5 MG TABS tablet TAKE ONE TABLET BY MOUTH TWICE DAILY 180 tablet 1   atorvastatin (LIPITOR) 20 MG tablet Take 20 mg by mouth daily.     bisacodyl (DULCOLAX) 5 MG EC tablet Take 5  mg by mouth daily as needed for moderate constipation.     bisoprolol (ZEBETA) 10 MG tablet Take 15 mg by mouth daily.      cholecalciferol (VITAMIN D) 1000 units tablet Take 1,000 Units by mouth daily.     Colloidal Oatmeal (GOLD BOND ECZEMA RELIEF) 2 % CREA See admin instructions.     hydrocortisone (ANUSOL-HC) 2.5 % rectal cream Place 1 application rectally as needed for hemorrhoids or itching.     loratadine (CLARITIN) 10 MG tablet Take 10 mg by mouth daily.      pantoprazole (PROTONIX) 40 MG tablet Take 40 mg by mouth daily.      Probiotic Product (PROBIOTIC DAILY PO) Take 1 tablet by mouth daily in the afternoon.     tamsulosin (FLOMAX) 0.4 MG CAPS capsule Take 0.4 mg by mouth daily.     amoxicillin (AMOXIL) 500 MG capsule Take 500 mg by mouth Once PRN. Take 4 capsules for dental procedures (Patient not taking: Reported on 08/20/2022)     AMOXICILLIN PO Take by mouth. Take prior to dental work. (Patient not taking: Reported on 08/20/2022)     No current facility-administered medications for this visit.    Allergies:   Lisinopril, Minocin [minocycline hcl], Sulfa antibiotics, Thiazide-type diuretics, and Codeine    Social History:  The patient  reports that he quit smoking about 39 years ago. His smoking use included cigarettes. He has never used smokeless tobacco. He reports current alcohol use of about 28.0 standard drinks of alcohol per week. He reports that he does not use drugs.   Family History:  The patient's family history includes Heart disease in his brother, father, and mother; Heart failure in his brother, father, and mother.    ROS: Please see history of present illness. All other systems are reviewed and are negative.  PHYSICAL EXAM: VS:  BP 124/60 (BP Location: Left Arm, Patient Position: Sitting, Cuff Size: Normal)   Pulse 77   Ht _0  (1.626 m)   Wt 138 lb 12.8 oz (63 kg)   SpO2 98%   BMI 23.82 kg/m  , BMI Body mass index is 23.82 kg/m.    General: Alert,  oriented x3, no distress, appears well Head: no evidence of trauma, PERRL, EOMI, no exophtalmos or lid lag, no myxedema, no xanthelasma; normal ears, nose and oropharynx Neck: normal jugular venous pulsations and no hepatojugular reflux; brisk carotid pulses without delay and no carotid bruits Chest: clear to auscultation, no signs of consolidation by percussion or palpation, normal fremitus, symmetrical and full respiratory excursions Cardiovascular: normal position and quality of the apical impulse, irregular rhythm, normal first and second heart sounds, early-mid peaking aortic ejection murmur with distinct S2 still present, no diastolic murmurs, rubs or gallops Abdomen: no tenderness or distention, no masses by palpation, no abnormal pulsatility  or arterial bruits, normal bowel sounds, no hepatosplenomegaly Extremities: no clubbing, cyanosis or edema; 2+ radial, ulnar and brachial pulses bilaterally; 2+ right femoral, posterior tibial and dorsalis pedis pulses; 2+ left femoral, posterior tibial and dorsalis pedis pulses; no subclavian or femoral bruits Neurological: grossly nonfocal Psych: Normal mood and affect    AAA Duplex 07/20/2022:            Diameter AP (cm)Diameter Trans (cm)Velocities (cm/sec)  +----------+----------------+-------------------+-------------------+  Aorta    2.43            2.30               45        Abdominal Aorta: The largest aortic diameter has decreased compared to  prior exam. Previous diameter measurement was 2.64 x 2.67 cm obtained on  08/07/2021.  Endoleak not visualized.      ECHO 03/26/2021:     1. The aortic valve is calcified. Aortic valve regurgitation is trivial.  Aortic valve mean gradient measures 10.5 mmHg. Aortic valve acceleration  time measures 91 msec. DVI 0.29 with diminished LV stroke volume index.   2. Left ventricular ejection fraction, by estimation, is 60 to 65%. The  left ventricle has normal function. The left ventricle  has no regional  wall motion abnormalities. Left ventricular diastolic parameters are  indeterminate.   3. Right ventricular systolic function is normal. The right ventricular  size is normal. There is normal pulmonary artery systolic pressure.   4. The mitral valve is grossly normal. Trivial mitral valve  regurgitation. No evidence of mitral stenosis. Mild MAC.   5. The inferior vena cava is normal in size with greater than 50%  respiratory variability, suggesting right atrial pressure of 3 mmHg.   Comparison(s): A prior study was performed on 07/09/2019. Similar aortic  valve gradients with diminished LV stroke volume and index from prior.  Cannot exclude a component of paradoxial low flow low gradient aortic  stenosis.     CT Angio 01/24/2021 Stable infrarenal saccular aneurysm diameter following aortic stent graft repair at 3.2 cm.   Slight interval increased contrast enhancement of the anterior inferior aspect of the small aortic saccular aneurysm compatible with a persistent endoleak without clear source demonstrated. Type 2 endoleak is favored as detailed above.   Stable aneurysmal disease of the right common iliac artery, maximal diameter 2.5 cm.   NON-VASCULAR   No other acute intra-abdominal or pelvic finding by CTA.  EKG: ordered today, personally reviewed shows atrial fibrillation with controlled rate at 77 bpm.  Low voltage QRS in precordial leads only, QS pattern V1-V2 is unchanged, no ischemic repolarization changes, QTc 430 ms.     Recent Labs: 08/04/2022: ALT 18; BUN 9; Creatinine 0.97; Hemoglobin 10.9; Platelet Count 131; Potassium 4.3; Sodium 137  June 16, 2018 Dr. Drema [place]: Total cholesterol 142, HDL 48, LDL 76, triglycerides 90 Hemoglobin of 11.0, creatinine 0.96  June 23, 2019 Creatinine 0.95, potassium 4.3, hemoglobin 12.0  08/04/2022 Hemoglobin 10.9, creatinine 0.97, potassium 4.3, ALT 18  Lipid Panel    Component Value Date/Time   CHOL 142  07/18/2020 0930   TRIG 66 07/18/2020 0930   HDL 67 07/18/2020 0930   CHOLHDL 2.1 07/18/2020 0930   CHOLHDL 3.0 01/27/2008 0652   VLDL 10 01/27/2008 0652   LDLCALC 62 07/18/2020 0930   06/23/2022 Close 149, HDL 66, LDL 62, triglycerides 120  Wt Readings from Last 3 Encounters:  08/20/22 138 lb 12.8 oz (63 kg)  08/04/22 139 lb  9.6 oz (63.3 kg)  07/02/22 131 lb 9.6 oz (59.7 kg)     1. Permanent atrial fibrillation ([place])   2. Aortic valve stenosis, nonrheumatic   3. Coronary artery disease involving native coronary artery of native heart with other form of angina pectoris ([place])   4. Pure hypercholesterolemia   5. Essential hypertension   6. Status post endovascular aneurysm repair (EVAR)     ASSESSMENT AND PLAN:  1. Afib: Asymptomatic, permanent arrhythmia, well rate controlled.  CHA2DS2-VASc score is at least 3 (age, hypertension, CAD/PAD) .  Compliant with anticoagulation.  Antiarrhythmics do not appear to be indicated, nor is cardioversion. 2. CAD: He does not have angina pectoris.  Not on aspirin due to full anticoagulation.  On beta-blocker and statin.  Last evaluation was a low risk nuclear stress test in 2014, no revascularization since 2009.  Normal left ventricular ejection fraction by echo in 2020. 3. HLP: on statin with all lipid parameters in target range. 4. HTN: Well-controlled. 5. AS: Asymptomatic.  Reviewed warning symptoms of exertional angina/exertional dyspnea/exertional syncope.  Recheck echocardiogram next year. 6. AAA s/p EVAR: Endoleak has sealed and aortic diameter is lower.   Patient Instructions  Medication Instructions:  No changes   *If you need a refill on your cardiac medications before your next appointment, please call your pharmacy*   Lab Work: Not needed    Testing/Procedures:  Will be schedule in July 2024  at [place] has requested that you have an echocardiogram. Echocardiography is a painless  test that uses sound waves to create images of your heart. It provides your doctor with information about the size and shape of your heart and how well your heart's chambers and valves are working. This procedure takes approximately one hour. There are no restrictions for this procedure. Please do NOT wear cologne, perfume, aftershave, or lotions (deodorant is allowed). Please arrive 15 minutes prior to your appointment time.   Follow-Up: At [facility], you and your health needs are our priority.  As part of our continuing mission to provide you with exceptional heart care, we have created designated Provider Care Teams.  These Care Teams include your primary Cardiologist (physician) and Advanced Practice Providers (APPs -  Physician Assistants and Nurse Practitioners) who all work together to provide you with the care you need, when you need it.     Your next appointment:   12 month(s)  The format for your next appointment:   In Person  Provider:   Sanda Klein, MD       Sanda Klein, MD, [facility] [phone] office [phone] pager

## 2022-08-20 NOTE — Patient Instructions (Addendum)
Medication Instructions:  No changes   *If you need a refill on your cardiac medications before your next appointment, please call your pharmacy*   Lab Work: Not needed    Testing/Procedures:  Will be schedule in July 2024  at [place] has requested that you have an echocardiogram. Echocardiography is a painless test that uses sound waves to create images of your heart. It provides your doctor with information about the size and shape of your heart and how well your heart's chambers and valves are working. This procedure takes approximately one hour. There are no restrictions for this procedure. Please do NOT wear cologne, perfume, aftershave, or lotions (deodorant is allowed). Please arrive 15 minutes prior to your appointment time.   Follow-Up: At [facility], you and your health needs are our priority.  As part of our continuing mission to provide you with exceptional heart care, we have created designated Provider Care Teams.  These Care Teams include your primary Cardiologist (physician) and Advanced Practice Providers (APPs -  Physician Assistants and Nurse Practitioners) who all work together to provide you with the care you need, when you need it.     Your next appointment:   12 month(s)  The format for your next appointment:   In Person  Provider:   Sanda Klein, MD

## 2022-08-26 ENCOUNTER — Encounter: Payer: Self-pay | Admitting: Cardiovascular Disease

## 2022-09-23 ENCOUNTER — Other Ambulatory Visit (HOSPITAL_COMMUNITY): Payer: Self-pay

## 2022-09-23 ENCOUNTER — Encounter: Payer: Self-pay | Admitting: Hematology and Oncology

## 2022-09-24 ENCOUNTER — Other Ambulatory Visit (HOSPITAL_COMMUNITY): Payer: Self-pay

## 2022-09-25 ENCOUNTER — Other Ambulatory Visit (HOSPITAL_COMMUNITY): Payer: Self-pay

## 2022-09-25 MED ORDER — ATORVASTATIN CALCIUM 20 MG PO TABS
20.0000 mg | ORAL_TABLET | Freq: Every day | ORAL | 1 refills | Status: DC
  Filled 2022-11-05: qty 90, 90d supply, fill #0

## 2022-09-25 MED ORDER — HYDROCORTISONE 2.5 % EX CREA
1.0000 | TOPICAL_CREAM | Freq: Two times a day (BID) | CUTANEOUS | 1 refills | Status: DC
  Filled 2022-11-05: qty 60, 30d supply, fill #0

## 2022-09-25 MED ORDER — PANTOPRAZOLE SODIUM 40 MG PO TBEC
40.0000 mg | DELAYED_RELEASE_TABLET | Freq: Every day | ORAL | 1 refills | Status: DC
  Filled 2022-11-05: qty 90, 90d supply, fill #0

## 2022-09-25 MED ORDER — APIXABAN 5 MG PO TABS
5.0000 mg | ORAL_TABLET | Freq: Two times a day (BID) | ORAL | 1 refills | Status: DC
  Filled 2022-11-05: qty 180, 90d supply, fill #0

## 2022-09-25 MED ORDER — BISOPROLOL FUMARATE 10 MG PO TABS
15.0000 mg | ORAL_TABLET | Freq: Every day | ORAL | 1 refills | Status: DC
  Filled 2022-11-05: qty 135, 90d supply, fill #0

## 2022-09-25 MED ORDER — TAMSULOSIN HCL 0.4 MG PO CAPS
0.4000 mg | ORAL_CAPSULE | Freq: Every day | ORAL | 1 refills | Status: DC
  Filled 2022-11-05: qty 90, 90d supply, fill #0

## 2022-09-28 ENCOUNTER — Other Ambulatory Visit (HOSPITAL_COMMUNITY): Payer: Self-pay

## 2022-10-05 DIAGNOSIS — R809 Proteinuria, unspecified: Secondary | ICD-10-CM | POA: Diagnosis not present

## 2022-10-14 DIAGNOSIS — I4891 Unspecified atrial fibrillation: Secondary | ICD-10-CM | POA: Diagnosis not present

## 2022-10-14 DIAGNOSIS — I10 Essential (primary) hypertension: Secondary | ICD-10-CM | POA: Diagnosis not present

## 2022-10-14 DIAGNOSIS — R809 Proteinuria, unspecified: Secondary | ICD-10-CM | POA: Diagnosis not present

## 2022-10-14 DIAGNOSIS — I5032 Chronic diastolic (congestive) heart failure: Secondary | ICD-10-CM | POA: Diagnosis not present

## 2022-11-05 ENCOUNTER — Other Ambulatory Visit (HOSPITAL_COMMUNITY): Payer: Self-pay

## 2022-11-05 ENCOUNTER — Other Ambulatory Visit: Payer: Self-pay

## 2022-11-10 ENCOUNTER — Other Ambulatory Visit (HOSPITAL_COMMUNITY): Payer: Self-pay

## 2022-11-11 ENCOUNTER — Other Ambulatory Visit (HOSPITAL_COMMUNITY): Payer: Self-pay

## 2022-11-11 ENCOUNTER — Encounter (HOSPITAL_COMMUNITY): Payer: Self-pay

## 2022-11-11 MED ORDER — AMOXICILLIN 500 MG PO CAPS
2000.0000 mg | ORAL_CAPSULE | ORAL | 0 refills | Status: DC
  Filled 2022-11-11: qty 20, 5d supply, fill #0

## 2022-11-11 MED ORDER — FLUOCINOLONE ACETONIDE SCALP 0.01 % EX OIL
TOPICAL_OIL | CUTANEOUS | 3 refills | Status: AC
  Filled 2022-11-11: qty 118.28, 30d supply, fill #0
  Filled 2023-01-27: qty 118.28, 30d supply, fill #1
  Filled 2023-06-19: qty 118.28, 30d supply, fill #2

## 2022-11-11 MED ORDER — BETAMETHASONE DIPROPIONATE AUG 0.05 % EX CREA
TOPICAL_CREAM | CUTANEOUS | 3 refills | Status: AC
  Filled 2022-11-11: qty 50, 30d supply, fill #0
  Filled 2023-01-27: qty 50, 30d supply, fill #1
  Filled 2023-08-13: qty 50, 30d supply, fill #2

## 2022-11-12 ENCOUNTER — Other Ambulatory Visit (HOSPITAL_COMMUNITY): Payer: Self-pay

## 2022-11-13 ENCOUNTER — Other Ambulatory Visit (HOSPITAL_COMMUNITY): Payer: Self-pay

## 2022-11-17 DIAGNOSIS — X32XXXD Exposure to sunlight, subsequent encounter: Secondary | ICD-10-CM | POA: Diagnosis not present

## 2022-11-17 DIAGNOSIS — C44329 Squamous cell carcinoma of skin of other parts of face: Secondary | ICD-10-CM | POA: Diagnosis not present

## 2022-11-17 DIAGNOSIS — L570 Actinic keratosis: Secondary | ICD-10-CM | POA: Diagnosis not present

## 2022-11-18 ENCOUNTER — Other Ambulatory Visit (HOSPITAL_COMMUNITY): Payer: Self-pay

## 2022-12-03 ENCOUNTER — Telehealth: Payer: Self-pay

## 2022-12-03 ENCOUNTER — Encounter: Payer: Self-pay | Admitting: Hematology and Oncology

## 2022-12-03 ENCOUNTER — Other Ambulatory Visit: Payer: Self-pay

## 2022-12-03 ENCOUNTER — Inpatient Hospital Stay (HOSPITAL_BASED_OUTPATIENT_CLINIC_OR_DEPARTMENT_OTHER): Payer: PPO | Admitting: Hematology and Oncology

## 2022-12-03 ENCOUNTER — Inpatient Hospital Stay: Payer: PPO

## 2022-12-03 ENCOUNTER — Inpatient Hospital Stay: Payer: PPO | Attending: Hematology and Oncology

## 2022-12-03 DIAGNOSIS — Z79899 Other long term (current) drug therapy: Secondary | ICD-10-CM | POA: Insufficient documentation

## 2022-12-03 DIAGNOSIS — D539 Nutritional anemia, unspecified: Secondary | ICD-10-CM | POA: Diagnosis not present

## 2022-12-03 DIAGNOSIS — D61818 Other pancytopenia: Secondary | ICD-10-CM

## 2022-12-03 LAB — CBC WITH DIFFERENTIAL/PLATELET
Abs Immature Granulocytes: 0.03 10*3/uL (ref 0.00–0.07)
Basophils Absolute: 0 10*3/uL (ref 0.0–0.1)
Basophils Relative: 1 %
Eosinophils Absolute: 0.1 10*3/uL (ref 0.0–0.5)
Eosinophils Relative: 2 %
HCT: 33.6 % — ABNORMAL LOW (ref 39.0–52.0)
Hemoglobin: 11.4 g/dL — ABNORMAL LOW (ref 13.0–17.0)
Immature Granulocytes: 1 %
Lymphocytes Relative: 24 %
Lymphs Abs: 1 10*3/uL (ref 0.7–4.0)
MCH: 36.1 pg — ABNORMAL HIGH (ref 26.0–34.0)
MCHC: 33.9 g/dL (ref 30.0–36.0)
MCV: 106.3 fL — ABNORMAL HIGH (ref 80.0–100.0)
Monocytes Absolute: 0.7 10*3/uL (ref 0.1–1.0)
Monocytes Relative: 16 %
Neutro Abs: 2.4 10*3/uL (ref 1.7–7.7)
Neutrophils Relative %: 56 %
Platelets: 127 10*3/uL — ABNORMAL LOW (ref 150–400)
RBC: 3.16 MIL/uL — ABNORMAL LOW (ref 4.22–5.81)
RDW: 13.2 % (ref 11.5–15.5)
WBC: 4.2 10*3/uL (ref 4.0–10.5)
nRBC: 0 % (ref 0.0–0.2)

## 2022-12-03 LAB — FERRITIN: Ferritin: 840 ng/mL — ABNORMAL HIGH (ref 24–336)

## 2022-12-03 NOTE — Assessment & Plan Note (Signed)
He presented today for phlebotomy Unfortunately, due to persistent pancytopenia, his treatment is canceled again I would like to see hemoglobin at least over 14 before we proceed with phlebotomy We discussed importance of the patient refraining from alcohol use as alcohol can also cause worsening risk of iron overload and bone marrow suppression causing pancytopenia He has follow-up with primary care He will call me once he is able to quit drinking to resume plan for phlebotomy

## 2022-12-03 NOTE — Assessment & Plan Note (Signed)
He has persistent macrocytic anemia I have ordered vitamin B12 level, result is normal Observe only 

## 2022-12-03 NOTE — Telephone Encounter (Signed)
Returned his call. Requesting a copy of med list and allergy list. Verified mailing address and will mail to him per his request.

## 2022-12-03 NOTE — Progress Notes (Signed)
[facility] OFFICE PROGRESS NOTE  Corey Del, DO  ASSESSMENT & PLAN:  Hemochromatosis He presented today for phlebotomy Unfortunately, due to persistent pancytopenia, his treatment is canceled again I would like to see hemoglobin at least over 14 before we proceed with phlebotomy We discussed importance of the patient refraining from alcohol use as alcohol can also cause worsening risk of iron overload and bone marrow suppression causing pancytopenia He has follow-up with primary care He will call me once he is able to quit drinking to resume plan for phlebotomy  Pancytopenia, acquired ([place]) This is multifactorial, likely due to recent alcoholism He is not symptomatic  Macrocytic anemia He has persistent macrocytic anemia I have ordered vitamin B12 level, result is normal Observe only  No orders of the defined types were placed in this encounter.   The total time spent in the appointment was 20 minutes encounter with patients including review of chart and various tests results, discussions about plan of care and coordination of care plan   All questions were answered. The patient knows to call the clinic with any problems, questions or concerns. No barriers to learning was detected.    Corey Lark, MD 4/4/20244:27 PM  INTERVAL HISTORY: Corey Garcia 77 y.o. male returns for phlebotomy He feels well Denies recent infection The patient denies any recent signs or symptoms of bleeding such as spontaneous epistaxis, hematuria or hematochezia. Unfortunately he is still drinking alcohol (beers) daily  SUMMARY OF HEMATOLOGIC HISTORY: The abnormal blood work was discovered during a routine visit with his primary care provider. I have the opportunity to review his blood work dated back for a while. His white blood cell count ranged from 3.1-5.6, hemoglobin from 11.3-12.6, MCV from 103-104.5, and ferritin as high as 910, along with abnormal liver function  tests. He complains of fatigue, chronic headaches and chronic joint pain. The patient has history of basal cell carcinoma removed 4 times. He had been on topical steroid cream for eczema and complained of diffuse skin rash on his sun exposed area and face. He also has a significant easy bruising. The patient has been on antiplatelet agents along time for coronary artery disease. The patient denies any recent signs or symptoms of bleeding such as spontaneous epistaxis, hematuria or hematochezia. 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  It was performed to evaluate for macrocytic anemia with mild pancytopenia In May 2019, he had CT imaging which show early signs of liver cirrhosis.  I have reviewed the past medical history, past surgical history, social history and family history with the patient and they are unchanged from previous note.  ALLERGIES:  is allergic to lisinopril, minocin [minocycline hcl], sulfa antibiotics, thiazide-type diuretics, and codeine.  MEDICATIONS:  Current Outpatient Medications  Medication Sig Dispense Refill   amoxicillin (AMOXIL) 500 MG capsule Take 4 capsules (2,000 mg total) by mouth 1 hour prior to dental work. 20 capsule 0   apixaban (ELIQUIS) 5 MG TABS tablet Take 1 tablet (5 mg total) by mouth 2 (two) times daily. 180 tablet 1   atorvastatin (LIPITOR) 20 MG tablet Take 1 tablet (20 mg total) by mouth daily. 90 tablet 1   augmented betamethasone dipropionate (DIPROLENE-AF) 0.05 % cream Apply to affected area on back up to 2 times a day as needed (not to face, groin or  underarms) 50 g 3   bisacodyl (DULCOLAX) 5 MG EC tablet Take 5 mg by mouth daily as needed for moderate constipation.     bisoprolol (ZEBETA) 10 MG tablet Take 1.5 tablets (15 mg total) by mouth daily. 135  tablet 1   cholecalciferol (VITAMIN D) 1000 units tablet Take 1,000 Units by mouth daily.     Fluocinolone Acetonide Scalp 0.01 % OIL Apply to the affected area on scalp every night at bedtime as needed (not to face, groin or underarms) 118.28 mL 3   hydrocortisone (ANUSOL-HC) 2.5 % rectal cream Place 1 application rectally as needed for hemorrhoids or itching.     hydrocortisone 2.5 % cream Apply 1 Application to affected areas topically 2 (two) times daily. 60 g 1   loratadine (CLARITIN) 10 MG tablet Take 10 mg by mouth daily.      pantoprazole (PROTONIX) 40 MG tablet Take 1 tablet (40 mg total) by mouth daily. 90 tablet 1   Probiotic Product (PROBIOTIC DAILY PO) Take 1 tablet by mouth daily in the afternoon.     tamsulosin (FLOMAX) 0.4 MG CAPS capsule Take 1 capsule (0.4 mg total) by mouth daily. 90 capsule 1   No current facility-administered medications for this visit.     REVIEW OF SYSTEMS:   Constitutional: Denies fevers, chills or night sweats Eyes: Denies blurriness of vision Ears, nose, mouth, throat, and face: Denies mucositis or sore throat Respiratory: Denies cough, dyspnea or wheezes Cardiovascular: Denies palpitation, chest discomfort or lower extremity swelling Gastrointestinal:  Denies nausea, heartburn or change in bowel habits Skin: Denies abnormal skin rashes Lymphatics: Denies new lymphadenopathy or easy bruising Neurological:Denies numbness, tingling or new weaknesses Behavioral/Psych: Mood is stable, no new changes  All other systems were reviewed with the patient and are negative.  PHYSICAL EXAMINATION: ECOG PERFORMANCE STATUS: 0 - Asymptomatic  Vitals:   12/03/22 1139  BP: (!) 147/78  Pulse: 67  Resp: 18  Temp: (!) 97.4 F (36.3 C)  SpO2: 100%   Filed Weights   12/03/22 1139  Weight: 139 lb 3.2 oz (63.1 kg)    GENERAL:alert, no distress and comfortable  NEURO: alert & oriented x 3 with fluent speech, no focal motor/sensory deficits  LABORATORY  DATA:  I have reviewed the data as listed     Component Value Date/Time   NA 137 08/04/2022 1114   NA 133 (L) 07/18/2020 0930   NA 138 06/20/2015 1303   K 4.3 08/04/2022 1114   K 4.6 06/20/2015 1303   CL 103 08/04/2022 1114   CO2 29 08/04/2022 1114   CO2 28 06/20/2015 1303   GLUCOSE 82 08/04/2022 1114   GLUCOSE 90 06/20/2015 1303   BUN 9 08/04/2022 1114   BUN 13 07/18/2020 0930   BUN 8.3 06/20/2015 1303   CREATININE 0.97 08/04/2022 1114   CREATININE 0.9 06/20/2015 1303   CALCIUM 9.4 08/04/2022 1114   CALCIUM 9.1 06/20/2015 1303   PROT 7.0 08/04/2022 1114   PROT 7.1 07/18/2020 0930   PROT 6.9 06/20/2015 1303   ALBUMIN 4.0 08/04/2022 1114   ALBUMIN 4.3 07/18/2020 0930   ALBUMIN 3.3 (L) 06/20/2015 1303   AST 31 08/04/2022 1114   AST 88 (H) 06/20/2015 1303   ALT 18 08/04/2022 1114   ALT 82 (H) 06/20/2015 1303   ALKPHOS 56 08/04/2022 1114   ALKPHOS 59 06/20/2015 1303   BILITOT 0.4 08/04/2022 1114   BILITOT 0.45 06/20/2015 1303   GFRNONAA >60 08/04/2022 1114   GFRAA 91 07/18/2020  0930    No results found for: "SPEP", "UPEP"  Lab Results  Component Value Date   WBC 4.2 12/03/2022   NEUTROABS 2.4 12/03/2022   HGB 11.4 (L) 12/03/2022   HCT 33.6 (L) 12/03/2022   MCV 106.3 (H) 12/03/2022   PLT 127 (L) 12/03/2022      Chemistry      Component Value Date/Time   NA 137 08/04/2022 1114   NA 133 (L) 07/18/2020 0930   NA 138 06/20/2015 1303   K 4.3 08/04/2022 1114   K 4.6 06/20/2015 1303   CL 103 08/04/2022 1114   CO2 29 08/04/2022 1114   CO2 28 06/20/2015 1303   BUN 9 08/04/2022 1114   BUN 13 07/18/2020 0930   BUN 8.3 06/20/2015 1303   CREATININE 0.97 08/04/2022 1114   CREATININE 0.9 06/20/2015 1303      Component Value Date/Time   CALCIUM 9.4 08/04/2022 1114   CALCIUM 9.1 06/20/2015 1303   ALKPHOS 56 08/04/2022 1114   ALKPHOS 59 06/20/2015 1303   AST 31 08/04/2022 1114   AST 88 (H) 06/20/2015 1303   ALT 18 08/04/2022 1114   ALT 82 (H) 06/20/2015 1303    BILITOT 0.4 08/04/2022 1114   BILITOT 0.45 06/20/2015 1303

## 2022-12-03 NOTE — Assessment & Plan Note (Signed)
This is multifactorial, likely due to recent alcoholism He is not symptomatic

## 2022-12-21 ENCOUNTER — Other Ambulatory Visit (HOSPITAL_COMMUNITY): Payer: Self-pay

## 2022-12-24 DIAGNOSIS — I2510 Atherosclerotic heart disease of native coronary artery without angina pectoris: Secondary | ICD-10-CM | POA: Diagnosis not present

## 2022-12-24 DIAGNOSIS — I4891 Unspecified atrial fibrillation: Secondary | ICD-10-CM | POA: Diagnosis not present

## 2022-12-24 DIAGNOSIS — I4821 Permanent atrial fibrillation: Secondary | ICD-10-CM | POA: Diagnosis not present

## 2022-12-24 DIAGNOSIS — D6869 Other thrombophilia: Secondary | ICD-10-CM | POA: Diagnosis not present

## 2022-12-24 DIAGNOSIS — D692 Other nonthrombocytopenic purpura: Secondary | ICD-10-CM | POA: Diagnosis not present

## 2022-12-29 DIAGNOSIS — Z08 Encounter for follow-up examination after completed treatment for malignant neoplasm: Secondary | ICD-10-CM | POA: Diagnosis not present

## 2022-12-29 DIAGNOSIS — Z85828 Personal history of other malignant neoplasm of skin: Secondary | ICD-10-CM | POA: Diagnosis not present

## 2022-12-29 DIAGNOSIS — L570 Actinic keratosis: Secondary | ICD-10-CM | POA: Diagnosis not present

## 2022-12-29 DIAGNOSIS — B078 Other viral warts: Secondary | ICD-10-CM | POA: Diagnosis not present

## 2022-12-29 DIAGNOSIS — X32XXXD Exposure to sunlight, subsequent encounter: Secondary | ICD-10-CM | POA: Diagnosis not present

## 2023-01-27 ENCOUNTER — Other Ambulatory Visit: Payer: Self-pay

## 2023-01-27 ENCOUNTER — Other Ambulatory Visit (HOSPITAL_COMMUNITY): Payer: Self-pay | Admitting: Family Medicine

## 2023-01-27 ENCOUNTER — Telehealth: Payer: Self-pay | Admitting: Cardiovascular Disease

## 2023-01-27 ENCOUNTER — Other Ambulatory Visit: Payer: Self-pay | Admitting: Cardiovascular Disease

## 2023-01-27 ENCOUNTER — Other Ambulatory Visit (HOSPITAL_BASED_OUTPATIENT_CLINIC_OR_DEPARTMENT_OTHER): Payer: Self-pay

## 2023-01-27 ENCOUNTER — Other Ambulatory Visit (HOSPITAL_COMMUNITY): Payer: Self-pay

## 2023-01-27 DIAGNOSIS — I4821 Permanent atrial fibrillation: Secondary | ICD-10-CM

## 2023-01-27 MED ORDER — APIXABAN 5 MG PO TABS
5.0000 mg | ORAL_TABLET | Freq: Two times a day (BID) | ORAL | 1 refills | Status: DC
  Filled 2023-01-27: qty 200, 100d supply, fill #0
  Filled 2023-05-10: qty 200, 100d supply, fill #1

## 2023-01-27 MED ORDER — APIXABAN 5 MG PO TABS
5.0000 mg | ORAL_TABLET | Freq: Two times a day (BID) | ORAL | 1 refills | Status: DC
  Filled 2023-01-27: qty 180, 90d supply, fill #0

## 2023-01-27 NOTE — Telephone Encounter (Signed)
Prescription refill request for Eliquis received. Indication:afib Last office visit:12/23 Scr:0.97  12/23 Age: 77 Weight:63.1  kg  Prescription refilled

## 2023-01-27 NOTE — Telephone Encounter (Signed)
*  STAT* If patient is at the pharmacy, call can be transferred to refill team.   1. Which medications need to be refilled? (please list name of each medication and dose if known) apixaban (ELIQUIS) 5 MG TABS tablet   2. Which pharmacy/location (including street and city if local pharmacy) is medication to be sent to?   [facility]    3. Do they need a 30 day or 90 day supply? Pt states his insurance will only pay for 200 tablets (100 day supply)

## 2023-01-29 ENCOUNTER — Other Ambulatory Visit (HOSPITAL_COMMUNITY): Payer: Self-pay

## 2023-02-13 ENCOUNTER — Other Ambulatory Visit (HOSPITAL_COMMUNITY): Payer: Self-pay

## 2023-02-13 MED ORDER — HYDROCORTISONE 2.5 % EX CREA
1.0000 | TOPICAL_CREAM | Freq: Two times a day (BID) | CUTANEOUS | 1 refills | Status: DC
  Filled 2023-02-13: qty 60, 30d supply, fill #0
  Filled 2023-03-29: qty 60, 30d supply, fill #1

## 2023-03-09 ENCOUNTER — Ambulatory Visit (HOSPITAL_COMMUNITY): Payer: PPO | Attending: Cardiovascular Disease

## 2023-03-09 ENCOUNTER — Encounter: Payer: Self-pay | Admitting: Hematology and Oncology

## 2023-03-09 DIAGNOSIS — I4821 Permanent atrial fibrillation: Secondary | ICD-10-CM | POA: Insufficient documentation

## 2023-03-09 DIAGNOSIS — I350 Nonrheumatic aortic (valve) stenosis: Secondary | ICD-10-CM | POA: Diagnosis not present

## 2023-03-09 LAB — ECHOCARDIOGRAM COMPLETE
AR max vel: 0.68 cm2
AV Area VTI: 0.68 cm2
AV Area mean vel: 0.68 cm2
AV Mean grad: 19 mmHg
AV Peak grad: 32 mmHg
Ao pk vel: 2.83 m/s
Area-P 1/2: 3.75 cm2
MV VTI: 1.56 cm2
P 1/2 time: 353 msec
S' Lateral: 2.7 cm

## 2023-03-22 DIAGNOSIS — H01026 Squamous blepharitis left eye, unspecified eyelid: Secondary | ICD-10-CM | POA: Diagnosis not present

## 2023-03-22 DIAGNOSIS — H11042 Peripheral pterygium, stationary, left eye: Secondary | ICD-10-CM | POA: Diagnosis not present

## 2023-03-22 DIAGNOSIS — H04123 Dry eye syndrome of bilateral lacrimal glands: Secondary | ICD-10-CM | POA: Diagnosis not present

## 2023-03-22 DIAGNOSIS — H01023 Squamous blepharitis right eye, unspecified eyelid: Secondary | ICD-10-CM | POA: Diagnosis not present

## 2023-03-27 ENCOUNTER — Other Ambulatory Visit (HOSPITAL_COMMUNITY): Payer: Self-pay

## 2023-03-27 ENCOUNTER — Other Ambulatory Visit (HOSPITAL_COMMUNITY): Payer: Self-pay | Admitting: Family Medicine

## 2023-03-29 ENCOUNTER — Other Ambulatory Visit (HOSPITAL_COMMUNITY): Payer: Self-pay | Admitting: Family Medicine

## 2023-03-29 ENCOUNTER — Other Ambulatory Visit (HOSPITAL_COMMUNITY): Payer: Self-pay

## 2023-03-30 ENCOUNTER — Other Ambulatory Visit: Payer: Self-pay

## 2023-04-05 ENCOUNTER — Other Ambulatory Visit (HOSPITAL_COMMUNITY): Payer: Self-pay

## 2023-04-05 ENCOUNTER — Other Ambulatory Visit: Payer: Self-pay

## 2023-04-05 ENCOUNTER — Other Ambulatory Visit: Payer: Self-pay | Admitting: Cardiovascular Disease

## 2023-04-05 ENCOUNTER — Encounter: Payer: Self-pay | Admitting: Hematology and Oncology

## 2023-04-05 MED ORDER — PANTOPRAZOLE SODIUM 40 MG PO TBEC
40.0000 mg | DELAYED_RELEASE_TABLET | Freq: Every day | ORAL | 1 refills | Status: DC
  Filled 2023-04-05: qty 90, 90d supply, fill #0
  Filled 2023-06-16 (×2): qty 90, 90d supply, fill #1

## 2023-04-05 MED ORDER — TAMSULOSIN HCL 0.4 MG PO CAPS
0.4000 mg | ORAL_CAPSULE | Freq: Every day | ORAL | 1 refills | Status: DC
  Filled 2023-04-05: qty 90, 90d supply, fill #0
  Filled 2023-06-16 (×2): qty 90, 90d supply, fill #1

## 2023-04-05 MED ORDER — BISOPROLOL FUMARATE 10 MG PO TABS: 15.0000 mg | ORAL_TABLET | Freq: Every day | ORAL | 1 refills | Status: DC

## 2023-04-05 MED ORDER — ATORVASTATIN CALCIUM 20 MG PO TABS
20.0000 mg | ORAL_TABLET | Freq: Every day | ORAL | 1 refills | Status: DC
  Filled 2023-04-05: qty 90, 90d supply, fill #0

## 2023-04-05 MED ORDER — BISOPROLOL FUMARATE 10 MG PO TABS
15.0000 mg | ORAL_TABLET | Freq: Every day | ORAL | 1 refills | Status: DC
  Filled 2023-04-05: qty 135, 90d supply, fill #0
  Filled 2023-06-16 (×2): qty 135, 90d supply, fill #1

## 2023-04-05 MED ORDER — ATORVASTATIN CALCIUM 20 MG PO TABS
20.0000 mg | ORAL_TABLET | Freq: Every day | ORAL | 1 refills | Status: DC
  Filled 2023-04-05: qty 90, 90d supply, fill #0
  Filled 2023-06-16 (×2): qty 90, 90d supply, fill #1

## 2023-04-05 MED ORDER — VITAMIN D3 25 MCG (1000 UNIT) PO TABS
25.0000 ug | ORAL_TABLET | Freq: Every day | ORAL | 3 refills | Status: DC
  Filled 2023-04-05: qty 100, 100d supply, fill #0
  Filled 2023-06-16 (×2): qty 100, 100d supply, fill #1
  Filled 2023-09-02 (×2): qty 100, 100d supply, fill #2
  Filled 2023-12-22 (×2): qty 100, 100d supply, fill #3

## 2023-04-06 ENCOUNTER — Other Ambulatory Visit: Payer: Self-pay

## 2023-04-07 ENCOUNTER — Other Ambulatory Visit (HOSPITAL_COMMUNITY): Payer: Self-pay

## 2023-04-07 ENCOUNTER — Other Ambulatory Visit: Payer: Self-pay

## 2023-04-23 DIAGNOSIS — Z6823 Body mass index (BMI) 23.0-23.9, adult: Secondary | ICD-10-CM | POA: Diagnosis not present

## 2023-04-23 DIAGNOSIS — M545 Low back pain, unspecified: Secondary | ICD-10-CM | POA: Diagnosis not present

## 2023-04-23 DIAGNOSIS — G5601 Carpal tunnel syndrome, right upper limb: Secondary | ICD-10-CM | POA: Diagnosis not present

## 2023-04-23 DIAGNOSIS — I4891 Unspecified atrial fibrillation: Secondary | ICD-10-CM | POA: Diagnosis not present

## 2023-04-23 DIAGNOSIS — I10 Essential (primary) hypertension: Secondary | ICD-10-CM | POA: Diagnosis not present

## 2023-04-23 DIAGNOSIS — I2510 Atherosclerotic heart disease of native coronary artery without angina pectoris: Secondary | ICD-10-CM | POA: Diagnosis not present

## 2023-05-06 DIAGNOSIS — M5116 Intervertebral disc disorders with radiculopathy, lumbar region: Secondary | ICD-10-CM | POA: Diagnosis not present

## 2023-05-10 ENCOUNTER — Other Ambulatory Visit (HOSPITAL_COMMUNITY): Payer: Self-pay

## 2023-05-18 DIAGNOSIS — M5116 Intervertebral disc disorders with radiculopathy, lumbar region: Secondary | ICD-10-CM | POA: Diagnosis not present

## 2023-06-09 ENCOUNTER — Other Ambulatory Visit: Payer: Self-pay

## 2023-06-09 ENCOUNTER — Encounter (HOSPITAL_COMMUNITY): Payer: Self-pay

## 2023-06-09 ENCOUNTER — Emergency Department (HOSPITAL_COMMUNITY)
Admission: EM | Admit: 2023-06-09 | Discharge: 2023-06-09 | Disposition: A | Discharge status: Home / Self Care | Payer: PPO | Attending: Emergency Medicine | Admitting: Emergency Medicine

## 2023-06-09 DIAGNOSIS — S81811A Laceration without foreign body, right lower leg, initial encounter: Secondary | ICD-10-CM | POA: Diagnosis not present

## 2023-06-09 DIAGNOSIS — W293XXA Contact with powered garden and outdoor hand tools and machinery, initial encounter: Secondary | ICD-10-CM | POA: Diagnosis not present

## 2023-06-09 DIAGNOSIS — Z7901 Long term (current) use of anticoagulants: Secondary | ICD-10-CM | POA: Diagnosis not present

## 2023-06-09 DIAGNOSIS — S8991XA Unspecified injury of right lower leg, initial encounter: Secondary | ICD-10-CM | POA: Diagnosis present

## 2023-06-09 DIAGNOSIS — I10 Essential (primary) hypertension: Secondary | ICD-10-CM | POA: Diagnosis not present

## 2023-06-09 DIAGNOSIS — Z6823 Body mass index (BMI) 23.0-23.9, adult: Secondary | ICD-10-CM | POA: Diagnosis not present

## 2023-06-09 LAB — CBC WITH DIFFERENTIAL/PLATELET
Abs Immature Granulocytes: 0.04 10*3/uL (ref 0.00–0.07)
Basophils Absolute: 0 10*3/uL (ref 0.0–0.1)
Basophils Relative: 1 %
Eosinophils Absolute: 0.1 10*3/uL (ref 0.0–0.5)
Eosinophils Relative: 2 %
HCT: 31.3 % — ABNORMAL LOW (ref 39.0–52.0)
Hemoglobin: 10.4 g/dL — ABNORMAL LOW (ref 13.0–17.0)
Immature Granulocytes: 1 %
Lymphocytes Relative: 22 %
Lymphs Abs: 1.1 10*3/uL (ref 0.7–4.0)
MCH: 37.1 pg — ABNORMAL HIGH (ref 26.0–34.0)
MCHC: 33.2 g/dL (ref 30.0–36.0)
MCV: 111.8 fL — ABNORMAL HIGH (ref 80.0–100.0)
Monocytes Absolute: 0.8 10*3/uL (ref 0.1–1.0)
Monocytes Relative: 17 %
Neutro Abs: 2.8 10*3/uL (ref 1.7–7.7)
Neutrophils Relative %: 57 %
Platelets: 135 10*3/uL — ABNORMAL LOW (ref 150–400)
RBC: 2.8 MIL/uL — ABNORMAL LOW (ref 4.22–5.81)
RDW: 12.4 % (ref 11.5–15.5)
WBC: 4.8 10*3/uL (ref 4.0–10.5)
nRBC: 0 % (ref 0.0–0.2)

## 2023-06-09 MED ORDER — CEPHALEXIN 500 MG PO CAPS: 500.0000 mg | ORAL_CAPSULE | Freq: Four times a day (QID) | ORAL | 0 refills | Status: DC

## 2023-06-09 MED ORDER — BACITRACIN ZINC 500 UNIT/GM EX OINT
TOPICAL_OINTMENT | CUTANEOUS | Status: AC
  Administered 2023-06-09: 1 via TOPICAL
  Filled 2023-06-09: qty 0.9

## 2023-06-09 MED ORDER — LIDOCAINE-EPINEPHRINE 2 %-1:100000 IJ SOLN
20.0000 mL | Freq: Once | INTRAMUSCULAR | Status: AC
  Administered 2023-06-09: 20 mL
  Filled 2023-06-09: qty 1

## 2023-06-09 NOTE — Discharge Instructions (Addendum)
You were seen in the ER for evaluation of your leg laceration. I am glad we were able to repair this for you today. The sutures we placed were dissolvable and DO NOT need to be removed. Please make sure you are cleaning the wounds daily with Dial soap and water. It is still ok to shower, but do not soak the leg. No pools, lakes, Jacuzzis, oceans, baths, etc. Please make sure you to follow up with your PCP within the next few days for re-evaluation of the wound. I am starting you on an antibiotic for you to take to prevent infection. If you have any concerns, new or worsening symptoms, please return to the ER for re-evaluation.   Contact a doctor if: You got a tetanus shot and you have any of these problems where the needle went in: Swelling. Very bad pain. Redness. Bleeding. A wound that was closed breaks open. You have a fever. You have any of these signs of infection in your wound: More redness, swelling, or pain. Fluid or blood. Warmth. Pus or a bad smell. You see something coming out of the wound, such as wood or glass. Medicine does not make your pain go away. You notice a change in the color of your skin near your wound. You need to change the bandage often. You have a new rash. You lose feeling (have numbness) around the wound. Get help right away if: You have very bad swelling around the wound. Your pain suddenly gets worse and is very bad. You have painful lumps near the wound or on skin anywhere on your body. You have a red streak going away from your wound. The wound is on your hand or foot, and: You cannot move a finger or toe. Your fingers or toes look pale or bluish.

## 2023-06-09 NOTE — ED Triage Notes (Signed)
Pt was using hedge trimmers yesterday when he accidentally cut his ankle. EMS came out and performed wound care on it, pt went to his PCP today but was referred over to the ER. Pt states his last tetanus shot was a year ago. Pt does take Eliquis. Pt has wound wrapped up, unable to visualize wound in triage.

## 2023-06-09 NOTE — ED Provider Notes (Signed)
[facility] EMERGENCY DEPARTMENT AT [facility] Provider Note   CSN: [id number] Arrival date & time: 06/09/23  4098     History Chief Complaint  Patient presents with   Laceration    Corey Garcia is a 77 y.o. male on Eliquis presents to the ER for evaluation of right lower leg laceration yesterday around 1800. The patient reports that he was using a hack saw while he was on his knees. He used the hack saw as a crutch to help himself stand up, the sax bowed and cut him on his right lower leg. He was seen by EMS who dressed it. The patient presents with his grandson who reports that the wound was bleeding past the bandages, so they applied a long sock over. When it bled through the sock they added a dish towel, and then ended up tying a garbage bag over it. He was seen at his PCP and sent over to the ER. The patient reports that his tetanus shot was updated last year. He denies any change in sensation to the leg or pain with ambulation.    Laceration Associated symptoms: no fever        Home Medications Prior to Admission medications   Medication Sig Start Date End Date Taking? Authorizing Provider  amoxicillin (AMOXIL) 500 MG capsule Take 4 capsules (2,000 mg total) by mouth 1 hour prior to dental work. 11/11/22     apixaban (ELIQUIS) 5 MG TABS tablet Take 1 tablet (5 mg total) by mouth 2 (two) times daily. 01/27/23   Croitoru, Mihai, MD  atorvastatin (LIPITOR) 20 MG tablet Take 1 tablet (20 mg total) by mouth daily. 04/05/23   Croitoru, Mihai, MD  atorvastatin (LIPITOR) 20 MG tablet Take 1 tablet by mouth once daily. 04/05/23     augmented betamethasone dipropionate (DIPROLENE-AF) 0.05 % cream Apply to affected area on back up to 2 times a day as needed (not to face, groin or underarms) 09/02/22   Nita Sells, MD  bisacodyl (DULCOLAX) 5 MG EC tablet Take 5 mg by mouth daily as needed for moderate constipation.    [provider]  bisoprolol (ZEBETA) 10 MG tablet Take  1.5 tablets (15 mg total) by mouth daily. 04/05/23   Croitoru, Mihai, MD  bisoprolol (ZEBETA) 10 MG tablet Take 1.5 tablets (15mg ) by mouth once daily. 04/05/23     cholecalciferol (VITAMIN D) 1000 units tablet Take 1,000 Units by mouth daily.    [provider]  cholecalciferol (VITAMIN D3) 25 MCG (1000 UNIT) tablet Take one tablet by mouth once daily. 04/05/23     Fluocinolone Acetonide Scalp 0.01 % OIL Apply to the affected area on scalp every night at bedtime as needed (not to face, groin or underarms) 09/02/22   Nita Sells, MD  hydrocortisone (ANUSOL-HC) 2.5 % rectal cream Place 1 application rectally as needed for hemorrhoids or itching.    [provider]  hydrocortisone 2.5 % cream Apply 1 Application to affected areas topically 2 (two) times daily. 09/03/22   Jackelyn Poling, DO  hydrocortisone 2.5 % cream Apply  topically to affected areas 2 (two) times daily. 02/13/23     loratadine (CLARITIN) 10 MG tablet Take 10 mg by mouth daily.     [provider]  pantoprazole (PROTONIX) 40 MG tablet Take one tablet by mouth once daily. 04/05/23     Probiotic Product (PROBIOTIC DAILY PO) Take 1 tablet by mouth daily in the afternoon.    [provider]  tamsulosin (FLOMAX) 0.4 MG CAPS capsule Take one capsule by mouth once daily. 04/05/23         Allergies    Lisinopril, Minocin [minocycline hcl], Sulfa antibiotics, Thiazide-type diuretics, and Codeine    Review of Systems   Review of Systems  Constitutional:  Negative for chills and fever.  Skin:  Positive for wound.    Physical Exam Updated Vital Signs BP (!) 175/92 (BP Location: Right Arm)   Pulse 89   Temp 98.7 F (37.1 C) (Oral)   Resp 16   Ht 5\' 4"  (1.626 m)   Wt 61.2 kg   SpO2 98%   BMI 23.17 kg/m  Physical Exam Vitals and nursing note reviewed.  Constitutional:      General: He is not in acute distress.    Appearance: He is not ill-appearing or toxic-appearing.  Eyes:     General: No scleral  icterus. Pulmonary:     Effort: Pulmonary effort is normal. No respiratory distress.  Musculoskeletal:     Right lower leg: No edema.     Left lower leg: No edema.     Comments: 4 cm laceration present to the anterior/lateral aspect of the lower leg, towards the middle. Some subcutaneous tissue see. Mainly an angulated skin tear. Slow venous bleeding noted. No brisk bleeding. Compartments are soft. No significant swelling noted.   Skin:    General: Skin is warm and dry.  Neurological:     Mental Status: He is alert.     Gait: Gait normal.     ED Results / Procedures / Treatments   Labs (all labs ordered are listed, but only abnormal results are displayed) Labs Reviewed  CBC WITH DIFFERENTIAL/PLATELET - Abnormal; Notable for the following components:      Result Value   RBC 2.80 (*)    Hemoglobin 10.4 (*)    HCT 31.3 (*)    MCV 111.8 (*)    MCH 37.1 (*)    Platelets 135 (*)    All other components within normal limits    EKG None  Radiology No results found.  Procedures .Marland KitchenLaceration Repair  Date/Time: 06/09/2023 12:06 PM  Performed by: Achille Rich, PA-C Authorized by: Achille Rich, PA-C   Consent:    Consent obtained:  Verbal   Consent given by:  Patient   Risks, benefits, and alternatives were discussed: yes     Risks discussed:  Infection, pain, need for additional repair, retained foreign body, poor cosmetic result and poor wound healing   Alternatives discussed:  No treatment Universal protocol:    Procedure explained and questions answered to patient or proxy's satisfaction: yes     Patient identity confirmed:  Verbally with patient Anesthesia:    Anesthesia method:  Local infiltration   Local anesthetic:  Lidocaine 1% WITH epi Laceration details:    Location:  Leg   Leg location:  R lower leg   Length (cm):  4 Pre-procedure details:    Preparation:  Patient was prepped and draped in usual sterile fashion Exploration:    Imaging outcome: foreign  body not noted     Wound exploration: wound explored through full range of motion and entire depth of wound visualized     Contaminated: no   Treatment:    Area cleansed with:  Shur-Clens and saline   Amount of cleaning:  Extensive   Irrigation solution:  Sterile saline Skin repair:    Repair method:  Sutures   Suture size:  5-0   Wound  skin closure material used: vicryl.   Suture technique:  Simple interrupted and horizontal mattress   Number of sutures: 6 simple interrupted, 1 horizontal mattress. Approximation:    Approximation:  Close Repair type:    Repair type:  Intermediate Post-procedure details:    Dressing:  Antibiotic ointment, non-adherent dressing and bulky dressing   Procedure completion:  Tolerated well, no immediate complications Comments:     Would was cleansed with Shurclens and irrigated with normal saline. Used lidocaine with epi, but was not successful with hemostatsis. This is superficially more of a skin tear that ends up angling more distally into the subcutaneous tissue slightly, likely why it was continuing to bleed. He does have some potential healthy tissue distally, however I am not able to approximate it even close to the wound edge. I applied sutures through the lower skin layer of the skin tear and into the more distal aspect of the subcutaneous tissue. I used non absorbable suturing given the area and depth. I do believe these will need to be in for longer. I needed to place a horizontal mattress where the source of bleeding was. The wound was watched and there was no additional bleeding or oozing. Bandage placed by nursing.    Medications Ordered in ED Medications  lidocaine-EPINEPHrine (XYLOCAINE W/EPI) 2 %-1:100000 (with pres) injection 20 mL (20 mLs Other Given 06/09/23 1109)    ED Course/ Medical Decision Making/ A&P    Medical Decision Making Amount and/or Complexity of Data Reviewed Labs: ordered.  Risk OTC drugs. Prescription drug  management.   77 y.o. male presents to the ER for evaluation of right lower leg laceration. Differential diagnosis includes but is not limited to laceration, anemia. Vital signs mildly elevated BP otherwise unremarkable. Physical exam as noted above.   Given the amount of bleeding, I will order a CBC. He is not having any pain with ambulation. There is no exposed muscle or bone. I do not think any imaging is needed. The patient reports his tetanus is up to date and he received it last year.   I independently reviewed and interpreted the patient's labs. CBC shows hemoglobin 10.4 appears baseline is around 11.0. He is not feeling symptomatic from this. Platelets are chronically low and at baseline.  Please see procedure noted for additional information.   Hemostasis was achieved with suturing. Wound was closed within 24 hours, but will place patient on an antibiotic. Discussed with him strict return precautions and wound cleaning. Recommended he follow up with his PCP within the next few days for wound re-evaluation. Stable for discharge home with close PCP follow up.   We discussed the results of the labs/imaging. The plan is wound care, antibiotics, follow up for wound re-evaluation. We discussed strict return precautions and red flag symptoms. The patient verbalized their understanding and agrees to the plan. The patient is stable and being discharged home in good condition.  Portions of this report may have been transcribed using voice recognition software. Every effort was made to ensure accuracy; however, inadvertent computerized transcription errors may be present.   Final Clinical Impression(s) / ED Diagnoses Final diagnoses:  Laceration of right lower extremity, initial encounter    Rx / DC Orders ED Discharge Orders          Ordered    cephALEXin (KEFLEX) 500 MG capsule  4 times daily        06/09/23 1618              Lynzy Rawles,  Victory Dakin, PA-C 06/12/23 1229    Derwood Kaplan, MD 06/15/23 [id number]

## 2023-06-15 DIAGNOSIS — I2510 Atherosclerotic heart disease of native coronary artery without angina pectoris: Secondary | ICD-10-CM | POA: Diagnosis not present

## 2023-06-15 DIAGNOSIS — E7800 Pure hypercholesterolemia, unspecified: Secondary | ICD-10-CM | POA: Diagnosis not present

## 2023-06-15 DIAGNOSIS — S81811D Laceration without foreign body, right lower leg, subsequent encounter: Secondary | ICD-10-CM | POA: Diagnosis not present

## 2023-06-16 ENCOUNTER — Other Ambulatory Visit (HOSPITAL_COMMUNITY): Payer: Self-pay

## 2023-06-16 ENCOUNTER — Encounter: Payer: Self-pay | Admitting: Hematology and Oncology

## 2023-06-16 ENCOUNTER — Other Ambulatory Visit: Payer: Self-pay

## 2023-06-17 ENCOUNTER — Other Ambulatory Visit (HOSPITAL_COMMUNITY): Payer: Self-pay

## 2023-06-17 ENCOUNTER — Other Ambulatory Visit: Payer: Self-pay

## 2023-06-17 MED ORDER — HYDROCORTISONE 2.5 % EX CREA
1.0000 | TOPICAL_CREAM | Freq: Two times a day (BID) | CUTANEOUS | 1 refills | Status: DC | PRN
  Filled 2023-06-17: qty 60, 30d supply, fill #0
  Filled 2023-09-03: qty 60, 30d supply, fill #1

## 2023-06-19 ENCOUNTER — Other Ambulatory Visit (HOSPITAL_COMMUNITY): Payer: Self-pay

## 2023-06-22 DIAGNOSIS — I4891 Unspecified atrial fibrillation: Secondary | ICD-10-CM | POA: Diagnosis not present

## 2023-06-22 DIAGNOSIS — Z8679 Personal history of other diseases of the circulatory system: Secondary | ICD-10-CM | POA: Diagnosis not present

## 2023-06-22 DIAGNOSIS — Z09 Encounter for follow-up examination after completed treatment for conditions other than malignant neoplasm: Secondary | ICD-10-CM | POA: Diagnosis not present

## 2023-06-30 DIAGNOSIS — Z6825 Body mass index (BMI) 25.0-25.9, adult: Secondary | ICD-10-CM | POA: Diagnosis not present

## 2023-06-30 DIAGNOSIS — S81801D Unspecified open wound, right lower leg, subsequent encounter: Secondary | ICD-10-CM | POA: Diagnosis not present

## 2023-07-08 ENCOUNTER — Other Ambulatory Visit: Payer: Self-pay | Admitting: *Deleted

## 2023-07-08 DIAGNOSIS — Z9889 Other specified postprocedural states: Secondary | ICD-10-CM

## 2023-07-16 ENCOUNTER — Ambulatory Visit (HOSPITAL_COMMUNITY)
Admission: RE | Admit: 2023-07-16 | Discharge: 2023-07-16 | Disposition: A | Discharge status: Home / Self Care | Payer: PPO | Source: Ambulatory Visit | Attending: Vascular Surgery

## 2023-07-16 ENCOUNTER — Other Ambulatory Visit: Payer: Self-pay

## 2023-07-16 ENCOUNTER — Ambulatory Visit: Payer: PPO | Admitting: Physician Assistant

## 2023-07-16 VITALS — BP 160/82 | HR 62 | Temp 97.9°F | Ht 64.0 in | Wt 145.1 lb

## 2023-07-16 DIAGNOSIS — I7143 Infrarenal abdominal aortic aneurysm, without rupture: Secondary | ICD-10-CM | POA: Diagnosis not present

## 2023-07-16 DIAGNOSIS — Z9889 Other specified postprocedural states: Secondary | ICD-10-CM | POA: Diagnosis not present

## 2023-07-16 NOTE — Progress Notes (Signed)
[facility] [place] HISTORY AND PHYSICAL   History of Present Illness:  Patient is a 77 y.o. year old male who presents for evaluation of abdominal aortic aneurysm S/P EVAR by Dr. Edilia Bo 2019.  The aneurysm has shrunk in size and there is no evidence of endoleak. On a follow-up study had a type II endoleak. A year ago the largest diameter was 2.43 cm.  A CTA was done 01/24/21 with showed.  He denies abdominal pain out of the ordinary.  He has history of DDD/herniation and has lumbar pain on the right without radiculopathy.   He denies claudication, rest pain or foot non helaing wounds.    IMPRESSION: VASCULAR   Stable infrarenal saccular aneurysm diameter following aortic stent graft repair at 3.2 cm.   Slight interval increased contrast enhancement of the anterior inferior aspect of the small aortic saccular aneurysm compatible with a persistent endoleak without clear source demonstrated. Type 2 endoleak is favored as detailed above.   Stable aneurysmal disease of the right common iliac artery, maximal diameter 2.5 cm.    He is here today for follow up Duplex of the EVAR.  Of note he was trimming bushes in the yard and cut hi right anterior shin with a saw blade.  The wound is being followed by his PCP.  He has completed a course of Keflex and is using soap and water with topical antibiotics and dry dressing daily.  He denies fever and chills.   He is on Eliquis for Afib and Lipitor daily.  Past Medical History:  Diagnosis Date   Alcoholism (HCC) 03/15/2014   Anemia    Arthritis    all over, some ra   Bradycardia by electrocardiogram    BB decreased 11/12/11   CAD (coronary artery disease)    last cath=01/2008, PCI   CHF (congestive heart failure) (HCC)    Dyslipidemia    ED (erectile dysfunction)    viagra helps   GERD (gastroesophageal reflux disease)    HTN (hypertension)    Iron overload 03/15/2014   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) 03/15/2014   pt  .denies at preop   Murmur, cardiac    faint early systolic grade 1/6 aortic ejection murmur   Myocardial infarction [facility])    Peripheral vascular disease (HCC)    Rib pain 04/16/2014   SCCA (squamous cell carcinoma) of skin 12/19/2020   Left Malar Cheek Lateral (well diff) (tx p bx)   SCCA (squamous cell carcinoma) of skin 12/19/2020   Right Scalp Posterior (in situ) (tx p bx)   SCCA (squamous cell carcinoma) of skin 12/19/2020   Right Scalp Anterior (in situ) (tx p bx)   Skin cancer    basal cell ca X 4    Past Surgical History:  Procedure Laterality Date   ABDOMINAL AORTIC ENDOVASCULAR STENT GRAFT N/A 01/13/2018   Procedure: ABDOMINAL AORTIC ENDOVASCULAR STENT GRAFT;  Surgeon: Chuck Hint, MD;  Location: [facility] OR;  Service: Vascular;  Laterality: N/A;   CORONARY ANGIOPLASTY WITH STENT PLACEMENT  01/31/2008   PCI with cutting balloon arthrectomy of the ostium of the circ obtuse marg 2 vessel   CORONARY ANGIOPLASTY WITH STENT PLACEMENT  01/27/08   nl LV function, PTCA/stenting of LAD with 3x71mm Cypher post dilated 3.72mm   EYE SURGERY     cataract bil   FRACTURE SURGERY     right elbow when 77 years old   HERNIA REPAIR     IR RADIOLOGIST EVAL &  MGMT  03/27/2021   KNEE ARTHROSCOPY Left    MOHS SURGERY     x4   NASAL SEPTUM SURGERY     x2   TOTAL KNEE ARTHROPLASTY Left 11/08/2018   Procedure: TOTAL KNEE ARTHROPLASTY;  Surgeon: Sheral Apley, MD;  Location: [facility] ORS;  Service: Orthopedics;  Laterality: Left;     Social History Social History   Tobacco Use   Smoking status: Former    Current packs/day: 0.00    Types: Cigarettes    Quit date: 10/31/1982    Years since quitting: 40.7   Smokeless tobacco: Never  Vaping Use   Vaping status: Never Used  Substance Use Topics   Alcohol use: Yes    Alcohol/week: 28.0 standard drinks of alcohol    Types: 28 Cans of beer per week    Comment: "3-4 beers or hard apple ciders/ day" 01/10/2018   Drug use: No    Family  History Family History  Problem Relation Age of Onset   Heart failure Mother        heart disease   Heart disease Mother    Heart failure Father        heart disease   Heart disease Father    Heart disease Brother    Heart failure Brother        "bad heart valve"    Allergies  Allergies  Allergen Reactions   Lisinopril Other (See Comments)    PHOTOSENSITIVITY ON ARMS   Minocin [Minocycline Hcl]     Flank pain   Sulfa Antibiotics     Drop in BP   Thiazide-Type Diuretics Other (See Comments)    "Lowered my blood pressure"   Codeine Nausea Only     Current Outpatient Medications  Medication Sig Dispense Refill   apixaban (ELIQUIS) 5 MG TABS tablet Take 1 tablet (5 mg total) by mouth 2 (two) times daily. 200 tablet 1   atorvastatin (LIPITOR) 20 MG tablet Take 1 tablet (20 mg total) by mouth daily. 90 tablet 1   augmented betamethasone dipropionate (DIPROLENE-AF) 0.05 % cream Apply to affected area on back up to 2 times a day as needed (not to face, groin or underarms) 50 g 3   bisacodyl (DULCOLAX) 5 MG EC tablet Take 5 mg by mouth daily as needed for moderate constipation.     bisoprolol (ZEBETA) 10 MG tablet Take 1.5 tablets (15 mg total) by mouth daily. 135 tablet 1   cholecalciferol (VITAMIN D) 1000 units tablet Take 1,000 Units by mouth daily.     Fluocinolone Acetonide Scalp 0.01 % OIL Apply to the affected area on scalp every night at bedtime as needed (not to face, groin or underarms) 118.28 mL 3   hydrocortisone (ANUSOL-HC) 2.5 % rectal cream Place 1 application rectally as needed for hemorrhoids or itching.     hydrocortisone 2.5 % cream Apply 1 Application to affected areas topically 2 (two) times daily. 60 g 1   loratadine (CLARITIN) 10 MG tablet Take 10 mg by mouth daily.      pantoprazole (PROTONIX) 40 MG tablet Take one tablet by mouth once daily. 90 tablet 1   Probiotic Product (PROBIOTIC DAILY PO) Take 1 tablet by mouth daily in the afternoon.     tamsulosin  (FLOMAX) 0.4 MG CAPS capsule Take one capsule by mouth once daily. 90 capsule 1   atorvastatin (LIPITOR) 20 MG tablet Take 1 tablet by mouth once daily. 90 tablet 1   bisoprolol (ZEBETA) 10 MG tablet  Take 1.5 tablets (15mg ) by mouth once daily. 135 tablet 1   cephALEXin (KEFLEX) 500 MG capsule Take 1 capsule (500 mg total) by mouth 4 (four) times daily. (Patient not taking: Reported on 07/16/2023) 20 capsule 0   cholecalciferol (VITAMIN D3) 25 MCG (1000 UNIT) tablet Take one tablet by mouth once daily. 100 tablet 3   hydrocortisone 2.5 % cream Apply 1 Application topically to affected areas 2 (two) times daily as needed. 60 g 1   No current facility-administered medications for this visit.    ROS:   General:  No weight loss, Fever, chills  HEENT: No recent headaches, no nasal bleeding, no visual changes, no sore throat  Neurologic: No dizziness, blackouts, seizures. No recent symptoms of stroke or mini- stroke. No recent episodes of slurred speech, or temporary blindness.  Cardiac: No recent episodes of chest pain/pressure, no shortness of breath at rest.  No shortness of breath with exertion.  positive history of atrial fibrillation or irregular heartbeat  Vascular: No history of rest pain in feet.  No history of claudication.  No history of non-healing ulcer, No history of DVT   Pulmonary: No home oxygen, no productive cough, no hemoptysis,  No asthma or wheezing  Musculoskeletal:  [ ]  Arthritis, [x ] Low back pain,  [ x] Joint pain  Hematologic:No history of hypercoagulable state.  No history of easy bleeding.  No history of anemia  Gastrointestinal: No hematochezia or melena,  No gastroesophageal reflux, no trouble swallowing  Urinary: [ ]  chronic Kidney disease, [ ]  on HD - [ ]  MWF or [ ]  TTHS, [ ]  Burning with urination, [ ]  Frequent urination, [ ]  Difficulty urinating;   Skin: No rashes  Psychological: No history of anxiety,  No history of depression   Physical  Examination  Vitals:   07/16/23 1003  BP: (!) 160/82  Pulse: 62  Temp: 97.9 F (36.6 C)  TempSrc: Temporal  SpO2: 99%  Weight: 145 lb 1.6 oz (65.8 kg)  Height: 5\' 4"  (1.626 m)    Body mass index is 24.91 kg/m.  General:  Alert and oriented, no acute distress HEENT: Normal Neck: No bruit or JVD Pulmonary: Clear to auscultation bilaterally Cardiac: Regular Rate and Rhythm without murmur Gastrointestinal: Soft, non-tender, non-distended, no mass, no scars Skin: No rash    Mild cellulitis with edema surrounding the skin lesion  Extremity Pulses:   radial, femoral, dorsalis pedis, posterior tibial brisk doppler signals bilaterally Musculoskeletal: No deformity positive edema right lower leg post injury  Neurologic: Upper and lower extremity motor 5/5 and symmetric  DATA:  Endovascular Aortic Repair (EVAR):  +----------+----------------+-------------------+-------------------+------  --+           Diameter AP (cm)Diameter Trans (cm)Velocities  (cm/sec)Comments  +----------+----------------+-------------------+-------------------+------  --+  Aorta    3.11            3.03               33                  endoleak  +----------+----------------+-------------------+-------------------+------  --+  Right Limb0.77            0.82               81                            +----------+----------------+-------------------+-------------------+------  --+  Left Limb 1.30  1.37               156                           +----------+----------------+-------------------+-------------------+------  --+    Summary:  Abdominal Aorta: Patent endovascular aneurysm repair with evidence of  endoleak.   ASSESSMENT/PLAN: The AAA sac continues to expand.  From 1 year ago it has increased in size by 0.5 cm larger with evidence of endo leak.  I will order a CTA of the Abdomin and pelvis and have him f/u with Dr. Hetty Blend.    He states he has lumbar  pain, but he associates this with his herniated disc problems.    He has good in flow with brisk doppler signals B LE.  The right leg wound is being followed by his PCP.    If he develops sudden abdominal of lumbar pain he should call 911.  He agrees with the plan above.        Mosetta Pigeon PA-C [facility] [place] Office: [phone]  MD on call Randie Heinz

## 2023-07-20 DIAGNOSIS — Z0000 Encounter for general adult medical examination without abnormal findings: Secondary | ICD-10-CM | POA: Diagnosis not present

## 2023-07-20 DIAGNOSIS — Z6824 Body mass index (BMI) 24.0-24.9, adult: Secondary | ICD-10-CM | POA: Diagnosis not present

## 2023-07-20 DIAGNOSIS — Z23 Encounter for immunization: Secondary | ICD-10-CM | POA: Diagnosis not present

## 2023-07-20 DIAGNOSIS — I7143 Infrarenal abdominal aortic aneurysm, without rupture: Secondary | ICD-10-CM | POA: Diagnosis not present

## 2023-07-20 DIAGNOSIS — I10 Essential (primary) hypertension: Secondary | ICD-10-CM | POA: Diagnosis not present

## 2023-07-20 DIAGNOSIS — I4891 Unspecified atrial fibrillation: Secondary | ICD-10-CM | POA: Diagnosis not present

## 2023-07-20 DIAGNOSIS — Z1331 Encounter for screening for depression: Secondary | ICD-10-CM | POA: Diagnosis not present

## 2023-07-20 DIAGNOSIS — I2510 Atherosclerotic heart disease of native coronary artery without angina pectoris: Secondary | ICD-10-CM | POA: Diagnosis not present

## 2023-07-20 DIAGNOSIS — R809 Proteinuria, unspecified: Secondary | ICD-10-CM | POA: Diagnosis not present

## 2023-07-20 DIAGNOSIS — S81801D Unspecified open wound, right lower leg, subsequent encounter: Secondary | ICD-10-CM | POA: Diagnosis not present

## 2023-07-20 DIAGNOSIS — D6869 Other thrombophilia: Secondary | ICD-10-CM | POA: Diagnosis not present

## 2023-07-20 DIAGNOSIS — E7800 Pure hypercholesterolemia, unspecified: Secondary | ICD-10-CM | POA: Diagnosis not present

## 2023-07-28 DIAGNOSIS — R809 Proteinuria, unspecified: Secondary | ICD-10-CM | POA: Diagnosis not present

## 2023-08-02 DIAGNOSIS — Z23 Encounter for immunization: Secondary | ICD-10-CM | POA: Diagnosis not present

## 2023-08-04 ENCOUNTER — Other Ambulatory Visit: Payer: PPO

## 2023-08-04 ENCOUNTER — Ambulatory Visit
Admission: RE | Admit: 2023-08-04 | Discharge: 2023-08-04 | Disposition: A | Discharge status: Home / Self Care | Payer: PPO | Source: Ambulatory Visit | Attending: Vascular Surgery

## 2023-08-04 DIAGNOSIS — I714 Abdominal aortic aneurysm, without rupture, unspecified: Secondary | ICD-10-CM | POA: Diagnosis not present

## 2023-08-04 DIAGNOSIS — I7143 Infrarenal abdominal aortic aneurysm, without rupture: Secondary | ICD-10-CM

## 2023-08-04 DIAGNOSIS — Z9889 Other specified postprocedural states: Secondary | ICD-10-CM | POA: Diagnosis not present

## 2023-08-04 MED ORDER — IOPAMIDOL (ISOVUE-370) INJECTION 76%
75.0000 mL | Freq: Once | INTRAVENOUS | Status: AC | PRN
  Administered 2023-08-04: 75 mL via INTRAVENOUS

## 2023-08-10 NOTE — Progress Notes (Signed)
Patient ID: Corey Garcia, male   DOB: 06-May-1946, 77 y.o.   MRN: [id number]  Reason for Consult: Follow-up   Referred by Jackelyn Poling, DO  Subjective:     HPI  Corey Garcia is a 77 y.o. male presenting for follow-up who is status post EVAR by Dr. Durwin Nora in 2019 for AAA.  He was seen last month when duplex ultrasound noted a 0.5 cm increase in diameter from last year.  He denies any new or unusual abdominal or back pain.  He is a former smoker.  Past Medical History:  Diagnosis Date   Alcoholism (HCC) 03/15/2014   Anemia    Arthritis    all over, some ra   Bradycardia by electrocardiogram    BB decreased 11/12/11   CAD (coronary artery disease)    last cath=01/2008, PCI   CHF (congestive heart failure) (HCC)    Dyslipidemia    ED (erectile dysfunction)    viagra helps   GERD (gastroesophageal reflux disease)    HTN (hypertension)    Iron overload 03/15/2014   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) 03/15/2014   pt .denies at preop   Murmur, cardiac    faint early systolic grade 1/6 aortic ejection murmur   Myocardial infarction [facility])    Peripheral vascular disease (HCC)    Rib pain 04/16/2014   SCCA (squamous cell carcinoma) of skin 12/19/2020   Left Malar Cheek Lateral (well diff) (tx p bx)   SCCA (squamous cell carcinoma) of skin 12/19/2020   Right Scalp Posterior (in situ) (tx p bx)   SCCA (squamous cell carcinoma) of skin 12/19/2020   Right Scalp Anterior (in situ) (tx p bx)   Skin cancer    basal cell ca X 4   Family History  Problem Relation Age of Onset   Heart failure Mother        heart disease   Heart disease Mother    Heart failure Father        heart disease   Heart disease Father    Heart disease Brother    Heart failure Brother        "bad heart valve"   Past Surgical History:  Procedure Laterality Date   ABDOMINAL AORTIC ENDOVASCULAR STENT GRAFT N/A 01/13/2018   Procedure: ABDOMINAL AORTIC ENDOVASCULAR STENT GRAFT;  Surgeon: Chuck Hint, MD;  Location: [facility] OR;  Service: Vascular;  Laterality: N/A;   CORONARY ANGIOPLASTY WITH STENT PLACEMENT  01/31/2008   PCI with cutting balloon arthrectomy of the ostium of the circ obtuse marg 2 vessel   CORONARY ANGIOPLASTY WITH STENT PLACEMENT  01/27/08   nl LV function, PTCA/stenting of LAD with 3x71mm Cypher post dilated 3.60mm   EYE SURGERY     cataract bil   FRACTURE SURGERY     right elbow when 77 years old   HERNIA REPAIR     IR RADIOLOGIST EVAL & MGMT  03/27/2021   KNEE ARTHROSCOPY Left    MOHS SURGERY     x4   NASAL SEPTUM SURGERY     x2   TOTAL KNEE ARTHROPLASTY Left 11/08/2018   Procedure: TOTAL KNEE ARTHROPLASTY;  Surgeon: Sheral Apley, MD;  Location: [facility] ORS;  Service: Orthopedics;  Laterality: Left;    Short Social History:  Social History   Tobacco Use   Smoking status: Former    Current packs/day: 0.00    Types: Cigarettes    Quit date: 10/31/1982    Years since quitting: 40.8  Smokeless tobacco: Never  Substance Use Topics   Alcohol use: Yes    Alcohol/week: 28.0 standard drinks of alcohol    Types: 28 Cans of beer per week    Comment: "3-4 beers or hard apple ciders/ day" 01/10/2018    Allergies  Allergen Reactions   Lisinopril Other (See Comments)    PHOTOSENSITIVITY ON ARMS   Minocin [Minocycline Hcl]     Flank pain   Sulfa Antibiotics     Drop in BP   Thiazide-Type Diuretics Other (See Comments)    "Lowered my blood pressure"   Codeine Nausea Only    Current Outpatient Medications  Medication Sig Dispense Refill   apixaban (ELIQUIS) 5 MG TABS tablet Take 1 tablet (5 mg total) by mouth 2 (two) times daily. 200 tablet 1   atorvastatin (LIPITOR) 20 MG tablet Take 1 tablet (20 mg total) by mouth daily. 90 tablet 1   augmented betamethasone dipropionate (DIPROLENE-AF) 0.05 % cream Apply to affected area on back up to 2 times a day as needed (not to face, groin or underarms) 50 g 3   bisoprolol (ZEBETA) 10 MG tablet Take 1.5 tablets  (15 mg total) by mouth daily. 135 tablet 1   cholecalciferol (VITAMIN D3) 25 MCG (1000 UNIT) tablet Take one tablet by mouth once daily. 100 tablet 3   Fluocinolone Acetonide Scalp 0.01 % OIL Apply to the affected area on scalp every night at bedtime as needed (not to face, groin or underarms) 118.28 mL 3   fluticasone (FLONASE) 50 MCG/ACT nasal spray Place 2 sprays into both nostrils daily.     hydrocortisone (ANUSOL-HC) 2.5 % rectal cream Place 1 application rectally as needed for hemorrhoids or itching.     loratadine (CLARITIN) 10 MG tablet Take 10 mg by mouth daily.      Olopatadine HCl (PATADAY OP) Place 1 Drop/kg into both eyes daily at 12 noon.     pantoprazole (PROTONIX) 40 MG tablet Take one tablet by mouth once daily. 90 tablet 1   Probiotic Product (PROBIOTIC DAILY PO) Take 1 tablet by mouth daily in the afternoon.     Simethicone (GAS-X PO) Take 80 mg by mouth daily at 6 (six) AM. 2 tablets     tamsulosin (FLOMAX) 0.4 MG CAPS capsule Take one capsule by mouth once daily. 90 capsule 1   No current facility-administered medications for this visit.    REVIEW OF SYSTEMS  Negative other than noted in HPI     Objective:  Objective   Vitals:   08/13/23 1155  BP: (!) 176/90  Pulse: 66  Resp: 20  Temp: 98 F (36.7 C)  SpO2: 99%  Weight: 143 lb (64.9 kg)  Height: 5\' 4"  (1.626 m)   Body mass index is 24.55 kg/m.  Physical Exam General: no acute distress Cardiac: hemodynamically stable, nontachycardic Pulm: normal work of breathing GI: non-tender, no pulsatile mass Neuro: alert, no focal deficit Extremities: no edema, cyanosis or wounds Vascular:   Right: Palpable femoral  Left: Palpable femoral   Data: CTA obtained on 08/05/2023 independently reviewed Stable size of abdominal aortic aneurysm at 3.2 cm when compared to CTA from May 2022 Stable size of right common iliac artery aneurysm measuring 2.7 cm when compared to CTA from May 2022     Assessment/Plan:      Corey Garcia is a 77 y.o. male who is status post EVAR in 2019 for saccular abdominal aortic aneurysm.  We discussed that while there is a type  II endoleak that the aneurysm size is stable at 3.2 cm when compared the most recent CT to the one from May 2022 and that the right common iliac artery aneurysm is also stable at 2.7 cm. I explained that the duplex performed last year in November 2023 likely did not capture the area of the saccular aneurysm which is why the aorta was only measured at a maximal diameter of 2.4 during the year.  I explained that his aneurysm has been treated successfully with EVAR and has been stable in size at 3.2 cm and we will plan to continue with annual surveillance with duplex ultrasound.     Recommendations to optimize cardiovascular risk: Abstinence from all tobacco products. Blood glucose control with goal A1c < 7%. Blood pressure control with goal blood pressure < 140/90 mmHg. Lipid reduction therapy with goal LDL-C <100 mg/dL  Aspirin 81mg  PO QD.  Atorvastatin 40-80mg  PO QD (or other "high intensity" statin therapy).     Daria Pastures MD [facility]

## 2023-08-11 ENCOUNTER — Ambulatory Visit: Payer: PPO | Attending: Cardiovascular Disease | Admitting: Cardiovascular Disease

## 2023-08-11 ENCOUNTER — Encounter: Payer: Self-pay | Admitting: Cardiovascular Disease

## 2023-08-11 VITALS — BP 126/68 | HR 72 | Ht 64.0 in | Wt 140.8 lb

## 2023-08-11 DIAGNOSIS — I4821 Permanent atrial fibrillation: Secondary | ICD-10-CM

## 2023-08-11 DIAGNOSIS — Z8679 Personal history of other diseases of the circulatory system: Secondary | ICD-10-CM

## 2023-08-11 DIAGNOSIS — I25118 Atherosclerotic heart disease of native coronary artery with other forms of angina pectoris: Secondary | ICD-10-CM | POA: Diagnosis not present

## 2023-08-11 DIAGNOSIS — E7800 Pure hypercholesterolemia, unspecified: Secondary | ICD-10-CM

## 2023-08-11 DIAGNOSIS — I350 Nonrheumatic aortic (valve) stenosis: Secondary | ICD-10-CM | POA: Diagnosis not present

## 2023-08-11 DIAGNOSIS — I10 Essential (primary) hypertension: Secondary | ICD-10-CM | POA: Diagnosis not present

## 2023-08-11 DIAGNOSIS — Z9889 Other specified postprocedural states: Secondary | ICD-10-CM | POA: Diagnosis not present

## 2023-08-11 NOTE — Patient Instructions (Signed)
Medication Instructions:  No changes *If you need a refill on your cardiac medications before your next appointment, please call your pharmacy*  Testing/Procedures: Your physician has requested that you have an echocardiogram in January. Echocardiography is a painless test that uses sound waves to create images of your heart. It provides your doctor with information about the size and shape of your heart and how well your heart's chambers and valves are working. This procedure takes approximately one hour. There are no restrictions for this procedure. Please do NOT wear cologne, perfume, aftershave, or lotions (deodorant is allowed). Please arrive 15 minutes prior to your appointment time.  Please note: We ask at that you not bring children with you during ultrasound (echo/ vascular) testing. Due to room size and safety concerns, children are not allowed in the ultrasound rooms during exams. Our front office staff cannot provide observation of children in our lobby area while testing is being conducted. An adult accompanying a patient to their appointment will only be allowed in the ultrasound room at the discretion of the ultrasound technician under special circumstances. We apologize for any inconvenience.    Follow-Up: At [facility], you and your health needs are our priority.  As part of our continuing mission to provide you with exceptional heart care, we have created designated Provider Care Teams.  These Care Teams include your primary Cardiologist (physician) and Advanced Practice Providers (APPs -  Physician Assistants and Nurse Practitioners) who all work together to provide you with the care you need, when you need it.  We recommend signing up for the patient portal called "MyChart".  Sign up information is provided on this After Visit Summary.  MyChart is used to connect with patients for Virtual Visits (Telemedicine).  Patients are able to view lab/test results, encounter  notes, upcoming appointments, etc.  Non-urgent messages can be sent to your provider as well.   To learn more about what you can do with MyChart, go to [email or web address].    Your next appointment:   6 month(s)  Provider:   Thurmon Fair, MD

## 2023-08-11 NOTE — Progress Notes (Signed)
Cardiology Office Note   Date:  08/11/2023   ID:  Chanceler, Kolb 07-23-1946, MRN [id number]  PCP:  Jackelyn Poling, DO  Cardiologist:  Dr.Danilo Cappiello   Chief Complaint  Patient presents with   Cardiac Valve Problem      History of Present Illness: Corey Garcia is a 77 y.o. male with longstanding persistent (probably permanent) atrial fibrillation, CAD, vasovagal syncope, moderate calcific aortic valve stenosis, AAA s/p EVAR (Dr. Edilia Bo), hypertension, hyperlipidemia, chronic liver disease. He has not required coronary intervention since 2009 when he had cutting balloon atherectomy of the OM2 and DES to the LAD, residual 50% RCA stenosis. He had a low risk nuclear study April 2014.    He has no physical complaints.  He has to take on a lot more household chores after his wife passed away about 3 years ago.  He continues to keep a garden.  He walks 1 or 2 miles every day. The patient specifically denies any chest pain at rest or with exertion, dyspnea at rest or with exertion, orthopnea, paroxysmal nocturnal dyspnea, syncope, palpitations, focal neurological deficits, intermittent claudication, lower extremity edema, unexplained weight gain, cough, hemoptysis or wheezing.  He denies falls or bleeding problems.  His echocardiogram performed in July 2022 showed mildly elevated aortic valve gradients (mean gradient 10 mmHg), although the dimensionless index 0.29 suggested that he may have paradoxical low-flow low gradient moderate AS.  Follow-up echocardiogram from July 2024 shows left ventricular systolic function remains normal there is been progression to severe aortic stenosis (still with paradoxical low-flow low gradient characteristics with a mean gradient of 20 mmHg calculated valve area of 0.7 cm and a dimensionless valve index of 0.22).  His EVAR ultrasound shows that he has a less significant endoleak at the level of the abdominal aortic aneurysm and the aneurysm has not  changed in size at 3.2 cm.  His most recent lipid profile shows excellent lipid parameters with a HDL of 66 and LDL of 62 and normal triglycerides.  Creatinine is mildly elevated at 1.27 on labs performed in February 2024.  Past Medical History:  Diagnosis Date   Alcoholism (HCC) 03/15/2014   Anemia    Arthritis    all over, some ra   Bradycardia by electrocardiogram    BB decreased 11/12/11   CAD (coronary artery disease)    last cath=01/2008, PCI   CHF (congestive heart failure) (HCC)    Dyslipidemia    ED (erectile dysfunction)    viagra helps   GERD (gastroesophageal reflux disease)    HTN (hypertension)    Iron overload 03/15/2014   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) 03/15/2014   pt .denies at preop   Murmur, cardiac    faint early systolic grade 1/6 aortic ejection murmur   Myocardial infarction [facility])    Peripheral vascular disease (HCC)    Rib pain 04/16/2014   SCCA (squamous cell carcinoma) of skin 12/19/2020   Left Malar Cheek Lateral (well diff) (tx p bx)   SCCA (squamous cell carcinoma) of skin 12/19/2020   Right Scalp Posterior (in situ) (tx p bx)   SCCA (squamous cell carcinoma) of skin 12/19/2020   Right Scalp Anterior (in situ) (tx p bx)   Skin cancer    basal cell ca X 4    Past Surgical History:  Procedure Laterality Date   ABDOMINAL AORTIC ENDOVASCULAR STENT GRAFT N/A 01/13/2018   Procedure: ABDOMINAL AORTIC ENDOVASCULAR STENT GRAFT;  Surgeon: Chuck Hint, MD;  Location: [facility]  OR;  Service: Vascular;  Laterality: N/A;   CORONARY ANGIOPLASTY WITH STENT PLACEMENT  01/31/2008   PCI with cutting balloon arthrectomy of the ostium of the circ obtuse marg 2 vessel   CORONARY ANGIOPLASTY WITH STENT PLACEMENT  01/27/08   nl LV function, PTCA/stenting of LAD with 3x15mm Cypher post dilated 3.4mm   EYE SURGERY     cataract bil   FRACTURE SURGERY     right elbow when 77 years old   HERNIA REPAIR     IR RADIOLOGIST EVAL & MGMT  03/27/2021   KNEE ARTHROSCOPY Left     MOHS SURGERY     x4   NASAL SEPTUM SURGERY     x2   TOTAL KNEE ARTHROPLASTY Left 11/08/2018   Procedure: TOTAL KNEE ARTHROPLASTY;  Surgeon: Sheral Apley, MD;  Location: [facility] ORS;  Service: Orthopedics;  Laterality: Left;     Current Outpatient Medications  Medication Sig Dispense Refill   apixaban (ELIQUIS) 5 MG TABS tablet Take 1 tablet (5 mg total) by mouth 2 (two) times daily. 200 tablet 1   atorvastatin (LIPITOR) 20 MG tablet Take 1 tablet (20 mg total) by mouth daily. 90 tablet 1   atorvastatin (LIPITOR) 20 MG tablet Take 1 tablet by mouth once daily. 90 tablet 1   augmented betamethasone dipropionate (DIPROLENE-AF) 0.05 % cream Apply to affected area on back up to 2 times a day as needed (not to face, groin or underarms) 50 g 3   bisoprolol (ZEBETA) 10 MG tablet Take 1.5 tablets (15 mg total) by mouth daily. 135 tablet 1   cholecalciferol (VITAMIN D3) 25 MCG (1000 UNIT) tablet Take one tablet by mouth once daily. 100 tablet 3   Fluocinolone Acetonide Scalp 0.01 % OIL Apply to the affected area on scalp every night at bedtime as needed (not to face, groin or underarms) 118.28 mL 3   fluticasone (FLONASE) 50 MCG/ACT nasal spray Place 2 sprays into both nostrils daily.     hydrocortisone (ANUSOL-HC) 2.5 % rectal cream Place 1 application rectally as needed for hemorrhoids or itching.     loratadine (CLARITIN) 10 MG tablet Take 10 mg by mouth daily.      Olopatadine HCl (PATADAY OP) Place 1 Drop/kg into both eyes daily at 12 noon.     pantoprazole (PROTONIX) 40 MG tablet Take one tablet by mouth once daily. 90 tablet 1   Probiotic Product (PROBIOTIC DAILY PO) Take 1 tablet by mouth daily in the afternoon.     Simethicone (GAS-X PO) Take 80 mg by mouth daily at 6 (six) AM. 2 tablets     tamsulosin (FLOMAX) 0.4 MG CAPS capsule Take one capsule by mouth once daily. 90 capsule 1   bisacodyl (DULCOLAX) 5 MG EC tablet Take 5 mg by mouth daily as needed for moderate constipation. (Patient  not taking: Reported on 08/11/2023)     bisoprolol (ZEBETA) 10 MG tablet Take 1.5 tablets (15mg ) by mouth once daily. 135 tablet 1   cephALEXin (KEFLEX) 500 MG capsule Take 1 capsule (500 mg total) by mouth 4 (four) times daily. (Patient not taking: Reported on 08/11/2023) 20 capsule 0   cholecalciferol (VITAMIN D) 1000 units tablet Take 1,000 Units by mouth daily.     hydrocortisone 2.5 % cream Apply 1 Application to affected areas topically 2 (two) times daily. 60 g 1   hydrocortisone 2.5 % cream Apply 1 Application topically to affected areas 2 (two) times daily as needed. 60 g 1   No  current facility-administered medications for this visit.    Allergies:   Lisinopril, Minocin [minocycline hcl], Sulfa antibiotics, Thiazide-type diuretics, and Codeine    Social History:  The patient  reports that he quit smoking about 40 years ago. His smoking use included cigarettes. He has never used smokeless tobacco. He reports current alcohol use of about 28.0 standard drinks of alcohol per week. He reports that he does not use drugs.   Family History:  The patient's family history includes Heart disease in his brother, father, and mother; Heart failure in his brother, father, and mother.    ROS: Please see history of present illness. All other systems are reviewed and are negative.  PHYSICAL EXAM: VS:  BP 126/68 (BP Location: Left Arm, Patient Position: Sitting, Cuff Size: Normal)   Pulse 72   Ht 5\' 4"  (1.626 m)   Wt 140 lb 12.8 oz (63.9 kg)   SpO2 96%   BMI 24.17 kg/m  , BMI Body mass index is 24.17 kg/m.    General: Alert, oriented x3, no distress, appears well Head: no evidence of trauma, PERRL, EOMI, no exophtalmos or lid lag, no myxedema, no xanthelasma; normal ears, nose and oropharynx Neck: normal jugular venous pulsations and no hepatojugular reflux; brisk carotid pulses without delay and bilateral carotid bruits rating from the chest Chest: clear to auscultation, no signs of  consolidation by percussion or palpation, normal fremitus, symmetrical and full respiratory excursions Cardiovascular: normal position and quality of the apical impulse, regular rhythm, normal first and distinct second heart sounds, 3/6 mid peaking aortic ejection murmur radiating broadly across the chest into both carotids no diastolic murmurs, rubs or gallops Abdomen: no tenderness or distention, no masses by palpation, no abnormal pulsatility or arterial bruits, normal bowel sounds, no hepatosplenomegaly Extremities: no clubbing, cyanosis or edema; 2+ radial, ulnar and brachial pulses bilaterally; 2+ right femoral, posterior tibial and dorsalis pedis pulses; 2+ left femoral, posterior tibial and dorsalis pedis pulses; no subclavian or femoral bruits Neurological: grossly nonfocal Psych: Normal mood and affect     ECHO 03/09/2023:     1. Calcified aortic valve with severe AS (mean gradient 20 mmHg, AVA 0.7  cm2, DI 0.22); findings suggest low flow, low gradient AS.   2. Left ventricular ejection fraction, by estimation, is 60 to 65%. The  left ventricle has normal function. The left ventricle has no regional  wall motion abnormalities. Left ventricular diastolic function could not  be evaluated.   3. Right ventricular systolic function is normal. The right ventricular  size is normal. There is normal pulmonary artery systolic pressure.   4. Left atrial size was moderately dilated.   5. Right atrial size was moderately dilated.   6. The mitral valve is normal in structure. Mild mitral valve  regurgitation. No evidence of mitral stenosis. Moderate mitral annular  calcification.   7. Tricuspid valve regurgitation is mild to moderate.   8. The aortic valve is calcified. Aortic valve regurgitation is trivial.  Severe aortic valve stenosis.   9. The inferior vena cava is normal in size with greater than 50%  respiratory variability, suggesting right atrial pressure of 3 mmHg.     EKG:    EKG Interpretation Date/Time:  Wednesday August 11 2023 13:31:58 EST Ventricular Rate:  72 PR Interval:    QRS Duration:  68 QT Interval:  380 QTC Calculation: 416 R Axis:   -12  Text Interpretation: Atrial fibrillation with a competing junctional pacemaker Low voltage QRS Septal infarct , age  undetermined When compared with ECG of 10-May-2018 18:01, Atrial fibrillation has replaced Sinus rhythm Confirmed by Lavonta Tillis [phone]) on 08/11/2023 1:40:25 PM      ECG is not changed from 08/20/2022  CTA 08/05/2023  1. Stable size of 3.2 cm saccular abdominal aortic aneurysm status post endovascular aortic repair. Persistent, but significantly decreased type 2 endoleak. 2. No significant change in the size or appearance of the right common iliac artery aneurysm likely arising from a penetrating atherosclerotic ulceration. The aneurysm measures 2.7 x 2.5 cm today. 3. Extensive calcified atherosclerotic plaque. 4. Bilateral mild renal artery stenoses.06/09/2023: Hemoglobin 10.4; Platelets 135  June 16, 2018 Dr. Zachery Dauer: Total cholesterol 142, HDL 48, LDL 76, triglycerides 90 Hemoglobin of 11.0, creatinine 0.96  June 23, 2019 Creatinine 0.95, potassium 4.3, hemoglobin 12.0  08/04/2022 Hemoglobin 10.9, creatinine 0.97, potassium 4.3, ALT 18  Lipid Panel    Component Value Date/Time   CHOL 142 07/18/2020 0930   TRIG 66 07/18/2020 0930   HDL 67 07/18/2020 0930   CHOLHDL 2.1 07/18/2020 0930   CHOLHDL 3.0 01/27/2008 0652   VLDL 10 01/27/2008 0652   LDLCALC 62 07/18/2020 0930   06/23/2022 Cholesterol 149, HDL 66, LDL 62, triglycerides 120  Wt Readings from Last 3 Encounters:  08/11/23 140 lb 12.8 oz (63.9 kg)  07/16/23 145 lb 1.6 oz (65.8 kg)  06/09/23 135 lb (61.2 kg)     1. Permanent atrial fibrillation (HCC)   2. Coronary artery disease involving native coronary artery of native heart with other form of angina pectoris (HCC)   3. Essential hypertension   4.  Aortic valve stenosis, nonrheumatic   5. Pure hypercholesterolemia   6. Status post endovascular aneurysm repair (EVAR)     ASSESSMENT AND PLAN:  1. Afib: Permanent arrhythmia, asymptomatic and well rate controlled.  CHA2DS2-VASc score is at least 3 (age, hypertension, CAD/PAD) .  Compliant with anticoagulation.  Antiarrhythmics do not appear to be indicated, nor is cardioversion. 2. CAD: Asymptomatic/angina free even though he is reasonably active.   Not on aspirin due to full anticoagulation.  On beta-blocker and statin.  Last evaluation was a low risk nuclear stress test in 2014, no revascularization since 2009.  Normal left ventricular ejection fraction by echo.  He will need cardiac catheterization before undergoing aortic valve replacement. 3. HLP: on statin with all lipid parameters in target range. 4. HTN: Well-controlled. 5. AS: Echocardiogram shows worsening aortic stenosis, now with parameters consistent with severe paradoxical low-flow low gradient aortic stenosis.  On physical exam he still has a very distinct second heart sound.  He remains asymptomatic.  Reviewed warning symptoms of exertional angina/exertional dyspnea/exertional syncope.  Recheck echocardiogram in 6 months.  Asked him to call us sooner if he develops exertional angina/dyspnea/syncope.  Briefly reviewed the indications for arctic valve replacement and the different situations when we would prefer surgical AVR versus percutaneous TAVR. 6. AAA s/p EVAR: Aortic diameter stable and his endoleak is less significant.   Patient Instructions  Medication Instructions:  No changes *If you need a refill on your cardiac medications before your next appointment, please call your pharmacy*  Testing/Procedures: Your physician has requested that you have an echocardiogram in January. Echocardiography is a painless test that uses sound waves to create images of your heart. It provides your doctor with information about the size  and shape of your heart and how well your heart's chambers and valves are working. This procedure takes approximately one hour. There are no restrictions for this procedure.  Please do NOT wear cologne, perfume, aftershave, or lotions (deodorant is allowed). Please arrive 15 minutes prior to your appointment time.  Please note: We ask at that you not bring children with you during ultrasound (echo/ vascular) testing. Due to room size and safety concerns, children are not allowed in the ultrasound rooms during exams. Our front office staff cannot provide observation of children in our lobby area while testing is being conducted. An adult accompanying a patient to their appointment will only be allowed in the ultrasound room at the discretion of the ultrasound technician under special circumstances. We apologize for any inconvenience.    Follow-Up: At [facility], you and your health needs are our priority.  As part of our continuing mission to provide you with exceptional heart care, we have created designated Provider Care Teams.  These Care Teams include your primary Cardiologist (physician) and Advanced Practice Providers (APPs -  Physician Assistants and Nurse Practitioners) who all work together to provide you with the care you need, when you need it.  We recommend signing up for the patient portal called "MyChart".  Sign up information is provided on this After Visit Summary.  MyChart is used to connect with patients for Virtual Visits (Telemedicine).  Patients are able to view lab/test results, encounter notes, upcoming appointments, etc.  Non-urgent messages can be sent to your provider as well.   To learn more about what you can do with MyChart, go to [email or web address].    Your next appointment:   6 month(s)  Provider:   Thurmon Fair, MD        Thurmon Fair, MD, [facility] [phone] office [phone] pager

## 2023-08-13 ENCOUNTER — Other Ambulatory Visit (HOSPITAL_COMMUNITY): Payer: Self-pay

## 2023-08-13 ENCOUNTER — Encounter: Payer: Self-pay | Admitting: Vascular Surgery

## 2023-08-13 ENCOUNTER — Ambulatory Visit: Payer: PPO | Admitting: Vascular Surgery

## 2023-08-13 VITALS — BP 176/90 | HR 66 | Temp 98.0°F | Resp 20 | Ht 64.0 in | Wt 143.0 lb

## 2023-08-13 DIAGNOSIS — I723 Aneurysm of iliac artery: Secondary | ICD-10-CM | POA: Diagnosis not present

## 2023-08-13 DIAGNOSIS — I7143 Infrarenal abdominal aortic aneurysm, without rupture: Secondary | ICD-10-CM | POA: Diagnosis not present

## 2023-08-17 ENCOUNTER — Other Ambulatory Visit (HOSPITAL_COMMUNITY): Payer: Self-pay

## 2023-08-17 ENCOUNTER — Other Ambulatory Visit: Payer: Self-pay

## 2023-08-17 DIAGNOSIS — I7143 Infrarenal abdominal aortic aneurysm, without rupture: Secondary | ICD-10-CM

## 2023-08-17 MED ORDER — FLUOCINOLONE ACETONIDE SCALP 0.01 % EX OIL
1.0000 | TOPICAL_OIL | Freq: Every evening | CUTANEOUS | 3 refills | Status: AC | PRN
  Filled 2023-08-17: qty 118.28, 30d supply, fill #0
  Filled 2023-12-22: qty 118.28, 30d supply, fill #1

## 2023-08-23 ENCOUNTER — Ambulatory Visit: Payer: PPO | Admitting: Cardiovascular Disease

## 2023-09-02 ENCOUNTER — Encounter: Payer: Self-pay | Admitting: Hematology and Oncology

## 2023-09-02 ENCOUNTER — Other Ambulatory Visit (HOSPITAL_COMMUNITY): Payer: Self-pay

## 2023-09-02 ENCOUNTER — Other Ambulatory Visit: Payer: Self-pay | Admitting: Family Medicine

## 2023-09-02 ENCOUNTER — Other Ambulatory Visit (HOSPITAL_BASED_OUTPATIENT_CLINIC_OR_DEPARTMENT_OTHER): Payer: Self-pay

## 2023-09-02 ENCOUNTER — Other Ambulatory Visit (HOSPITAL_COMMUNITY): Payer: Self-pay | Admitting: Family Medicine

## 2023-09-02 ENCOUNTER — Other Ambulatory Visit: Payer: Self-pay | Admitting: Cardiovascular Disease

## 2023-09-02 DIAGNOSIS — I4821 Permanent atrial fibrillation: Secondary | ICD-10-CM

## 2023-09-03 ENCOUNTER — Other Ambulatory Visit: Payer: Self-pay | Admitting: Cardiovascular Disease

## 2023-09-03 ENCOUNTER — Other Ambulatory Visit: Payer: Self-pay

## 2023-09-03 ENCOUNTER — Other Ambulatory Visit (HOSPITAL_COMMUNITY): Payer: Self-pay

## 2023-09-03 MED ORDER — BISOPROLOL FUMARATE 10 MG PO TABS
15.0000 mg | ORAL_TABLET | Freq: Every day | ORAL | 3 refills | Status: DC
  Filled 2023-09-03: qty 135, 90d supply, fill #0
  Filled 2023-12-22 (×2): qty 135, 90d supply, fill #1
  Filled 2024-03-14: qty 135, 90d supply, fill #2
  Filled 2024-06-13: qty 135, 90d supply, fill #3

## 2023-09-03 MED ORDER — ATORVASTATIN CALCIUM 20 MG PO TABS
20.0000 mg | ORAL_TABLET | Freq: Every day | ORAL | 3 refills | Status: DC
  Filled 2023-09-03: qty 90, 90d supply, fill #0
  Filled 2023-12-22 (×2): qty 90, 90d supply, fill #1
  Filled 2024-03-14: qty 90, 90d supply, fill #2
  Filled 2024-06-13: qty 90, 90d supply, fill #3

## 2023-09-03 MED ORDER — APIXABAN 5 MG PO TABS
5.0000 mg | ORAL_TABLET | Freq: Two times a day (BID) | ORAL | 1 refills | Status: DC
  Filled 2023-09-03: qty 200, 100d supply, fill #0
  Filled 2023-12-22 (×2): qty 200, 100d supply, fill #1

## 2023-09-03 NOTE — Telephone Encounter (Signed)
 Prescription refill request for Eliquis received. Indication:afib Last office visit:12/24 Scr:1.27  2/24 Age: 78 Weight:64.9  kg  Prescription refilled

## 2023-09-06 ENCOUNTER — Other Ambulatory Visit (HOSPITAL_COMMUNITY): Payer: Self-pay

## 2023-09-06 ENCOUNTER — Other Ambulatory Visit: Payer: Self-pay

## 2023-09-06 MED ORDER — PANTOPRAZOLE SODIUM 40 MG PO TBEC
40.0000 mg | DELAYED_RELEASE_TABLET | Freq: Every day | ORAL | 1 refills | Status: DC
  Filled 2023-09-14: qty 90, 90d supply, fill #0
  Filled 2023-12-22 (×2): qty 90, 90d supply, fill #1

## 2023-09-06 MED ORDER — TAMSULOSIN HCL 0.4 MG PO CAPS
0.4000 mg | ORAL_CAPSULE | Freq: Every day | ORAL | 1 refills | Status: DC
  Filled 2023-09-14: qty 90, 90d supply, fill #0
  Filled 2023-12-22 (×2): qty 90, 90d supply, fill #1

## 2023-09-14 ENCOUNTER — Ambulatory Visit (HOSPITAL_COMMUNITY)
Admission: RE | Admit: 2023-09-14 | Payer: PPO | Source: Ambulatory Visit | Attending: Cardiovascular Disease | Admitting: Cardiovascular Disease

## 2023-09-14 ENCOUNTER — Other Ambulatory Visit: Payer: Self-pay

## 2023-09-17 ENCOUNTER — Other Ambulatory Visit (HOSPITAL_COMMUNITY): Payer: Self-pay

## 2023-09-18 ENCOUNTER — Other Ambulatory Visit (HOSPITAL_COMMUNITY): Payer: Self-pay

## 2023-09-22 ENCOUNTER — Encounter: Payer: Self-pay | Admitting: Hematology and Oncology

## 2023-09-22 ENCOUNTER — Other Ambulatory Visit (HOSPITAL_COMMUNITY): Payer: Self-pay

## 2023-09-23 ENCOUNTER — Other Ambulatory Visit: Payer: Self-pay

## 2023-09-23 ENCOUNTER — Other Ambulatory Visit (HOSPITAL_COMMUNITY): Payer: Self-pay

## 2023-09-23 MED ORDER — DOXYCYCLINE MONOHYDRATE 100 MG PO CAPS
100.0000 mg | ORAL_CAPSULE | Freq: Two times a day (BID) | ORAL | 0 refills | Status: AC
  Filled 2023-09-23 (×2): qty 10, 5d supply, fill #0

## 2023-09-24 ENCOUNTER — Other Ambulatory Visit (HOSPITAL_COMMUNITY): Payer: Self-pay

## 2023-10-05 ENCOUNTER — Other Ambulatory Visit (HOSPITAL_COMMUNITY): Payer: Self-pay

## 2023-10-06 ENCOUNTER — Other Ambulatory Visit: Payer: Self-pay

## 2023-10-06 ENCOUNTER — Encounter: Payer: Self-pay | Admitting: Hematology and Oncology

## 2023-10-06 ENCOUNTER — Other Ambulatory Visit (HOSPITAL_COMMUNITY): Payer: Self-pay

## 2023-10-06 MED ORDER — FLUTICASONE PROPIONATE 50 MCG/ACT NA SUSP
1.0000 | Freq: Two times a day (BID) | NASAL | 1 refills | Status: DC | PRN
  Filled 2023-10-06: qty 16, 30d supply, fill #0
  Filled 2023-12-22: qty 16, 30d supply, fill #1

## 2023-10-06 MED ORDER — OLOPATADINE HCL 0.2 % OP SOLN
1.0000 [drp] | Freq: Every day | OPHTHALMIC | 1 refills | Status: DC | PRN
  Filled 2023-10-06: qty 2.5, 50d supply, fill #0
  Filled 2023-12-22: qty 2.5, 50d supply, fill #1

## 2023-10-14 DIAGNOSIS — R809 Proteinuria, unspecified: Secondary | ICD-10-CM | POA: Diagnosis not present

## 2023-10-15 ENCOUNTER — Ambulatory Visit (HOSPITAL_COMMUNITY)
Admission: RE | Admit: 2023-10-15 | Discharge: 2023-10-15 | Disposition: A | Discharge status: Home / Self Care | Payer: PPO | Source: Ambulatory Visit | Attending: Internal Medicine | Admitting: Internal Medicine

## 2023-10-15 ENCOUNTER — Encounter: Payer: Self-pay | Admitting: Cardiovascular Disease

## 2023-10-15 DIAGNOSIS — I350 Nonrheumatic aortic (valve) stenosis: Secondary | ICD-10-CM | POA: Insufficient documentation

## 2023-10-15 LAB — ECHOCARDIOGRAM COMPLETE
AR max vel: 0.91 cm2
AV Area VTI: 0.86 cm2
AV Area mean vel: 1.04 cm2
AV Mean grad: 21.5 mm[Hg]
AV Peak grad: 39.2 mm[Hg]
AV Vena cont: 0.22 cm
Ao pk vel: 3.13 m/s
MV M vel: 5.07 m/s
MV Peak grad: 102.8 mm[Hg]
P 1/2 time: 485 ms
S' Lateral: 2.78 cm

## 2023-10-19 ENCOUNTER — Telehealth: Payer: Self-pay | Admitting: Cardiovascular Disease

## 2023-10-19 ENCOUNTER — Other Ambulatory Visit: Payer: Self-pay | Admitting: *Deleted

## 2023-10-19 DIAGNOSIS — I350 Nonrheumatic aortic (valve) stenosis: Secondary | ICD-10-CM

## 2023-10-19 NOTE — Telephone Encounter (Signed)
 New Message:      Michelle Nasuti from Dr Mercy Riding office is calling.    Pt c/o medication issue:  1. Name of Medication: Farxiga 10 mg  2. How are you currently taking this medication (dosage and times per day)?   3. Are you having a reaction (difficulty breathing--STAT)?   4. What is your medication issue? Dr Malen Gauze wants to know if Dr C thinks that it will be alright to start patient on Farxiga

## 2023-10-20 NOTE — Telephone Encounter (Signed)
 I think Corey Garcia is a good idea.

## 2023-10-20 NOTE — Telephone Encounter (Signed)
 Attempted to call Corey Garcia, no answer left detailed voicemail and call back number.

## 2023-10-21 ENCOUNTER — Telehealth: Payer: Self-pay | Admitting: Cardiovascular Disease

## 2023-10-21 ENCOUNTER — Other Ambulatory Visit (HOSPITAL_COMMUNITY): Payer: Self-pay

## 2023-10-21 NOTE — Telephone Encounter (Addendum)
   Patient identification verified by 2 forms. Corey Rail, RN    Called and spoke to patient  Relayed provider message  Patient states:   -concerned about side effects of Marcelline Deist   -has a abscess tooth    -needs to have dental extraction in 18 days  -concerned about developing sinus problems after extraction   -has blood in urine, kidney provide is following -would like to know if abscess tooth can cause blood in urine  Informed patient:   -best to follow up with kidney provider regarding blood in urine   -have dental provider sent pre-op clearance form to office [phone])  Patient verbalized understanding, no questions at this time

## 2023-10-21 NOTE — Telephone Encounter (Signed)
 Pt c/o medication issue:  1. Name of Medication: Farxiga   2. How are you currently taking this medication (dosage and times per day)? Mo currently taking  3. Are you having a reaction (difficulty breathing--STAT)? No   4. What is your medication issue? Pt wants doctor to know Kidney doctor is wanting to put him on this and is it safe to take with his heart conditions?

## 2023-10-22 ENCOUNTER — Other Ambulatory Visit: Payer: Self-pay

## 2023-10-22 ENCOUNTER — Other Ambulatory Visit (HOSPITAL_COMMUNITY): Payer: Self-pay

## 2023-10-22 MED ORDER — DAPAGLIFLOZIN PROPANEDIOL 10 MG PO TABS
10.0000 mg | ORAL_TABLET | Freq: Every day | ORAL | 11 refills | Status: DC
  Filled 2023-10-22: qty 30, 30d supply, fill #0
  Filled 2023-11-17: qty 30, 30d supply, fill #1
  Filled 2024-06-13: qty 30, 30d supply, fill #2
  Filled 2024-07-06: qty 30, 30d supply, fill #3
  Filled 2024-08-05: qty 30, 30d supply, fill #4
  Filled 2024-09-04: qty 30, 30d supply, fill #5

## 2023-10-26 ENCOUNTER — Telehealth: Payer: Self-pay | Admitting: *Deleted

## 2023-10-26 NOTE — Telephone Encounter (Signed)
   Patient Name: Corey Garcia  DOB: [birth year] MRN: [id number]  Primary Cardiologist: Thurmon Fair, MD  Chart reviewed as part of pre-operative protocol coverage.   Dental extractions (i.e. 1-2 teeth) are considered low risk procedures per guidelines and generally do not require any specific cardiac clearance. It is also generally accepted that for simple extractions and dental cleanings, there is no need to interrupt blood thinner therapy.   SBE prophylaxis is for the patient from a cardiac standpoint.  Patient should take amoxicillin 500 mg x 4 tablets 30-60 minutes prior to dental procedures.  Please advise if updated prescription is needing to be sent to pharmacy.  I will route this recommendation to the requesting party via Epic fax function and remove from pre-op pool.  Please call with questions.  Napoleon Form, Leodis Rains, NP 10/26/2023, 11:52 AM

## 2023-10-26 NOTE — Telephone Encounter (Signed)
   Pre-operative Risk Assessment    Patient Name: Corey Garcia  DOB: August 23, 1946 MRN: [id number]   Date of last office visit: 08/11/23 DR. CROITORU Date of next office visit: NONE  Request for Surgical Clearance    Procedure:  Dental Extraction - Amount of Teeth to be Pulled:  1 TOOTH SURGICAL EXTRACTION  Date of Surgery:  Clearance TBD                                Surgeon:  DR. Toy Cookey, DDS Surgeon's Group or Practice Name:  [facility]  Phone number:  [phone] Fax number:  [phone]   Type of Clearance Requested:   - Medical  - Pharmacy:  Hold Apixaban (Eliquis)     Type of Anesthesia:  Local  WITH TOPICAL ANESTHESIA    Additional requests/questions:    Elpidio Anis   10/26/2023, 11:45 AM

## 2023-11-08 DIAGNOSIS — R809 Proteinuria, unspecified: Secondary | ICD-10-CM | POA: Diagnosis not present

## 2023-11-09 ENCOUNTER — Other Ambulatory Visit (HOSPITAL_COMMUNITY): Payer: Self-pay

## 2023-11-09 MED ORDER — AMOXICILLIN 500 MG PO CAPS
500.0000 mg | ORAL_CAPSULE | Freq: Three times a day (TID) | ORAL | 0 refills | Status: DC
  Filled 2023-11-09: qty 21, 7d supply, fill #0

## 2023-11-09 MED ORDER — ACETAMINOPHEN-CODEINE 300-30 MG PO TABS
ORAL_TABLET | ORAL | 0 refills | Status: DC
  Filled 2023-11-09: qty 16, 4d supply, fill #0
  Filled 2023-11-09: qty 16, 3d supply, fill #0

## 2023-11-15 ENCOUNTER — Other Ambulatory Visit (HOSPITAL_COMMUNITY): Payer: Self-pay

## 2023-11-15 MED ORDER — DAPAGLIFLOZIN PROPANEDIOL 5 MG PO TABS
5.0000 mg | ORAL_TABLET | Freq: Every day | ORAL | 11 refills | Status: AC
Start: 2023-11-15 — End: ?
  Filled 2023-11-15: qty 30, 30d supply, fill #0
  Filled 2023-12-22 (×2): qty 90, 90d supply, fill #0
  Filled 2024-03-14: qty 90, 90d supply, fill #1
  Filled 2024-06-13: qty 90, 90d supply, fill #2
  Filled 2024-06-14: qty 90, 90d supply, fill #0

## 2023-11-17 ENCOUNTER — Other Ambulatory Visit (HOSPITAL_COMMUNITY): Payer: Self-pay

## 2023-11-17 ENCOUNTER — Other Ambulatory Visit: Payer: Self-pay

## 2023-11-17 MED ORDER — HYDROCORTISONE 2.5 % EX CREA
TOPICAL_CREAM | CUTANEOUS | 0 refills | Status: DC
  Filled 2023-11-17: qty 60, 30d supply, fill #0

## 2023-11-18 ENCOUNTER — Other Ambulatory Visit (HOSPITAL_COMMUNITY): Payer: Self-pay

## 2023-11-22 ENCOUNTER — Other Ambulatory Visit (HOSPITAL_COMMUNITY): Payer: Self-pay

## 2023-11-25 DIAGNOSIS — I10 Essential (primary) hypertension: Secondary | ICD-10-CM | POA: Diagnosis not present

## 2023-11-25 DIAGNOSIS — R0981 Nasal congestion: Secondary | ICD-10-CM | POA: Diagnosis not present

## 2023-11-30 DIAGNOSIS — R809 Proteinuria, unspecified: Secondary | ICD-10-CM | POA: Diagnosis not present

## 2023-12-07 ENCOUNTER — Other Ambulatory Visit (HOSPITAL_COMMUNITY): Payer: Self-pay

## 2023-12-07 DIAGNOSIS — Z6825 Body mass index (BMI) 25.0-25.9, adult: Secondary | ICD-10-CM | POA: Diagnosis not present

## 2023-12-07 DIAGNOSIS — M545 Low back pain, unspecified: Secondary | ICD-10-CM | POA: Diagnosis not present

## 2023-12-07 MED ORDER — TIZANIDINE HCL 4 MG PO TABS
4.0000 mg | ORAL_TABLET | Freq: Every day | ORAL | 0 refills | Status: AC
  Filled 2023-12-07: qty 10, 10d supply, fill #0

## 2023-12-22 ENCOUNTER — Other Ambulatory Visit: Payer: Self-pay

## 2023-12-22 ENCOUNTER — Other Ambulatory Visit: Payer: Self-pay | Admitting: Family Medicine

## 2023-12-22 ENCOUNTER — Encounter: Payer: Self-pay | Admitting: Hematology and Oncology

## 2023-12-22 ENCOUNTER — Other Ambulatory Visit (HOSPITAL_COMMUNITY): Payer: Self-pay

## 2023-12-22 MED ORDER — HYDROCORTISONE 2.5 % EX CREA
TOPICAL_CREAM | CUTANEOUS | 0 refills | Status: DC
  Filled 2023-12-22: qty 60, 30d supply, fill #0

## 2024-01-04 DIAGNOSIS — R809 Proteinuria, unspecified: Secondary | ICD-10-CM | POA: Diagnosis not present

## 2024-01-04 DIAGNOSIS — I714 Abdominal aortic aneurysm, without rupture, unspecified: Secondary | ICD-10-CM | POA: Diagnosis not present

## 2024-01-04 DIAGNOSIS — I10 Essential (primary) hypertension: Secondary | ICD-10-CM | POA: Diagnosis not present

## 2024-01-04 DIAGNOSIS — I4891 Unspecified atrial fibrillation: Secondary | ICD-10-CM | POA: Diagnosis not present

## 2024-01-04 DIAGNOSIS — I2510 Atherosclerotic heart disease of native coronary artery without angina pectoris: Secondary | ICD-10-CM | POA: Diagnosis not present

## 2024-01-05 ENCOUNTER — Other Ambulatory Visit (HOSPITAL_COMMUNITY): Payer: Self-pay

## 2024-01-10 ENCOUNTER — Other Ambulatory Visit (HOSPITAL_COMMUNITY): Payer: Self-pay

## 2024-02-08 ENCOUNTER — Encounter: Payer: Self-pay | Admitting: Cardiovascular Disease

## 2024-02-18 DIAGNOSIS — I4891 Unspecified atrial fibrillation: Secondary | ICD-10-CM | POA: Diagnosis not present

## 2024-02-18 DIAGNOSIS — I10 Essential (primary) hypertension: Secondary | ICD-10-CM | POA: Diagnosis not present

## 2024-02-18 DIAGNOSIS — I2510 Atherosclerotic heart disease of native coronary artery without angina pectoris: Secondary | ICD-10-CM | POA: Diagnosis not present

## 2024-02-23 ENCOUNTER — Other Ambulatory Visit: Payer: Self-pay

## 2024-02-23 ENCOUNTER — Other Ambulatory Visit (HOSPITAL_COMMUNITY): Payer: Self-pay

## 2024-02-23 ENCOUNTER — Encounter: Payer: Self-pay | Admitting: Hematology and Oncology

## 2024-02-23 MED ORDER — FLUTICASONE PROPIONATE 50 MCG/ACT NA SUSP
1.0000 | Freq: Two times a day (BID) | NASAL | 5 refills | Status: AC
  Filled 2024-02-23: qty 16, 30d supply, fill #0
  Filled 2024-04-14: qty 16, 30d supply, fill #1
  Filled 2024-06-16: qty 16, 30d supply, fill #2
  Filled 2024-07-16: qty 16, 30d supply, fill #3
  Filled 2024-08-15: qty 16, 30d supply, fill #4
  Filled 2024-09-10: qty 16, 30d supply, fill #5

## 2024-02-23 MED ORDER — OLOPATADINE HCL 0.2 % OP SOLN
1.0000 [drp] | Freq: Every day | OPHTHALMIC | 1 refills | Status: DC
  Filled 2024-02-23: qty 2.5, 50d supply, fill #0
  Filled 2024-04-14: qty 2.5, 50d supply, fill #1

## 2024-03-14 ENCOUNTER — Other Ambulatory Visit: Payer: Self-pay

## 2024-03-14 ENCOUNTER — Other Ambulatory Visit (HOSPITAL_COMMUNITY): Payer: Self-pay

## 2024-03-14 ENCOUNTER — Other Ambulatory Visit: Payer: Self-pay | Admitting: Cardiovascular Disease

## 2024-03-14 DIAGNOSIS — I4821 Permanent atrial fibrillation: Secondary | ICD-10-CM

## 2024-03-14 MED ORDER — APIXABAN 5 MG PO TABS
5.0000 mg | ORAL_TABLET | Freq: Two times a day (BID) | ORAL | 1 refills | Status: AC
  Filled 2024-03-14: qty 200, 100d supply, fill #0
  Filled 2024-07-02: qty 200, 100d supply, fill #1

## 2024-03-14 NOTE — Telephone Encounter (Signed)
 Prescription refill request for Eliquis  received. Indication: 08/11/23 (Croitoru)  Last office visit: 64.9kg Scr: 1.25 (11/30/23 via [facility])  Age: 78 Weight: 64.9kg  Appropriate dose. Refill sent.

## 2024-03-15 ENCOUNTER — Other Ambulatory Visit (HOSPITAL_COMMUNITY): Payer: Self-pay

## 2024-03-15 ENCOUNTER — Encounter: Payer: Self-pay | Admitting: Hematology and Oncology

## 2024-03-15 ENCOUNTER — Other Ambulatory Visit: Payer: Self-pay

## 2024-03-15 MED ORDER — TAMSULOSIN HCL 0.4 MG PO CAPS
0.4000 mg | ORAL_CAPSULE | Freq: Every day | ORAL | 1 refills | Status: DC
  Filled 2024-03-15: qty 90, 90d supply, fill #0
  Filled 2024-06-13: qty 90, 90d supply, fill #1

## 2024-03-15 MED ORDER — PANTOPRAZOLE SODIUM 40 MG PO TBEC
40.0000 mg | DELAYED_RELEASE_TABLET | Freq: Every day | ORAL | 1 refills | Status: DC
  Filled 2024-03-15: qty 90, 90d supply, fill #0
  Filled 2024-06-13: qty 90, 90d supply, fill #1

## 2024-03-15 MED ORDER — VITAMIN D3 25 MCG (1000 UNIT) PO TABS
1000.0000 [IU] | ORAL_TABLET | Freq: Every day | ORAL | 1 refills | Status: AC
  Filled 2024-03-15: qty 100, 100d supply, fill #0
  Filled 2024-06-16: qty 100, 100d supply, fill #1

## 2024-03-15 MED ORDER — HYDROCORTISONE 2.5 % EX CREA
1.0000 | TOPICAL_CREAM | Freq: Two times a day (BID) | CUTANEOUS | 0 refills | Status: DC | PRN
  Filled 2024-03-15: qty 60, 20d supply, fill #0

## 2024-03-18 DIAGNOSIS — I2510 Atherosclerotic heart disease of native coronary artery without angina pectoris: Secondary | ICD-10-CM | POA: Diagnosis not present

## 2024-03-18 DIAGNOSIS — I4891 Unspecified atrial fibrillation: Secondary | ICD-10-CM | POA: Diagnosis not present

## 2024-03-18 DIAGNOSIS — I10 Essential (primary) hypertension: Secondary | ICD-10-CM | POA: Diagnosis not present

## 2024-03-30 DIAGNOSIS — E7800 Pure hypercholesterolemia, unspecified: Secondary | ICD-10-CM | POA: Diagnosis not present

## 2024-03-30 DIAGNOSIS — I10 Essential (primary) hypertension: Secondary | ICD-10-CM | POA: Diagnosis not present

## 2024-03-30 DIAGNOSIS — I2510 Atherosclerotic heart disease of native coronary artery without angina pectoris: Secondary | ICD-10-CM | POA: Diagnosis not present

## 2024-03-30 DIAGNOSIS — I4891 Unspecified atrial fibrillation: Secondary | ICD-10-CM | POA: Diagnosis not present

## 2024-03-30 DIAGNOSIS — N400 Enlarged prostate without lower urinary tract symptoms: Secondary | ICD-10-CM | POA: Diagnosis not present

## 2024-04-03 DIAGNOSIS — H40013 Open angle with borderline findings, low risk, bilateral: Secondary | ICD-10-CM | POA: Diagnosis not present

## 2024-04-03 DIAGNOSIS — H11042 Peripheral pterygium, stationary, left eye: Secondary | ICD-10-CM | POA: Diagnosis not present

## 2024-04-03 DIAGNOSIS — H04123 Dry eye syndrome of bilateral lacrimal glands: Secondary | ICD-10-CM | POA: Diagnosis not present

## 2024-04-11 DIAGNOSIS — R809 Proteinuria, unspecified: Secondary | ICD-10-CM | POA: Diagnosis not present

## 2024-04-11 DIAGNOSIS — I4891 Unspecified atrial fibrillation: Secondary | ICD-10-CM | POA: Diagnosis not present

## 2024-04-11 DIAGNOSIS — I5032 Chronic diastolic (congestive) heart failure: Secondary | ICD-10-CM | POA: Diagnosis not present

## 2024-04-14 ENCOUNTER — Encounter: Payer: Self-pay | Admitting: Hematology and Oncology

## 2024-04-14 ENCOUNTER — Other Ambulatory Visit (HOSPITAL_COMMUNITY): Payer: Self-pay

## 2024-04-14 DIAGNOSIS — M064 Inflammatory polyarthropathy: Secondary | ICD-10-CM | POA: Diagnosis not present

## 2024-04-14 DIAGNOSIS — M542 Cervicalgia: Secondary | ICD-10-CM | POA: Diagnosis not present

## 2024-04-17 DIAGNOSIS — I129 Hypertensive chronic kidney disease with stage 1 through stage 4 chronic kidney disease, or unspecified chronic kidney disease: Secondary | ICD-10-CM | POA: Diagnosis not present

## 2024-04-17 DIAGNOSIS — I4891 Unspecified atrial fibrillation: Secondary | ICD-10-CM | POA: Diagnosis not present

## 2024-04-17 DIAGNOSIS — I2510 Atherosclerotic heart disease of native coronary artery without angina pectoris: Secondary | ICD-10-CM | POA: Diagnosis not present

## 2024-04-17 DIAGNOSIS — I5032 Chronic diastolic (congestive) heart failure: Secondary | ICD-10-CM | POA: Diagnosis not present

## 2024-04-17 DIAGNOSIS — N1831 Chronic kidney disease, stage 3a: Secondary | ICD-10-CM | POA: Diagnosis not present

## 2024-04-17 DIAGNOSIS — I10 Essential (primary) hypertension: Secondary | ICD-10-CM | POA: Diagnosis not present

## 2024-04-17 DIAGNOSIS — E871 Hypo-osmolality and hyponatremia: Secondary | ICD-10-CM | POA: Diagnosis not present

## 2024-04-17 DIAGNOSIS — R809 Proteinuria, unspecified: Secondary | ICD-10-CM | POA: Diagnosis not present

## 2024-04-18 ENCOUNTER — Ambulatory Visit (HOSPITAL_COMMUNITY)
Admission: RE | Admit: 2024-04-18 | Discharge: 2024-04-18 | Disposition: A | Discharge status: Home / Self Care | Source: Ambulatory Visit | Attending: Internal Medicine | Admitting: Internal Medicine

## 2024-04-18 ENCOUNTER — Ambulatory Visit: Payer: Self-pay | Admitting: Cardiovascular Disease

## 2024-04-18 DIAGNOSIS — I350 Nonrheumatic aortic (valve) stenosis: Secondary | ICD-10-CM | POA: Diagnosis not present

## 2024-04-18 LAB — ECHOCARDIOGRAM COMPLETE
AR max vel: 0.49 cm2
AV Area VTI: 0.46 cm2
AV Area mean vel: 0.44 cm2
AV Mean grad: 19.8 mmHg
AV Peak grad: 31.6 mmHg
Ao pk vel: 2.81 m/s
MV M vel: 5.31 m/s
MV Peak grad: 112.6 mmHg
P 1/2 time: 506 ms
S' Lateral: 2.5 cm

## 2024-04-24 NOTE — Telephone Encounter (Signed)
 The echo again shows findings consistent with severe aortic stenosis (paradoxical low flow low gradient). If Corey Garcia has any complaints of dizziness or loss of consciousness with exertion, chest tightness or shortness of breath with physical activity, we need to discuss TAVR (stent-valve placement via catheter). Please make sure he has an appointment with me in early December (sooner if symptomatic)  Gave pt results over the phone, mailing results, and appt scheduled for 08/04/24 at 11:00.

## 2024-04-26 ENCOUNTER — Other Ambulatory Visit (HOSPITAL_COMMUNITY): Payer: Self-pay

## 2024-04-26 ENCOUNTER — Other Ambulatory Visit: Payer: Self-pay

## 2024-04-26 DIAGNOSIS — M4722 Other spondylosis with radiculopathy, cervical region: Secondary | ICD-10-CM | POA: Diagnosis not present

## 2024-04-26 DIAGNOSIS — M5011 Cervical disc disorder with radiculopathy,  high cervical region: Secondary | ICD-10-CM | POA: Diagnosis not present

## 2024-04-26 DIAGNOSIS — M5412 Radiculopathy, cervical region: Secondary | ICD-10-CM | POA: Diagnosis not present

## 2024-04-26 DIAGNOSIS — M4802 Spinal stenosis, cervical region: Secondary | ICD-10-CM | POA: Diagnosis not present

## 2024-04-26 MED ORDER — PREDNISONE 20 MG PO TABS
40.0000 mg | ORAL_TABLET | Freq: Every day | ORAL | 0 refills | Status: DC
  Filled 2024-04-26 (×2): qty 10, 5d supply, fill #0

## 2024-04-27 ENCOUNTER — Other Ambulatory Visit (HOSPITAL_COMMUNITY): Payer: Self-pay

## 2024-04-30 DIAGNOSIS — I10 Essential (primary) hypertension: Secondary | ICD-10-CM | POA: Diagnosis not present

## 2024-04-30 DIAGNOSIS — I2510 Atherosclerotic heart disease of native coronary artery without angina pectoris: Secondary | ICD-10-CM | POA: Diagnosis not present

## 2024-04-30 DIAGNOSIS — E7800 Pure hypercholesterolemia, unspecified: Secondary | ICD-10-CM | POA: Diagnosis not present

## 2024-04-30 DIAGNOSIS — I4891 Unspecified atrial fibrillation: Secondary | ICD-10-CM | POA: Diagnosis not present

## 2024-04-30 DIAGNOSIS — N400 Enlarged prostate without lower urinary tract symptoms: Secondary | ICD-10-CM | POA: Diagnosis not present

## 2024-05-03 DIAGNOSIS — M5412 Radiculopathy, cervical region: Secondary | ICD-10-CM | POA: Diagnosis not present

## 2024-05-09 ENCOUNTER — Telehealth: Payer: Self-pay | Admitting: Cardiovascular Disease

## 2024-05-09 MED ORDER — FUROSEMIDE 20 MG PO TABS: 20.0000 mg | ORAL_TABLET | Freq: Every day | ORAL | 2 refills | Status: DC

## 2024-05-09 NOTE — Telephone Encounter (Signed)
 Pt stating he has been experiencing some tiredness, leg weakness and change in gait when going for his daily walks. This is new for him and he is concerned. Please advise.

## 2024-05-09 NOTE — Telephone Encounter (Signed)
 It sounds like CHF due to the aortic valve stenosis. Please make him an appt first available in the office w me or APP or if necessary, DOD. Start furosemide  20 mg daily.

## 2024-05-09 NOTE — Telephone Encounter (Signed)
 Shared recommendation from Dr. Francyne with patient:  It sounds like CHF due to the aortic valve stenosis. Please make him an appt first available in the office w me or APP or if necessary, DOD. Start furosemide  20 mg daily.      Rx for furosemide  20 mg daily sent to [facility] pharmacy.  Appt scheduled with APP for 9/11.  Patient verbalized understanding of the above and expressed appreciation for call.

## 2024-05-09 NOTE — Telephone Encounter (Signed)
 Patient reports since his echo on 04/18/24 he has gained 10 lbs which he states is very unusual for him. He also has swelling in his feet and lower legs.  He states since that time he has also had tiredness, leg weakness and he is having to walk more slowly.  He denies any CP or SOB, denies leg pain.  Will forward to Dr. Francyne to review and advise.

## 2024-05-10 NOTE — Progress Notes (Signed)
 " Cardiology Office Note    Patient Name: Corey Garcia Date of Encounter: 05/11/2024  Primary Care Provider:  Dayna Motto, DO Primary Cardiologist:  Jerel Balding, MD Primary Electrophysiologist: None   Past Medical History    Past Medical History:  Diagnosis Date   Alcoholism (HCC) 03/15/2014   Anemia    Arthritis    all over, some ra   Bradycardia by electrocardiogram    BB decreased 11/12/11   CAD (coronary artery disease)    last cath=01/2008, PCI   CHF (congestive heart failure) (HCC)    Dyslipidemia    ED (erectile dysfunction)    viagra helps   GERD (gastroesophageal reflux disease)    HTN (hypertension)    Iron overload 03/15/2014   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) 03/15/2014   pt .denies at preop   Murmur, cardiac    faint early systolic grade 1/6 aortic ejection murmur   Myocardial infarction [facility])    Peripheral vascular disease (HCC)    Rib pain 04/16/2014   SCCA (squamous cell carcinoma) of skin 12/19/2020   Left Malar Cheek Lateral (well diff) (tx p bx)   SCCA (squamous cell carcinoma) of skin 12/19/2020   Right Scalp Posterior (in situ) (tx p bx)   SCCA (squamous cell carcinoma) of skin 12/19/2020   Right Scalp Anterior (in situ) (tx p bx)   Skin cancer    basal cell ca X 4    History of Present Illness  Corey Garcia is a 78 y.o. male with a PMH of CAD with STEMI in 2009 treated with DES and staged arthrectomy of OM 2 and LAD HLD, HTN hemochromatosis, AAA s/p EVAR 2019, atrial fibrillation who presents today for complaint of heart palpitations.  Corey Garcia has a cardiac history dating back to 2009 when he was diagnosed with a STEMI and went LHC percent LAD and 80% - 90% OM with 50% RCA.  He underwent PCI to the LAD and OM.  He underwent a Myoview in 2014 that was low risk.  He is currently followed by Dr. Balding and was found to be in AF with RVR in 2021 and was started on Eliquis .  Pleated a 2D echo that showed worsening aortic stenosis with  stable EF.  He was last seen by Dr. Fayette on 08/11/2023 for follow-up.  During his visit he reported no new physical complaints and was walking 1 to 2 miles every day.  Completed a CTA for surveillance of AAA that showed stable saccular aneurysm at 3.2 cm.  Advised regarding exertional angina, syncope as a worsening symptom of aortic stenosis and was informed to call us  if he develops any of those symptoms.  A 2D echo on 04/18/2024 that showed severe aortic stenosis EF of 60 to 65% and mild concentric LVH with mild elevated PASP of 40.5 and mean aortic gradient of 19.8 m mercury.  He reached out to our office on 05/09/2024 with complaint of increased tiredness with leg weakness and change in gait when walking.  He was advised to start Lasix  20 mg daily and scheduled for follow-up visit.  Corey Garcia presents today for follow-up of shortness of breath and lower extremity swelling. He has been experiencing shortness of breath and swelling, which have prompted him to seek medical attention He describes waking up with tingling in his neck, soreness in his collarbone, shoulder, tricep, and a burning sensation in his right elbow. He also experienced tingling in his wrist and right foot. These symptoms  persisted and affected his sleep for five days. After consulting his primary care physician, he was prescribed prednisone  and methocarbamol , which provided some relief. An orthopedic evaluation, including x-rays, diagnosed him with degenerative disc disease in his neck. He started taking Lasix  (furosemide ) on May 10, 2024, to address swelling and shortness of breath. He reports losing about four pounds since starting the medication and notes improvement in his breathing, although it is not yet back to normal. He mentions experiencing swelling in his ankles and legs, which was more prominent before starting Lasix . He also developed a rash, which he suspects might be related to tight socks or the medication. He recalls a  recent attempt to start a walking routine, where he walked for thirty minutes without experiencing shortness of breath, chest pain, or significant fatigue. However, he noticed taking smaller steps and being cautious to avoid falling. He reports increased tiredness, leg weakness, and changes in gait, which led to the initiation of Lasix  treatment. His past medical history includes aortic valve disease, a history of heart stents, a triple A surgery, and a left knee replacement. He has not experienced significant swelling in his legs prior to this episode. He also mentions a recent severe sinus infection and an infected tooth, which may have contributed to his symptoms. He is currently taking Lasix  20 mg once daily. He was previously on prednisone  and methocarbamol  but has since discontinued these medications. He also mentions taking a generic form of simethicone  for gas relief. Patient denies chest pain, palpitations, dyspnea, PND, orthopnea, nausea, vomiting, dizziness, syncope, edema, weight gain, or early satiety.  Discussed the use of AI scribe software for clinical note transcription with the patient, who gave verbal consent to proceed.  History of Present Illness   Review of Systems  Please see the history of present illness.    All other systems reviewed and are otherwise negative except as noted above.  Physical Exam    Wt Readings from Last 3 Encounters:  05/11/24 144 lb 3.2 oz (65.4 kg)  08/13/23 143 lb (64.9 kg)  08/11/23 140 lb 12.8 oz (63.9 kg)   VS: Vitals:   05/11/24 1326  BP: (!) 104/58  Pulse: 69  SpO2: 98%  ,Body mass index is 24.75 kg/m. GEN: Well nourished, well developed in no acute distress Neck: No JVD; No carotid bruits Pulmonary: Clear to auscultation without rales, wheezing or rhonchi  Cardiovascular: Normal rate. Regular rhythm. Normal S1. Normal S2.   Murmurs: 3/6 systolic murmur ABDOMEN: Soft, non-tender, non-distended EXTREMITIES: Bilateral +1 lower  extremity edema  EKG/LABS/ Recent Cardiac Studies   ECG personally reviewed by me today -atrial fibrillation with rate of 69 bpm and no acute changes consistent with previous EKG.  Risk Assessment/Calculations:    CHA2DS2-VASc Score = 4   This indicates a 4.8% annual risk of stroke. The patient's score is based upon: CHF History: 0 HTN History: 1 Diabetes History: 0 Stroke History: 0 Vascular Disease History: 1 Age Score: 2 Gender Score: 0         Lab Results  Component Value Date   WBC 4.8 06/09/2023   HGB 10.4 (L) 06/09/2023   HCT 31.3 (L) 06/09/2023   MCV 111.8 (H) 06/09/2023   PLT 135 (L) 06/09/2023   Lab Results  Component Value Date   CREATININE 0.97 08/04/2022   BUN 9 08/04/2022   NA 137 08/04/2022   K 4.3 08/04/2022   CL 103 08/04/2022   CO2 29 08/04/2022   Lab Results  Component Value Date   CHOL 142 07/18/2020   HDL 67 07/18/2020   LDLCALC 62 07/18/2020   TRIG 66 07/18/2020   CHOLHDL 2.1 07/18/2020    No results found for: HGBA1C Assessment & Plan    Assessment & Plan   1.  Permanent atrial fibrillation: - Rate controlled by EKG today with no acute changes. - Continue Eliquis  5 mg twice daily, Zebeta  15 mg  2.  History of CAD: -s/p STEMI in 2009 treated with DES and staged arthrectomy of OM 2 and LAD - Today patient reports no chest pain or angina since previous follow-up. - Continue current GDMT with Lipitor 20 mg, Zebeta  15 mg  3.  Essential hypertension: - Patient's blood pressure today is stable at 104/58 - Continue Zebeta  50 mg daily  4.  Nonrheumatic AS: -Awaiting evaluation for potential TAVR or other interventions. -Patient was euvolemic on exam with no shortness of breath but noted bilateral +2 pitting edema - Increase Lasix  to 20 mg twice daily, morning and midday. - Prescribe potassium supplement, 20 mEq daily. - Schedule lab work in one week to check renal function and potassium levels. - Advise on low-sodium diet and  adequate hydration (64 ounces daily). - Monitor for symptoms of worsening heart failure, including significant weight gain, increased dyspnea, chest pain, or dizziness. - Continue daily weight monitoring.  5.AAA s/p EVAR:  -Aortic diameter stable and his endoleak is less significant.  - Continue GDMT with Lipitor 20 mg and Zebeta  50 mg daily  Disposition: Follow-up with Jerel Balding, MD as scheduled    Signed, Wyn Raddle, Jackee Shove, NP 05/11/2024, 2:22 PM [facility] "

## 2024-05-11 ENCOUNTER — Ambulatory Visit: Attending: Nurse Practitioner | Admitting: Nurse Practitioner

## 2024-05-11 ENCOUNTER — Other Ambulatory Visit: Payer: Self-pay

## 2024-05-11 ENCOUNTER — Encounter: Payer: Self-pay | Admitting: Nurse Practitioner

## 2024-05-11 VITALS — BP 104/58 | HR 69 | Ht 64.0 in | Wt 144.2 lb

## 2024-05-11 DIAGNOSIS — I25118 Atherosclerotic heart disease of native coronary artery with other forms of angina pectoris: Secondary | ICD-10-CM | POA: Diagnosis not present

## 2024-05-11 DIAGNOSIS — I10 Essential (primary) hypertension: Secondary | ICD-10-CM | POA: Diagnosis not present

## 2024-05-11 DIAGNOSIS — Z8679 Personal history of other diseases of the circulatory system: Secondary | ICD-10-CM | POA: Diagnosis not present

## 2024-05-11 DIAGNOSIS — I4821 Permanent atrial fibrillation: Secondary | ICD-10-CM | POA: Diagnosis not present

## 2024-05-11 DIAGNOSIS — I350 Nonrheumatic aortic (valve) stenosis: Secondary | ICD-10-CM

## 2024-05-11 DIAGNOSIS — Z9889 Other specified postprocedural states: Secondary | ICD-10-CM

## 2024-05-11 MED ORDER — FUROSEMIDE 20 MG PO TABS: 20.0000 mg | ORAL_TABLET | Freq: Two times a day (BID) | ORAL | 1 refills | Status: DC

## 2024-05-11 MED ORDER — POTASSIUM CHLORIDE CRYS ER 20 MEQ PO TBCR: 20.0000 meq | EXTENDED_RELEASE_TABLET | Freq: Every day | ORAL | 1 refills | Status: DC

## 2024-05-11 NOTE — Patient Instructions (Addendum)
 Medication Instructions:  START Potassium 20meq Take 1 tablet once a day  CONTINUE Lasix  20mg  Take 1 tablet twice a day  *If you need a refill on your cardiac medications before your next appointment, please call your pharmacy*  Lab Work: BMET in 1 (ONE) WEEK (05/17/2024) If you have labs (blood work) drawn today and your tests are completely normal, you will receive your results only by: MyChart Message (if you have MyChart) OR A paper copy in the mail If you have any lab test that is abnormal or we need to change your treatment, we will call you to review the results.  Testing/Procedures: NONE ORDERED  Follow-Up: At [facility], you and your health needs are our priority.  As part of our continuing mission to provide you with exceptional heart care, our providers are all part of one team.  This team includes your primary Cardiologist (physician) and Advanced Practice Providers or APPs (Physician Assistants and Nurse Practitioners) who all work together to provide you with the care you need, when you need it.  Your next appointment:   FOLLOW UP AS SCHEDULED   Provider:   Jerel Balding, MD    We recommend signing up for the patient portal called MyChart.  Sign up information is provided on this After Visit Summary.  MyChart is used to connect with patients for Virtual Visits (Telemedicine).  Patients are able to view lab/test results, encounter notes, upcoming appointments, etc.  Non-urgent messages can be sent to your provider as well.   To learn more about what you can do with MyChart, go to [email or web address].   Other Instructions

## 2024-05-17 DIAGNOSIS — I25118 Atherosclerotic heart disease of native coronary artery with other forms of angina pectoris: Secondary | ICD-10-CM | POA: Diagnosis not present

## 2024-05-17 DIAGNOSIS — I4891 Unspecified atrial fibrillation: Secondary | ICD-10-CM | POA: Diagnosis not present

## 2024-05-17 DIAGNOSIS — I4821 Permanent atrial fibrillation: Secondary | ICD-10-CM | POA: Diagnosis not present

## 2024-05-17 DIAGNOSIS — Z8679 Personal history of other diseases of the circulatory system: Secondary | ICD-10-CM | POA: Diagnosis not present

## 2024-05-17 DIAGNOSIS — I350 Nonrheumatic aortic (valve) stenosis: Secondary | ICD-10-CM | POA: Diagnosis not present

## 2024-05-17 DIAGNOSIS — R21 Rash and other nonspecific skin eruption: Secondary | ICD-10-CM | POA: Diagnosis not present

## 2024-05-17 DIAGNOSIS — I2510 Atherosclerotic heart disease of native coronary artery without angina pectoris: Secondary | ICD-10-CM | POA: Diagnosis not present

## 2024-05-17 DIAGNOSIS — R600 Localized edema: Secondary | ICD-10-CM | POA: Diagnosis not present

## 2024-05-17 DIAGNOSIS — I10 Essential (primary) hypertension: Secondary | ICD-10-CM | POA: Diagnosis not present

## 2024-05-17 DIAGNOSIS — Z9889 Other specified postprocedural states: Secondary | ICD-10-CM | POA: Diagnosis not present

## 2024-05-18 ENCOUNTER — Ambulatory Visit: Payer: Self-pay | Admitting: Nurse Practitioner

## 2024-05-18 DIAGNOSIS — Z8679 Personal history of other diseases of the circulatory system: Secondary | ICD-10-CM

## 2024-05-18 DIAGNOSIS — E7800 Pure hypercholesterolemia, unspecified: Secondary | ICD-10-CM

## 2024-05-18 DIAGNOSIS — Z9889 Other specified postprocedural states: Secondary | ICD-10-CM

## 2024-05-18 DIAGNOSIS — I7143 Infrarenal abdominal aortic aneurysm, without rupture: Secondary | ICD-10-CM

## 2024-05-18 DIAGNOSIS — I10 Essential (primary) hypertension: Secondary | ICD-10-CM

## 2024-05-18 DIAGNOSIS — I4821 Permanent atrial fibrillation: Secondary | ICD-10-CM

## 2024-05-18 DIAGNOSIS — I350 Nonrheumatic aortic (valve) stenosis: Secondary | ICD-10-CM

## 2024-05-18 DIAGNOSIS — I25118 Atherosclerotic heart disease of native coronary artery with other forms of angina pectoris: Secondary | ICD-10-CM

## 2024-05-18 LAB — BASIC METABOLIC PANEL WITH GFR
BUN/Creatinine Ratio: 18 (ref 10–24)
BUN: 23 mg/dL (ref 8–27)
CO2: 24 mmol/L (ref 20–29)
Calcium: 9.1 mg/dL (ref 8.6–10.2)
Chloride: 90 mmol/L — ABNORMAL LOW (ref 96–106)
Creatinine, Ser: 1.3 mg/dL — ABNORMAL HIGH (ref 0.76–1.27)
Glucose: 138 mg/dL — ABNORMAL HIGH (ref 70–99)
Potassium: 3.3 mmol/L — ABNORMAL LOW (ref 3.5–5.2)
Sodium: 130 mmol/L — ABNORMAL LOW (ref 134–144)
eGFR: 57 mL/min/1.73 — ABNORMAL LOW (ref >59)

## 2024-05-19 ENCOUNTER — Other Ambulatory Visit: Payer: Self-pay

## 2024-05-19 DIAGNOSIS — E7800 Pure hypercholesterolemia, unspecified: Secondary | ICD-10-CM

## 2024-05-19 DIAGNOSIS — I350 Nonrheumatic aortic (valve) stenosis: Secondary | ICD-10-CM

## 2024-05-19 DIAGNOSIS — I10 Essential (primary) hypertension: Secondary | ICD-10-CM

## 2024-05-19 DIAGNOSIS — Z9889 Other specified postprocedural states: Secondary | ICD-10-CM

## 2024-05-19 DIAGNOSIS — Z8679 Personal history of other diseases of the circulatory system: Secondary | ICD-10-CM

## 2024-05-19 DIAGNOSIS — I4821 Permanent atrial fibrillation: Secondary | ICD-10-CM

## 2024-05-19 DIAGNOSIS — I7143 Infrarenal abdominal aortic aneurysm, without rupture: Secondary | ICD-10-CM

## 2024-05-19 DIAGNOSIS — I25118 Atherosclerotic heart disease of native coronary artery with other forms of angina pectoris: Secondary | ICD-10-CM

## 2024-05-19 MED ORDER — FUROSEMIDE 20 MG PO TABS: 20.0000 mg | ORAL_TABLET | Freq: Every day | ORAL | Status: DC

## 2024-05-19 NOTE — Telephone Encounter (Signed)
 Pt returning call to a nurse needs clarification on medication changes

## 2024-05-19 NOTE — Addendum Note (Signed)
 Addended by: SEBASTIAN JANESE GRADE on: 05/19/2024 05:28 PM   Modules accepted: Orders

## 2024-05-24 ENCOUNTER — Other Ambulatory Visit (HOSPITAL_COMMUNITY): Payer: Self-pay

## 2024-05-24 ENCOUNTER — Encounter: Payer: Self-pay | Admitting: Hematology and Oncology

## 2024-05-24 ENCOUNTER — Other Ambulatory Visit: Payer: Self-pay

## 2024-05-24 DIAGNOSIS — B9789 Other viral agents as the cause of diseases classified elsewhere: Secondary | ICD-10-CM | POA: Diagnosis not present

## 2024-05-24 DIAGNOSIS — J302 Other seasonal allergic rhinitis: Secondary | ICD-10-CM | POA: Diagnosis not present

## 2024-05-24 DIAGNOSIS — H1013 Acute atopic conjunctivitis, bilateral: Secondary | ICD-10-CM | POA: Diagnosis not present

## 2024-05-24 DIAGNOSIS — Z03818 Encounter for observation for suspected exposure to other biological agents ruled out: Secondary | ICD-10-CM | POA: Diagnosis not present

## 2024-05-24 DIAGNOSIS — R051 Acute cough: Secondary | ICD-10-CM | POA: Diagnosis not present

## 2024-05-24 DIAGNOSIS — R519 Headache, unspecified: Secondary | ICD-10-CM | POA: Diagnosis not present

## 2024-05-24 MED ORDER — FLUTICASONE PROPIONATE 50 MCG/ACT NA SUSP
1.0000 | Freq: Two times a day (BID) | NASAL | 0 refills | Status: AC
  Filled 2024-05-24 (×2): qty 16, 30d supply, fill #0

## 2024-05-24 MED ORDER — OLOPATADINE HCL 0.2 % OP SOLN
1.0000 [drp] | Freq: Every day | OPHTHALMIC | 0 refills | Status: AC
  Filled 2024-05-24: qty 2.5, 50d supply, fill #0
  Filled 2024-05-24: qty 2.5, 25d supply, fill #0

## 2024-05-26 DIAGNOSIS — Z8679 Personal history of other diseases of the circulatory system: Secondary | ICD-10-CM | POA: Diagnosis not present

## 2024-05-26 DIAGNOSIS — I10 Essential (primary) hypertension: Secondary | ICD-10-CM | POA: Diagnosis not present

## 2024-05-26 DIAGNOSIS — I7143 Infrarenal abdominal aortic aneurysm, without rupture: Secondary | ICD-10-CM | POA: Diagnosis not present

## 2024-05-26 DIAGNOSIS — I350 Nonrheumatic aortic (valve) stenosis: Secondary | ICD-10-CM | POA: Diagnosis not present

## 2024-05-26 DIAGNOSIS — Z9889 Other specified postprocedural states: Secondary | ICD-10-CM | POA: Diagnosis not present

## 2024-05-26 DIAGNOSIS — I4821 Permanent atrial fibrillation: Secondary | ICD-10-CM | POA: Diagnosis not present

## 2024-05-26 DIAGNOSIS — E7800 Pure hypercholesterolemia, unspecified: Secondary | ICD-10-CM | POA: Diagnosis not present

## 2024-05-26 DIAGNOSIS — I25118 Atherosclerotic heart disease of native coronary artery with other forms of angina pectoris: Secondary | ICD-10-CM | POA: Diagnosis not present

## 2024-05-27 LAB — BASIC METABOLIC PANEL WITH GFR
BUN/Creatinine Ratio: 21 (ref 10–24)
BUN: 24 mg/dL (ref 8–27)
CO2: 22 mmol/L (ref 20–29)
Calcium: 9.6 mg/dL (ref 8.6–10.2)
Chloride: 91 mmol/L — ABNORMAL LOW (ref 96–106)
Creatinine, Ser: 1.17 mg/dL (ref 0.76–1.27)
Glucose: 86 mg/dL (ref 70–99)
Potassium: 4.6 mmol/L (ref 3.5–5.2)
Sodium: 133 mmol/L — ABNORMAL LOW (ref 134–144)
eGFR: 64 mL/min/1.73 (ref >59)

## 2024-05-28 ENCOUNTER — Ambulatory Visit: Payer: Self-pay | Admitting: Nurse Practitioner

## 2024-05-29 ENCOUNTER — Other Ambulatory Visit (HOSPITAL_COMMUNITY): Payer: Self-pay

## 2024-05-30 ENCOUNTER — Other Ambulatory Visit (HOSPITAL_COMMUNITY): Payer: Self-pay

## 2024-05-30 DIAGNOSIS — I10 Essential (primary) hypertension: Secondary | ICD-10-CM | POA: Diagnosis not present

## 2024-05-30 DIAGNOSIS — I2510 Atherosclerotic heart disease of native coronary artery without angina pectoris: Secondary | ICD-10-CM | POA: Diagnosis not present

## 2024-05-30 DIAGNOSIS — E7800 Pure hypercholesterolemia, unspecified: Secondary | ICD-10-CM | POA: Diagnosis not present

## 2024-05-30 DIAGNOSIS — N400 Enlarged prostate without lower urinary tract symptoms: Secondary | ICD-10-CM | POA: Diagnosis not present

## 2024-05-30 DIAGNOSIS — I4891 Unspecified atrial fibrillation: Secondary | ICD-10-CM | POA: Diagnosis not present

## 2024-05-30 MED ORDER — POTASSIUM CHLORIDE CRYS ER 20 MEQ PO TBCR
20.0000 meq | EXTENDED_RELEASE_TABLET | Freq: Every day | ORAL | 1 refills | Status: DC
  Filled 2024-05-30 – 2024-06-03 (×2): qty 30, 30d supply, fill #0

## 2024-05-31 ENCOUNTER — Other Ambulatory Visit (HOSPITAL_COMMUNITY): Payer: Self-pay

## 2024-05-31 ENCOUNTER — Other Ambulatory Visit: Payer: Self-pay

## 2024-06-03 ENCOUNTER — Other Ambulatory Visit (HOSPITAL_COMMUNITY): Payer: Self-pay

## 2024-06-05 ENCOUNTER — Other Ambulatory Visit (HOSPITAL_COMMUNITY): Payer: Self-pay

## 2024-06-05 ENCOUNTER — Other Ambulatory Visit: Payer: Self-pay

## 2024-06-06 ENCOUNTER — Other Ambulatory Visit (HOSPITAL_COMMUNITY): Payer: Self-pay

## 2024-06-12 ENCOUNTER — Other Ambulatory Visit: Payer: Self-pay

## 2024-06-12 ENCOUNTER — Other Ambulatory Visit: Payer: Self-pay | Admitting: Nurse Practitioner

## 2024-06-12 ENCOUNTER — Other Ambulatory Visit (HOSPITAL_COMMUNITY): Payer: Self-pay

## 2024-06-12 MED ORDER — HYDROCORTISONE 2.5 % EX CREA
1.0000 | TOPICAL_CREAM | Freq: Two times a day (BID) | CUTANEOUS | 0 refills | Status: DC | PRN
  Filled 2024-06-12: qty 60, 20d supply, fill #0

## 2024-06-13 ENCOUNTER — Other Ambulatory Visit: Payer: Self-pay

## 2024-06-14 ENCOUNTER — Other Ambulatory Visit (HOSPITAL_COMMUNITY): Payer: Self-pay

## 2024-06-15 ENCOUNTER — Other Ambulatory Visit (HOSPITAL_COMMUNITY): Payer: Self-pay

## 2024-06-15 MED ORDER — FUROSEMIDE 20 MG PO TABS
20.0000 mg | ORAL_TABLET | Freq: Every day | ORAL | 0 refills | Status: AC
  Filled 2024-06-15 – 2024-07-16 (×3): qty 90, 90d supply, fill #0

## 2024-06-16 ENCOUNTER — Other Ambulatory Visit (HOSPITAL_COMMUNITY): Payer: Self-pay

## 2024-06-16 ENCOUNTER — Encounter: Payer: Self-pay | Admitting: Hematology and Oncology

## 2024-06-16 DIAGNOSIS — I4891 Unspecified atrial fibrillation: Secondary | ICD-10-CM | POA: Diagnosis not present

## 2024-06-16 DIAGNOSIS — I2510 Atherosclerotic heart disease of native coronary artery without angina pectoris: Secondary | ICD-10-CM | POA: Diagnosis not present

## 2024-06-16 DIAGNOSIS — I10 Essential (primary) hypertension: Secondary | ICD-10-CM | POA: Diagnosis not present

## 2024-06-26 ENCOUNTER — Other Ambulatory Visit: Payer: Self-pay | Admitting: Cardiovascular Disease

## 2024-06-26 ENCOUNTER — Other Ambulatory Visit: Payer: Self-pay | Admitting: Nurse Practitioner

## 2024-06-26 ENCOUNTER — Other Ambulatory Visit (HOSPITAL_COMMUNITY): Payer: Self-pay

## 2024-06-27 ENCOUNTER — Other Ambulatory Visit (HOSPITAL_COMMUNITY): Payer: Self-pay

## 2024-06-27 MED ORDER — TAMSULOSIN HCL 0.4 MG PO CAPS
0.4000 mg | ORAL_CAPSULE | Freq: Every day | ORAL | 0 refills | Status: AC
  Filled 2024-06-28 – 2024-09-06 (×18): qty 90, 90d supply, fill #0

## 2024-06-27 MED ORDER — PANTOPRAZOLE SODIUM 40 MG PO TBEC
40.0000 mg | DELAYED_RELEASE_TABLET | Freq: Every morning | ORAL | 0 refills | Status: AC
  Filled 2024-06-28 – 2024-08-28 (×30): qty 90, 90d supply, fill #0

## 2024-06-28 ENCOUNTER — Other Ambulatory Visit (HOSPITAL_COMMUNITY): Payer: Self-pay

## 2024-06-28 ENCOUNTER — Other Ambulatory Visit (HOSPITAL_BASED_OUTPATIENT_CLINIC_OR_DEPARTMENT_OTHER): Payer: Self-pay

## 2024-06-28 ENCOUNTER — Other Ambulatory Visit: Payer: Self-pay

## 2024-06-28 MED ORDER — POTASSIUM CHLORIDE CRYS ER 20 MEQ PO TBCR
20.0000 meq | EXTENDED_RELEASE_TABLET | Freq: Every day | ORAL | 3 refills | Status: AC
  Filled 2024-06-28: qty 90, 90d supply, fill #0
  Filled 2024-09-23: qty 90, 90d supply, fill #1

## 2024-06-28 MED ORDER — BISOPROLOL FUMARATE 10 MG PO TABS
15.0000 mg | ORAL_TABLET | Freq: Every day | ORAL | 3 refills | Status: AC
  Filled 2024-06-28 – 2024-09-10 (×2): qty 135, 90d supply, fill #0

## 2024-06-28 MED ORDER — ATORVASTATIN CALCIUM 20 MG PO TABS
20.0000 mg | ORAL_TABLET | Freq: Every day | ORAL | 3 refills | Status: AC
  Filled 2024-06-28 – 2024-09-10 (×2): qty 90, 90d supply, fill #0

## 2024-06-29 ENCOUNTER — Other Ambulatory Visit (HOSPITAL_COMMUNITY): Payer: Self-pay

## 2024-06-29 ENCOUNTER — Other Ambulatory Visit: Payer: Self-pay

## 2024-06-29 ENCOUNTER — Encounter (HOSPITAL_COMMUNITY): Payer: Self-pay

## 2024-06-30 ENCOUNTER — Other Ambulatory Visit (HOSPITAL_COMMUNITY): Payer: Self-pay

## 2024-06-30 DIAGNOSIS — E7800 Pure hypercholesterolemia, unspecified: Secondary | ICD-10-CM | POA: Diagnosis not present

## 2024-06-30 DIAGNOSIS — I4891 Unspecified atrial fibrillation: Secondary | ICD-10-CM | POA: Diagnosis not present

## 2024-06-30 DIAGNOSIS — I10 Essential (primary) hypertension: Secondary | ICD-10-CM | POA: Diagnosis not present

## 2024-06-30 DIAGNOSIS — N400 Enlarged prostate without lower urinary tract symptoms: Secondary | ICD-10-CM | POA: Diagnosis not present

## 2024-06-30 DIAGNOSIS — I2510 Atherosclerotic heart disease of native coronary artery without angina pectoris: Secondary | ICD-10-CM | POA: Diagnosis not present

## 2024-07-01 ENCOUNTER — Other Ambulatory Visit (HOSPITAL_COMMUNITY): Payer: Self-pay

## 2024-07-03 ENCOUNTER — Other Ambulatory Visit (HOSPITAL_COMMUNITY): Payer: Self-pay

## 2024-07-04 ENCOUNTER — Other Ambulatory Visit: Payer: Self-pay

## 2024-07-04 ENCOUNTER — Other Ambulatory Visit (HOSPITAL_COMMUNITY): Payer: Self-pay

## 2024-07-05 ENCOUNTER — Other Ambulatory Visit: Payer: Self-pay

## 2024-07-05 ENCOUNTER — Other Ambulatory Visit (HOSPITAL_COMMUNITY): Payer: Self-pay

## 2024-07-05 MED ORDER — AMOXICILLIN 500 MG PO CAPS
2000.0000 mg | ORAL_CAPSULE | Freq: Once | ORAL | 0 refills | Status: AC
  Filled 2024-07-05: qty 21, 5d supply, fill #0

## 2024-07-06 ENCOUNTER — Other Ambulatory Visit: Payer: Self-pay

## 2024-07-06 ENCOUNTER — Other Ambulatory Visit (HOSPITAL_COMMUNITY): Payer: Self-pay

## 2024-07-07 ENCOUNTER — Other Ambulatory Visit (HOSPITAL_BASED_OUTPATIENT_CLINIC_OR_DEPARTMENT_OTHER): Payer: Self-pay

## 2024-07-07 ENCOUNTER — Other Ambulatory Visit: Payer: Self-pay

## 2024-07-07 ENCOUNTER — Other Ambulatory Visit (HOSPITAL_COMMUNITY): Payer: Self-pay

## 2024-07-08 ENCOUNTER — Other Ambulatory Visit (HOSPITAL_COMMUNITY): Payer: Self-pay

## 2024-07-09 ENCOUNTER — Other Ambulatory Visit (HOSPITAL_COMMUNITY): Payer: Self-pay

## 2024-07-10 ENCOUNTER — Other Ambulatory Visit (HOSPITAL_COMMUNITY): Payer: Self-pay

## 2024-07-10 ENCOUNTER — Other Ambulatory Visit: Payer: Self-pay

## 2024-07-11 ENCOUNTER — Other Ambulatory Visit (HOSPITAL_COMMUNITY): Payer: Self-pay

## 2024-07-12 ENCOUNTER — Other Ambulatory Visit (HOSPITAL_COMMUNITY): Payer: Self-pay

## 2024-07-13 ENCOUNTER — Other Ambulatory Visit (HOSPITAL_COMMUNITY): Payer: Self-pay

## 2024-07-14 ENCOUNTER — Other Ambulatory Visit (HOSPITAL_COMMUNITY): Payer: Self-pay

## 2024-07-14 ENCOUNTER — Other Ambulatory Visit (HOSPITAL_BASED_OUTPATIENT_CLINIC_OR_DEPARTMENT_OTHER): Payer: Self-pay

## 2024-07-15 ENCOUNTER — Other Ambulatory Visit (HOSPITAL_COMMUNITY): Payer: Self-pay

## 2024-07-16 ENCOUNTER — Other Ambulatory Visit (HOSPITAL_COMMUNITY): Payer: Self-pay

## 2024-07-16 DIAGNOSIS — I2510 Atherosclerotic heart disease of native coronary artery without angina pectoris: Secondary | ICD-10-CM | POA: Diagnosis not present

## 2024-07-16 DIAGNOSIS — I4891 Unspecified atrial fibrillation: Secondary | ICD-10-CM | POA: Diagnosis not present

## 2024-07-16 DIAGNOSIS — I10 Essential (primary) hypertension: Secondary | ICD-10-CM | POA: Diagnosis not present

## 2024-07-17 ENCOUNTER — Other Ambulatory Visit (HOSPITAL_COMMUNITY): Payer: Self-pay

## 2024-07-17 ENCOUNTER — Other Ambulatory Visit: Payer: Self-pay

## 2024-07-18 ENCOUNTER — Other Ambulatory Visit (HOSPITAL_COMMUNITY): Payer: Self-pay

## 2024-07-19 ENCOUNTER — Other Ambulatory Visit (HOSPITAL_COMMUNITY): Payer: Self-pay

## 2024-07-19 ENCOUNTER — Other Ambulatory Visit: Payer: Self-pay

## 2024-07-20 ENCOUNTER — Other Ambulatory Visit (HOSPITAL_COMMUNITY): Payer: Self-pay

## 2024-07-21 ENCOUNTER — Other Ambulatory Visit (HOSPITAL_COMMUNITY): Payer: Self-pay

## 2024-07-22 ENCOUNTER — Other Ambulatory Visit (HOSPITAL_COMMUNITY): Payer: Self-pay

## 2024-07-23 ENCOUNTER — Other Ambulatory Visit (HOSPITAL_COMMUNITY): Payer: Self-pay

## 2024-07-24 ENCOUNTER — Other Ambulatory Visit: Payer: Self-pay

## 2024-07-24 ENCOUNTER — Other Ambulatory Visit (HOSPITAL_COMMUNITY): Payer: Self-pay

## 2024-07-25 ENCOUNTER — Other Ambulatory Visit (HOSPITAL_COMMUNITY): Payer: Self-pay

## 2024-07-26 ENCOUNTER — Other Ambulatory Visit (HOSPITAL_COMMUNITY): Payer: Self-pay

## 2024-07-26 ENCOUNTER — Other Ambulatory Visit: Payer: Self-pay

## 2024-07-26 DIAGNOSIS — Z0000 Encounter for general adult medical examination without abnormal findings: Secondary | ICD-10-CM | POA: Diagnosis not present

## 2024-07-26 DIAGNOSIS — I4891 Unspecified atrial fibrillation: Secondary | ICD-10-CM | POA: Diagnosis not present

## 2024-07-26 DIAGNOSIS — Z23 Encounter for immunization: Secondary | ICD-10-CM | POA: Diagnosis not present

## 2024-07-26 DIAGNOSIS — I2510 Atherosclerotic heart disease of native coronary artery without angina pectoris: Secondary | ICD-10-CM | POA: Diagnosis not present

## 2024-07-26 DIAGNOSIS — E7800 Pure hypercholesterolemia, unspecified: Secondary | ICD-10-CM | POA: Diagnosis not present

## 2024-07-26 DIAGNOSIS — R0981 Nasal congestion: Secondary | ICD-10-CM | POA: Diagnosis not present

## 2024-07-26 DIAGNOSIS — Z1211 Encounter for screening for malignant neoplasm of colon: Secondary | ICD-10-CM | POA: Diagnosis not present

## 2024-07-26 DIAGNOSIS — E559 Vitamin D deficiency, unspecified: Secondary | ICD-10-CM | POA: Diagnosis not present

## 2024-07-26 DIAGNOSIS — Z8639 Personal history of other endocrine, nutritional and metabolic disease: Secondary | ICD-10-CM | POA: Diagnosis not present

## 2024-07-26 DIAGNOSIS — Z6824 Body mass index (BMI) 24.0-24.9, adult: Secondary | ICD-10-CM | POA: Diagnosis not present

## 2024-07-27 ENCOUNTER — Other Ambulatory Visit (HOSPITAL_COMMUNITY): Payer: Self-pay

## 2024-07-30 DIAGNOSIS — I2510 Atherosclerotic heart disease of native coronary artery without angina pectoris: Secondary | ICD-10-CM | POA: Diagnosis not present

## 2024-07-30 DIAGNOSIS — E7800 Pure hypercholesterolemia, unspecified: Secondary | ICD-10-CM | POA: Diagnosis not present

## 2024-07-30 DIAGNOSIS — I4891 Unspecified atrial fibrillation: Secondary | ICD-10-CM | POA: Diagnosis not present

## 2024-07-30 DIAGNOSIS — I10 Essential (primary) hypertension: Secondary | ICD-10-CM | POA: Diagnosis not present

## 2024-07-30 DIAGNOSIS — N400 Enlarged prostate without lower urinary tract symptoms: Secondary | ICD-10-CM | POA: Diagnosis not present

## 2024-08-03 ENCOUNTER — Encounter: Payer: Self-pay | Admitting: Cardiovascular Disease

## 2024-08-03 NOTE — Telephone Encounter (Signed)
 Error

## 2024-08-04 ENCOUNTER — Ambulatory Visit: Attending: Cardiovascular Disease | Admitting: Cardiovascular Disease

## 2024-08-04 ENCOUNTER — Encounter: Payer: Self-pay | Admitting: Cardiovascular Disease

## 2024-08-04 VITALS — BP 156/68 | HR 56 | Ht 64.0 in | Wt 143.0 lb

## 2024-08-04 DIAGNOSIS — I4821 Permanent atrial fibrillation: Secondary | ICD-10-CM

## 2024-08-04 DIAGNOSIS — I350 Nonrheumatic aortic (valve) stenosis: Secondary | ICD-10-CM | POA: Diagnosis not present

## 2024-08-04 DIAGNOSIS — I2510 Atherosclerotic heart disease of native coronary artery without angina pectoris: Secondary | ICD-10-CM | POA: Diagnosis not present

## 2024-08-04 DIAGNOSIS — I10 Essential (primary) hypertension: Secondary | ICD-10-CM | POA: Diagnosis not present

## 2024-08-04 DIAGNOSIS — Z9889 Other specified postprocedural states: Secondary | ICD-10-CM

## 2024-08-04 DIAGNOSIS — E7800 Pure hypercholesterolemia, unspecified: Secondary | ICD-10-CM | POA: Diagnosis not present

## 2024-08-04 NOTE — Progress Notes (Signed)
 Cardiology Office Note   Date:  08/06/2024   ID:  Corey Garcia, Corey Garcia 1946/04/19, MRN [id number]  PCP:  Dayna Motto, DO  Cardiologist:  Dr.Cola Highfill   Chief Complaint  Patient presents with   Aortic Stenosis      History of Present Illness: Dawon Troop is a 78 y.o. male with longstanding persistent (probably permanent) atrial fibrillation, CAD, vasovagal syncope, severe calcific aortic valve stenosis, AAA s/p EVAR (Dr. Eliza), hypertension, hyperlipidemia, chronic liver disease. He has not required coronary intervention since 2009 when he had cutting balloon atherectomy of the OM2 and DES to the LAD, residual 50% RCA stenosis. He had a low risk nuclear study April 2014.    He has begun to notice exertional fatigue and dyspnea, as well as lower extremity edema over the last couple of months.  He started taking furosemide  in September with improvement in edema.  He has not had angina or syncope.  He continues to try to walk 1 or 2 miles daily.  He has not had intermittent claudication, palpitations, dizziness, focal neurological complaints.  He has no physical complaints.  He has to take on a lot more household chores after his wife passed away about 3 years ago.  He continues to keep a garden.  He walks 1 or 2 miles every day.  His most recent echocardiogram in August 2025 shows normal left ventricular systolic function with EF 60 to 65% but shows worsening aortic stenosis (paradoxical low-flow low gradient (mean gradient 20 mmHg, dimensionless valve index 0.18, stroke-volume index 17 mL/m, calculated aortic valve area 0.46 cm).  His EVAR ultrasound in November 2024 shows that he has a less significant endoleak at the level of the abdominal aortic aneurysm and the aneurysm has not changed in size at 3.2 cm.  He is probably due for an updated ultrasound before the end of the year.  Very recent lipid profile shows total cholesterol 54, HDL 76, LDL 65, triglycerides 63 and his  most recent hemoglobin A1c is 5.7%.  He has borderline renal insufficiency with a most recent creatinine of 1.17, GFR 64.  Past Medical History:  Diagnosis Date   Alcoholism (HCC) 03/15/2014   Anemia    Arthritis    all over, some ra   Bradycardia by electrocardiogram    BB decreased 11/12/11   CAD (coronary artery disease)    last cath=01/2008, PCI   CHF (congestive heart failure) (HCC)    Dyslipidemia    ED (erectile dysfunction)    viagra helps   GERD (gastroesophageal reflux disease)    HTN (hypertension)    Iron overload 03/15/2014   Liver disease due to alcohol 03/15/2014   pt .denies at preop   Murmur, cardiac    faint early systolic grade 1/6 aortic ejection murmur   Myocardial infarction [facility])    Peripheral vascular disease    Rib pain 04/16/2014   SCCA (squamous cell carcinoma) of skin 12/19/2020   Left Malar Cheek Lateral (well diff) (tx p bx)   SCCA (squamous cell carcinoma) of skin 12/19/2020   Right Scalp Posterior (in situ) (tx p bx)   SCCA (squamous cell carcinoma) of skin 12/19/2020   Right Scalp Anterior (in situ) (tx p bx)   Skin cancer    basal cell ca X 4    Past Surgical History:  Procedure Laterality Date   ABDOMINAL AORTIC ENDOVASCULAR STENT GRAFT N/A 01/13/2018   Procedure: ABDOMINAL AORTIC ENDOVASCULAR STENT GRAFT;  Surgeon: Eliza Lonni RAMAN, MD;  Location: [facility] OR;  Service: Vascular;  Laterality: N/A;   CORONARY ANGIOPLASTY WITH STENT PLACEMENT  01/31/2008   PCI with cutting balloon arthrectomy of the ostium of the circ obtuse marg 2 vessel   CORONARY ANGIOPLASTY WITH STENT PLACEMENT  01/27/08   nl LV function, PTCA/stenting of LAD with 3x59mm Cypher post dilated 3.68mm   EYE SURGERY     cataract bil   FRACTURE SURGERY     right elbow when 78 years old   HERNIA REPAIR     IR RADIOLOGIST EVAL & MGMT  03/27/2021   KNEE ARTHROSCOPY Left    MOHS SURGERY     x4   NASAL SEPTUM SURGERY     x2   TOTAL KNEE ARTHROPLASTY Left 11/08/2018   Procedure:  TOTAL KNEE ARTHROPLASTY;  Surgeon: Beverley Evalene BIRCH, MD;  Location: [facility] ORS;  Service: Orthopedics;  Laterality: Left;     Current Outpatient Medications  Medication Sig Dispense Refill   apixaban  (ELIQUIS ) 5 MG TABS tablet Take 1 tablet (5 mg total) by mouth 2 (two) times daily. 200 tablet 1   atorvastatin  (LIPITOR) 20 MG tablet Take 1 tablet (20 mg total) by mouth daily. 90 tablet 3   bisoprolol  (ZEBETA ) 10 MG tablet Take 1.5 tablets (15 mg total) by mouth daily. 135 tablet 3   cholecalciferol  (VITAMIN D3) 25 MCG (1000 UNIT) tablet Take 1 tablet (1,000 Units total) by mouth daily. 100 tablet 1   dapagliflozin  propanediol (FARXIGA ) 5 MG TABS tablet Take 5 mg by mouth daily.     Fluocinolone  Acetonide Scalp 0.01 % OIL Apply to the affected area on scalp every night at bedtime as needed (not to face, groin or underarms) 118.28 mL 3   fluticasone  (FLONASE ) 50 MCG/ACT nasal spray Place 1 spray into both nostrils 2 (two) times daily. 16 g 5   furosemide  (LASIX ) 20 MG tablet Take 1 tablet (20 mg total) by mouth daily. 90 tablet 0   hydrocortisone  2.5 % cream Apply 1 Application topically to the affected area 2 (two) times daily as needed. 60 g 0   simethicone  (MYLICON) 80 MG chewable tablet Chew 80 mg by mouth as needed.     tamsulosin  (FLOMAX ) 0.4 MG CAPS capsule Take 1 capsule (0.4 mg total) by mouth daily. 90 capsule 0   tiZANidine  (ZANAFLEX ) 4 MG tablet Take 1 tablet (4 mg total) by mouth at bedtime. 10 tablet 0   acetaminophen -codeine  (TYLENOL  #3) 300-30 MG tablet Take 1 tablet by mouth every 4-6 hours as needed for pain. 16 tablet 0   amoxicillin  (AMOXIL ) 500 MG capsule Take 1 capsule (500 mg total) by mouth every 8 (eight) hours. (Patient not taking: Reported on 05/11/2024) 21 capsule 0   augmented betamethasone  dipropionate (DIPROLENE -AF) 0.05 % cream Apply to affected area on back up to 2 times a day as needed (not to face, groin or underarms) (Patient not taking: Reported on 05/11/2024) 50 g 3    dapagliflozin  propanediol (FARXIGA ) 10 MG TABS tablet Take 1 tablet (10 mg total) by mouth daily. 30 tablet 11   dapagliflozin  propanediol (FARXIGA ) 5 MG TABS tablet Take 1 tablet (5 mg total) by mouth daily. 30 tablet 11   doxycycline  (MONODOX ) 100 MG capsule Take 1 capsule (100 mg total) by mouth 2 (two) times daily for 5 days. (Patient not taking: Reported on 05/11/2024) 10 capsule 0   Fluocinolone  Acetonide Scalp 0.01 % OIL Apply 1 Application topically to scalp at bedtime as needed (not to face, groin, or  underarm). 118.28 mL 3   fluticasone  (FLONASE  ALLERGY RELIEF) 50 MCG/ACT nasal spray Place 1 spray into both nostrils 2 (two) times daily. 16 g 0   fluticasone  (FLONASE ) 50 MCG/ACT nasal spray Place 2 sprays into both nostrils daily. (Patient not taking: Reported on 05/11/2024)     hydrocortisone  (ANUSOL -HC) 2.5 % rectal cream Place 1 application rectally as needed for hemorrhoids or itching.     loratadine  (CLARITIN ) 10 MG tablet Take 10 mg by mouth daily.      Olopatadine  HCl (PATADAY  OP) Place 1 Drop/kg into both eyes daily at 12 noon.     Olopatadine  HCl 0.2 % SOLN Place 1 drop into affected eye daily. 2.5 mL 0   pantoprazole  (PROTONIX ) 40 MG tablet Take 1 tablet (40 mg total) by mouth 1/2 to 1 hour before morning meal. 90 tablet 0   potassium chloride  SA (KLOR-CON  M20) 20 MEQ tablet Take 1 tablet (20 mEq total) by mouth daily. 90 tablet 3   predniSONE  (DELTASONE ) 20 MG tablet Take 2 tablets (40 mg total) by mouth daily with food or milk. 10 tablet 0   Probiotic Product (PROBIOTIC DAILY PO) Take 1 tablet by mouth daily in the afternoon.     Simethicone  (GAS-X PO) Take 80 mg by mouth daily at 6 (six) AM. 2 tablets     No current facility-administered medications for this visit.    Allergies:   Lisinopril, Minocycline hcl, Sulfa antibiotics, Sulfamethoxazole, Sulfamethoxazole-trimethoprim, Thiazide-type diuretics, and Codeine     Social History:  The patient  reports that he quit  smoking about 41 years ago. His smoking use included cigarettes. He has never used smokeless tobacco. He reports current alcohol use of about 28.0 standard drinks of alcohol per week. He reports that he does not use drugs.   Family History:  The patient's family history includes Heart disease in his brother, father, and mother; Heart failure in his brother, father, and mother.     PHYSICAL EXAM: VS:  BP (!) 156/68 (BP Location: Left Arm, Patient Position: Sitting, Cuff Size: Normal)   Pulse (!) 56   Ht 5' 4 (1.626 m)   Wt 143 lb (64.9 kg)   SpO2 94%   BMI 24.55 kg/m  , BMI Body mass index is 24.55 kg/m.   General: Alert, oriented x3, no distress Head: no evidence of trauma, PERRL, EOMI, no exophtalmos or lid lag, no myxedema, no xanthelasma; normal ears, nose and oropharynx Neck: normal jugular venous pulsations and no hepatojugular reflux; carotid pulses without delay and with a bilateral carotid bruits Chest: clear to auscultation, no signs of consolidation by percussion or palpation, normal fremitus, symmetrical and full respiratory excursions Cardiovascular: normal position and quality of the apical impulse, regular rhythm, normal first and second heart sounds, 3/6 mid peaking aortic ejection murmur no diastolic murmurs, rubs or gallops Abdomen: no tenderness or distention, no masses by palpation, no abnormal pulsatility or arterial bruits, normal bowel sounds, no hepatosplenomegaly Extremities: no clubbing, cyanosis or edema; 2+ radial, ulnar and brachial pulses bilaterally; 2+ right femoral, posterior tibial and dorsalis pedis pulses; 2+ left femoral, posterior tibial and dorsalis pedis pulses; no subclavian or femoral bruits Neurological: grossly nonfocal Psych: Normal mood and affect   ECHO August 2025:     1. Left ventricular ejection fraction, by estimation, is 60 to 65%. The  left ventricle has normal function. The left ventricle has no regional  wall motion abnormalities.  There is mild concentric left ventricular  hypertrophy. Left ventricular diastolic  function could not be evaluated.   2. Right ventricular systolic function is normal. The right ventricular  size is normal. There is mildly elevated pulmonary artery systolic  pressure. The estimated right ventricular systolic pressure is 40.5 mmHg.   3. The mitral valve is normal in structure. Mild mitral valve  regurgitation. No evidence of mitral stenosis.   4. Tricuspid valve regurgitation is mild to moderate.   5. The aortic valve is calcified. There is severe calcifcation of the  aortic valve. There is severe thickening of the aortic valve. Aortic valve  regurgitation is trivial. Severe aortic valve stenosis. Aortic  regurgitation PHT measures 506 msec. Aortic  valve area, by VTI measures 0.46 cm. Aortic valve mean gradient measures  19.8 mmHg. Aortic valve Vmax measures 2.81 m/s. Although mean AVG and VMax  are c/w mild to moderate AS, the DVI is low at 0.18 and SVI 17. Findings  together most consistent with  paradoxical severe low flow low gradient AS.   6. The inferior vena cava is normal in size with <50% respiratory  variability, suggesting right atrial pressure of 8 mmHg.     EKG: Personally reviewed the most recent ECG from 05/11/2024 which shows Atrial fibrillation with QS pattern in leads V1-V2  EKG Interpretation Date/Time:    Ventricular Rate:    PR Interval:    QRS Duration:    QT Interval:    QTC Calculation:   R Axis:      Text Interpretation:          CTA 08/05/2023  1. Stable size of 3.2 cm saccular abdominal aortic aneurysm status post endovascular aortic repair. Persistent, but significantly decreased type 2 endoleak. 2. No significant change in the size or appearance of the right common iliac artery aneurysm likely arising from a penetrating atherosclerotic ulceration. The aneurysm measures 2.7 x 2.5 cm today. 3. Extensive calcified atherosclerotic plaque. 4.  Bilateral mild renal artery stenoses.   LABS: 05/26/2024: BUN 24; Creatinine, Ser 1.17; Potassium 4.6; Sodium 133  07/26/2024 hemoglobin 12.0  June 23, 2019 Creatinine 0.95, potassium 4.3, hemoglobin 12.0  08/04/2022 Hemoglobin 10.9, creatinine 0.97, potassium 4.3, ALT 18  10/14/2023 Hemoglobin A1c 5.6%  Lipid Panel    Component Value Date/Time   CHOL 142 07/18/2020 0930   TRIG 66 07/18/2020 0930   HDL 67 07/18/2020 0930   CHOLHDL 2.1 07/18/2020 0930   CHOLHDL 3.0 01/27/2008 0652   VLDL 10 01/27/2008 0652   LDLCALC 62 07/18/2020 0930   06/23/2022 Cholesterol 149, HDL 66, LDL 62, triglycerides 120  07/26/2024 Cholesterol 154, HDL 76, LDL 65, triglycerides 63   Wt Readings from Last 3 Encounters:  08/04/24 143 lb (64.9 kg)  05/11/24 144 lb 3.2 oz (65.4 kg)  08/13/23 143 lb (64.9 kg)     1. Aortic valve stenosis, nonrheumatic   2. Permanent atrial fibrillation (HCC)   3. Coronary artery disease involving native coronary artery of native heart without angina pectoris   4. Pure hypercholesterolemia   5. Essential hypertension   6. History of abdominal aortic aneurysm (AAA) repair      ASSESSMENT AND PLAN:  AS: He now has symptomatic severe aortic stenosis (paradoxical low-flow low gradient).  We talked about the natural history of aortic stenosis and poor outcomes without aortic valve replacement.  Discussed options for surgical AVR versus TAVR depending on coronary status, adequate vascular access, presence or absence of aortic aneurysm etc.  He will require CT angiography of the chest abdomen and pelvis and right  and left heart catheterization.  Will refer to the structural heart team.   Afib: Asymptomatic, rate controlled permanent arrhythmia.  CHA2DS2-VASc score is at least 4 (age 53, hypertension, CAD/PAD) .  Compliant with anticoagulation.  Antiarrhythmics do not appear to be indicated, nor is cardioversion. CAD: He has not required revascularization and had a  low risk nuclear stress test most recently 2014.  He will require coronary angiography in anticipation of TAVR versus surgical AVR.  He is on beta-blockers and statin but does not take aspirin  since he is fully anticoagulated.  HLP: All lipid parameters in target range.  Continue statin. HTN: Well-controlled. AAA s/p EVAR: Will reevaluate the size of his abdominal aortic aneurysm and the endoleak of the EVAR when he gets his CT of chest abdomen and pelvis.   Patient Instructions  Medication Instructions:  No changes *If you need a refill on your cardiac medications before your next appointment, please call your pharmacy*  Lab Work: None ordered If you have labs (blood work) drawn today and your tests are completely normal, you will receive your results only by: MyChart Message (if you have MyChart) OR A paper copy in the mail If you have any lab test that is abnormal or we need to change your treatment, we will call you to review the results.  Testing/Procedures: None ordered  Follow-Up: At [facility], you and your health needs are our priority.  As part of our continuing mission to provide you with exceptional heart care, our providers are all part of one team.  This team includes your primary Cardiologist (physician) and Advanced Practice Providers or APPs (Physician Assistants and Nurse Practitioners) who all work together to provide you with the care you need, when you need it.  Your next appointment:   6 month(s)  Provider:   Jerel Balding, MD    We recommend signing up for the patient portal called MyChart.  Sign up information is provided on this After Visit Summary.  MyChart is used to connect with patients for Virtual Visits (Telemedicine).  Patients are able to view lab/test results, encounter notes, upcoming appointments, etc.  Non-urgent messages can be sent to your provider as well.   To learn more about what you can do with MyChart, go to  [email or web address].      Jerel Balding, MD, FACC [facility] [phone] office [phone] pager

## 2024-08-04 NOTE — Patient Instructions (Signed)
 Medication Instructions:  No changes *If you need a refill on your cardiac medications before your next appointment, please call your pharmacy*  Lab Work: None ordered If you have labs (blood work) drawn today and your tests are completely normal, you will receive your results only by: MyChart Message (if you have MyChart) OR A paper copy in the mail If you have any lab test that is abnormal or we need to change your treatment, we will call you to review the results.  Testing/Procedures: None ordered  Follow-Up: At [facility], you and your health needs are our priority.  As part of our continuing mission to provide you with exceptional heart care, our providers are all part of one team.  This team includes your primary Cardiologist (physician) and Advanced Practice Providers or APPs (Physician Assistants and Nurse Practitioners) who all work together to provide you with the care you need, when you need it.  Your next appointment:   6 month(s)  Provider:   Jerel Balding, MD    We recommend signing up for the patient portal called MyChart.  Sign up information is provided on this After Visit Summary.  MyChart is used to connect with patients for Virtual Visits (Telemedicine).  Patients are able to view lab/test results, encounter notes, upcoming appointments, etc.  Non-urgent messages can be sent to your provider as well.   To learn more about what you can do with MyChart, go to [email or web address].

## 2024-08-05 DIAGNOSIS — Z1212 Encounter for screening for malignant neoplasm of rectum: Secondary | ICD-10-CM | POA: Diagnosis not present

## 2024-08-05 DIAGNOSIS — Z1211 Encounter for screening for malignant neoplasm of colon: Secondary | ICD-10-CM | POA: Diagnosis not present

## 2024-08-07 ENCOUNTER — Other Ambulatory Visit (HOSPITAL_COMMUNITY): Payer: Self-pay

## 2024-08-08 NOTE — Progress Notes (Unsigned)
 [facility] Consult Note  No chief complaint on file.  History of Present Illness: 78 yo male with history of CAD, HLD, HTN, persistent atrial fibrillation, vasovagal syncope, AAA s/p EVAR, chronic liver disease and severe aortic stenosis who is here today as a new consult, referred by Dr. Francyne, for further discussion regarding his aortic stenosis and possible TAVR. He has CAD and had a cardiac cath in 2009 with placement of a drug eluting stent in the LAD, cutting balloon angioplasty of the OM branch with moderate disease noted in the RCA. No cath since then. Echo August 2025 with LVEF=60-65%. Mild MR. Severe paradoxical low flow/low gradient aortic stenosis with mean gradient 20 mmHg, AVA 0.44 cm2, SVI 17, DI 0.18.   He tells me today that he *** He lives in *** He is retired Engineer, Manufacturing ***  Primary Care Physician: Dayna Motto, DO Primary Cardiologist: Croitoru Referring Cardiologist: Croitoru  Past Medical History:  Diagnosis Date   Alcoholism (HCC) 03/15/2014   Anemia    Arthritis    all over, some ra   Bradycardia by electrocardiogram    BB decreased 11/12/11   CAD (coronary artery disease)    last cath=01/2008, PCI   CHF (congestive heart failure) (HCC)    Dyslipidemia    ED (erectile dysfunction)    viagra helps   GERD (gastroesophageal reflux disease)    HTN (hypertension)    Iron overload 03/15/2014   Liver disease due to alcohol 03/15/2014   pt .denies at preop   Murmur, cardiac    faint early systolic grade 1/6 aortic ejection murmur   Myocardial infarction [facility])    Peripheral vascular disease    Rib pain 04/16/2014   SCCA (squamous cell carcinoma) of skin 12/19/2020   Left Malar Cheek Lateral (well diff) (tx p bx)   SCCA (squamous cell carcinoma) of skin 12/19/2020   Right Scalp Posterior (in situ) (tx p bx)   SCCA (squamous cell carcinoma) of skin 12/19/2020   Right Scalp Anterior (in situ) (tx p bx)   Skin cancer    basal cell ca X 4     Past Surgical History:  Procedure Laterality Date   ABDOMINAL AORTIC ENDOVASCULAR STENT GRAFT N/A 01/13/2018   Procedure: ABDOMINAL AORTIC ENDOVASCULAR STENT GRAFT;  Surgeon: Eliza Lonni RAMAN, MD;  Location: [facility] OR;  Service: Vascular;  Laterality: N/A;   CORONARY ANGIOPLASTY WITH STENT PLACEMENT  01/31/2008   PCI with cutting balloon arthrectomy of the ostium of the circ obtuse marg 2 vessel   CORONARY ANGIOPLASTY WITH STENT PLACEMENT  01/27/08   nl LV function, PTCA/stenting of LAD with 3x32mm Cypher post dilated 3.76mm   EYE SURGERY     cataract bil   FRACTURE SURGERY     right elbow when 78 years old   HERNIA REPAIR     IR RADIOLOGIST EVAL & MGMT  03/27/2021   KNEE ARTHROSCOPY Left    MOHS SURGERY     x4   NASAL SEPTUM SURGERY     x2   TOTAL KNEE ARTHROPLASTY Left 11/08/2018   Procedure: TOTAL KNEE ARTHROPLASTY;  Surgeon: Beverley Evalene BIRCH, MD;  Location: [facility] ORS;  Service: Orthopedics;  Laterality: Left;    Current Outpatient Medications  Medication Sig Dispense Refill   apixaban  (ELIQUIS ) 5 MG TABS tablet Take 1 tablet (5 mg total) by mouth 2 (two) times daily. 200 tablet 1   atorvastatin  (LIPITOR) 20 MG tablet Take 1 tablet (20 mg total) by mouth daily. 90  tablet 3   augmented betamethasone  dipropionate (DIPROLENE -AF) 0.05 % cream Apply to affected area on back up to 2 times a day as needed (not to face, groin or underarms) (Patient not taking: Reported on 05/11/2024) 50 g 3   bisoprolol  (ZEBETA ) 10 MG tablet Take 1.5 tablets (15 mg total) by mouth daily. 135 tablet 3   cholecalciferol  (VITAMIN D3) 25 MCG (1000 UNIT) tablet Take 1 tablet (1,000 Units total) by mouth daily. 100 tablet 1   dapagliflozin  propanediol (FARXIGA ) 10 MG TABS tablet Take 1 tablet (10 mg total) by mouth daily. 30 tablet 11   dapagliflozin  propanediol (FARXIGA ) 5 MG TABS tablet Take 1 tablet (5 mg total) by mouth daily. 30 tablet 11   dapagliflozin  propanediol (FARXIGA ) 5 MG TABS tablet Take 5 mg by  mouth daily.     doxycycline  (MONODOX ) 100 MG capsule Take 1 capsule (100 mg total) by mouth 2 (two) times daily for 5 days. (Patient not taking: Reported on 05/11/2024) 10 capsule 0   Fluocinolone  Acetonide Scalp 0.01 % OIL Apply to the affected area on scalp every night at bedtime as needed (not to face, groin or underarms) 118.28 mL 3   Fluocinolone  Acetonide Scalp 0.01 % OIL Apply 1 Application topically to scalp at bedtime as needed (not to face, groin, or underarm). 118.28 mL 3   fluticasone  (FLONASE  ALLERGY RELIEF) 50 MCG/ACT nasal spray Place 1 spray into both nostrils 2 (two) times daily. 16 g 0   fluticasone  (FLONASE ) 50 MCG/ACT nasal spray Place 2 sprays into both nostrils daily. (Patient not taking: Reported on 05/11/2024)     fluticasone  (FLONASE ) 50 MCG/ACT nasal spray Place 1 spray into both nostrils 2 (two) times daily. 16 g 5   furosemide  (LASIX ) 20 MG tablet Take 1 tablet (20 mg total) by mouth daily. 90 tablet 0   hydrocortisone  (ANUSOL -HC) 2.5 % rectal cream Place 1 application rectally as needed for hemorrhoids or itching.     hydrocortisone  2.5 % cream Apply 1 Application topically to the affected area 2 (two) times daily as needed. 60 g 0   loratadine  (CLARITIN ) 10 MG tablet Take 10 mg by mouth daily.      Olopatadine  HCl (PATADAY  OP) Place 1 Drop/kg into both eyes daily at 12 noon.     Olopatadine  HCl 0.2 % SOLN Place 1 drop into affected eye daily. 2.5 mL 0   pantoprazole  (PROTONIX ) 40 MG tablet Take 1 tablet (40 mg total) by mouth 1/2 to 1 hour before morning meal. 90 tablet 0   potassium chloride  SA (KLOR-CON  M20) 20 MEQ tablet Take 1 tablet (20 mEq total) by mouth daily. 90 tablet 3   Probiotic Product (PROBIOTIC DAILY PO) Take 1 tablet by mouth daily in the afternoon.     Simethicone  (GAS-X PO) Take 80 mg by mouth daily at 6 (six) AM. 2 tablets     simethicone  (MYLICON) 80 MG chewable tablet Chew 80 mg by mouth as needed.     tamsulosin  (FLOMAX ) 0.4 MG CAPS capsule Take 1  capsule (0.4 mg total) by mouth daily. 90 capsule 0   tiZANidine  (ZANAFLEX ) 4 MG tablet Take 1 tablet (4 mg total) by mouth at bedtime. 10 tablet 0   No current facility-administered medications for this visit.    Allergies  Allergen Reactions   Lisinopril Other (See Comments)    PHOTOSENSITIVITY ON ARMS  Other Reaction(s): photosensitivity   Minocycline Hcl     Flank pain  Other Reaction(s): flank pain  Sulfa Antibiotics     Drop in BP   Sulfamethoxazole     Other Reaction(s): bp drops   Sulfamethoxazole-Trimethoprim     Other Reaction(s): hyponatremia?   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  Tobacco Use   Smoking status: Former    Current packs/day: 0.00    Types: Cigarettes    Quit date: 10/31/1982    Years since quitting: 41.8   Smokeless tobacco: Never  Vaping Use   Vaping status: Never Used  Substance and Sexual Activity   Alcohol use: Yes    Alcohol/week: 28.0 standard drinks of alcohol    Types: 28 Cans of beer per week    Comment: 3-4 beers or hard apple ciders/ day 01/10/2018   Drug use: No   Sexual activity: Not Currently  Other Topics Concern   Not on file  Social History Narrative   Not on file   Social Drivers of Health   Financial Resource Strain: Not on file  Food Insecurity: Not on file  Transportation Needs: Not on file  Physical Activity: Unknown (05/11/2018)   Exercise Vital Sign    Days of Exercise per Week: Patient declined    Minutes of Exercise per Session: Patient declined  Stress: No Stress Concern Present (05/11/2018)   [employer] of Occupational [employer] -  Occupational Stress Questionnaire    Feeling of Stress : Only a little  Social Connections: Not on file  Intimate Partner Violence: Not on file    Family History  Problem Relation Age of Onset   Heart failure Mother        heart disease   Heart disease Mother    Heart failure Father        heart disease   Heart disease Father    Heart disease Brother    Heart failure Brother        bad heart valve    Review of Systems:  As stated in the HPI and otherwise negative.   There were no vitals taken for this visit.  Physical Examination: General: Well developed, well nourished, NAD  HEENT: OP clear, mucus membranes moist  SKIN: warm, dry. No rashes. Neuro: No focal deficits  Musculoskeletal: Muscle strength 5/5 all ext  Psychiatric: Mood and affect normal  Neck: No JVD, no carotid bruits, no thyromegaly, no lymphadenopathy.  Lungs:Clear bilaterally, no wheezes, rhonci, crackles Cardiovascular: Regular rate and rhythm. *** Loud, harsh, late peaking systolic murmur.  Abdomen:Soft. Bowel sounds present. Non-tender.  Extremities: *** No lower extremity edema. Pulses are 2 + in the bilateral DP/PT.  EKG:  EKG {ACTION; IS/IS WNU:[id number]} ordered today. The ekg ordered today demonstrates ***  Echo 04/18/24:  1. Left ventricular ejection fraction, by estimation, is 60 to 65%. The  left ventricle has normal function. The left ventricle has no regional  wall motion abnormalities. There is mild concentric left ventricular  hypertrophy. Left ventricular diastolic  function could not be evaluated.   2. Right ventricular systolic function is normal. The right ventricular  size is normal. There is mildly elevated pulmonary artery systolic  pressure. The  estimated right ventricular systolic pressure is 40.5 mmHg.   3. The mitral valve is normal in structure. Mild mitral valve  regurgitation. No evidence of mitral stenosis.   4. Tricuspid valve regurgitation is mild to moderate.   5. The  aortic valve is calcified. There is severe calcifcation of the  aortic valve. There is severe thickening of the aortic valve. Aortic valve  regurgitation is trivial. Severe aortic valve stenosis. Aortic  regurgitation PHT measures 506 msec. Aortic  valve area, by VTI measures 0.46 cm. Aortic valve mean gradient measures  19.8 mmHg. Aortic valve Vmax measures 2.81 m/s. Although mean AVG and VMax  are c/w mild to moderate AS, the DVI is low at 0.18 and SVI 17. Findings  together most consistent with  paradoxical severe low flow low gradient AS.   6. The inferior vena cava is normal in size with <50% respiratory  variability, suggesting right atrial pressure of 8 mmHg.   FINDINGS   Left Ventricle: Left ventricular ejection fraction, by estimation, is 60  to 65%. The left ventricle has normal function. The left ventricle has no  regional wall motion abnormalities. The left ventricular internal cavity  size was normal in size. There is   mild concentric left ventricular hypertrophy. Left ventricular diastolic  function could not be evaluated.   Right Ventricle: The right ventricular size is normal. No increase in  right ventricular wall thickness. Right ventricular systolic function is  normal. There is mildly elevated pulmonary artery systolic pressure. The  tricuspid regurgitant velocity is 2.85   m/s, and with an assumed right atrial pressure of 8 mmHg, the estimated  right ventricular systolic pressure is 40.5 mmHg.   Left Atrium: Left atrial size was normal in size.   Right Atrium: Right atrial size was normal in size.   Pericardium: There is no evidence of pericardial effusion.   Mitral Valve: The mitral valve is normal in structure. Mild to moderate  mitral annular calcification. Mild mitral valve regurgitation. No evidence  of mitral valve stenosis.   Tricuspid Valve: The tricuspid valve is normal in structure. Tricuspid  valve regurgitation is mild to moderate. No evidence  of tricuspid  stenosis.   Aortic Valve: The aortic valve is calcified. There is severe calcifcation  of the aortic valve. There is severe thickening of the aortic valve.  Aortic valve regurgitation is trivial. Aortic regurgitation PHT measures  506 msec. Severe aortic stenosis is  present. Aortic valve mean gradient measures 19.8 mmHg. Aortic valve peak  gradient measures 31.6 mmHg. Aortic valve area, by VTI measures 0.46 cm.   Pulmonic Valve: The pulmonic valve was normal in structure. Pulmonic valve  regurgitation is not visualized. No evidence of pulmonic stenosis.   Aorta: The aortic root is normal in size and structure.   Venous: The inferior vena cava is normal in size with less than 50%  respiratory variability, suggesting right atrial pressure of 8 mmHg.   IAS/Shunts: No atrial level shunt detected by color flow Doppler.     LEFT VENTRICLE  PLAX 2D  LVIDd:         3.70 cm  LVIDs:         2.50 cm  LV PW:         1.20 cm  LV IVS:        1.20 cm  LVOT diam:     1.80 cm  LV SV:         30  LV SV Index:  17  LVOT Area:     2.54 cm     RIGHT VENTRICLE             IVC  RV Basal diam:  3.20 cm     IVC diam: 1.80 cm  RV S prime:     10.30 cm/s  TAPSE (M-mode): 1.9 cm   LEFT ATRIUM             Index        RIGHT ATRIUM           Index  LA diam:        4.50 cm 2.65 cm/m   RA Area:     14.90 cm  LA Vol (A2C):   56.0 ml 33.01 ml/m  RA Volume:   37.80 ml  22.28 ml/m  LA Vol (A4C):   49.7 ml 29.30 ml/m  LA Biplane Vol: 55.1 ml 32.48 ml/m   AORTIC VALVE  AV Area (Vmax):    0.49 cm  AV Area (Vmean):   0.44 cm  AV Area (VTI):     0.46 cm  AV Vmax:           281.20 cm/s  AV Vmean:          212.200 cm/s  AV VTI:            0.638 m  AV Peak Grad:      31.6 mmHg  AV Mean Grad:      19.8 mmHg  LVOT Vmax:         53.67 cm/s  LVOT Vmean:        36.533 cm/s  LVOT VTI:          0.116 m  LVOT/AV VTI ratio: 0.18  AI PHT:            506 msec    AORTA  Ao Root diam:  3.10 cm  Ao Asc diam:  3.20 cm   MR Peak grad: 112.6 mmHg    TRICUSPID VALVE  MR Mean grad: 76.5 mmHg     TR Peak grad:   32.5 mmHg  MR Vmax:      530.50 cm/s   TR Vmax:        285.00 cm/s  MR Vmean:     414.0 cm/s  MV E velocity: 138.75 cm/s  SHUNTS                              Systemic VTI:  0.12 m                              Systemic Diam: 1.80 cm   Recent Labs: 05/26/2024: BUN 24; Creatinine, Ser 1.17; Potassium 4.6; Sodium 133   Lipid Panel    Component Value Date/Time   CHOL 142 07/18/2020 0930   TRIG 66 07/18/2020 0930   HDL 67 07/18/2020 0930   CHOLHDL 2.1 07/18/2020 0930   CHOLHDL 3.0 01/27/2008 0652   VLDL 10 01/27/2008 0652   LDLCALC 62 07/18/2020 0930     Wt Readings from Last 3 Encounters:  08/04/24 143 lb (64.9 kg)  05/11/24 144 lb 3.2 oz (65.4 kg)  08/13/23 143 lb (64.9 kg)     Assessment and Plan:   1. Severe Aortic Valve Stenosis: He has severe paradoxical low flow/low gradient aortic valve stenosis. NYHA class *** symptoms. I have personally reviewed the echo  images. The aortic valve is thickened and calcified with limited leaflet mobility. I think *** would benefit from AVR. Given advanced age, *** is not a good candidate for conventional AVR by surgical approach. I think *** may be a good candidate for TAVR.   I have reviewed the natural history of aortic stenosis with the patient and their family members  who are present today. We have discussed the limitations of medical therapy and the poor prognosis associated with symptomatic aortic stenosis. We have reviewed potential treatment options, including palliative medical therapy, conventional surgical aortic valve replacement, and transcatheter aortic valve replacement. We discussed treatment options in the context of the patient's specific comorbid medical conditions.    *** would like to proceed with planning for TAVR. I will arrange a right and left heart catheterization at [facility] ***. Risks and benefits of  the cath procedure and the valve procedure are reviewed with the patient. After the cath, *** will have a cardiac CT, CTA of the chest/abdomen and pelvis and will then be referred to see one of the CT surgeons on our TAVR team.     Labs/ tests ordered today include:  No orders of the defined types were placed in this encounter.    Disposition:   F/U will be arranged with the structural team  Signed, Lonni Cash, MD, [facility] 08/08/2024 4:33 PM    [facility] [address] [place], [place], [place]  [address] Phone: [phone]; Fax: [phone]

## 2024-08-09 ENCOUNTER — Encounter: Payer: Self-pay | Admitting: Cardiovascular Disease

## 2024-08-09 ENCOUNTER — Ambulatory Visit: Attending: Cardiovascular Disease | Admitting: Cardiovascular Disease

## 2024-08-09 VITALS — BP 165/89 | HR 74 | Ht 64.0 in | Wt 143.0 lb

## 2024-08-09 DIAGNOSIS — I350 Nonrheumatic aortic (valve) stenosis: Secondary | ICD-10-CM

## 2024-08-09 NOTE — Patient Instructions (Addendum)
 Medication Instructions:  Your physician recommends that you continue on your current medications as directed. Please refer to the Current Medication list given to you today.  *If you need a refill on your cardiac medications before your next appointment, please call your pharmacy*  Lab Work: none If you have labs (blood work) drawn today and your tests are completely normal, you will receive your results only by: MyChart Message (if you have MyChart) OR A paper copy in the mail If you have any lab test that is abnormal or we need to change your treatment, we will call you to review the results.  Testing/Procedures: Your physician has requested that you have an echocardiogram in February 2026. Echocardiography is a painless test that uses sound waves to create images of your heart. It provides your doctor with information about the size and shape of your heart and how well your hearts chambers and valves are working. This procedure takes approximately one hour. There are no restrictions for this procedure. Please do NOT wear cologne, perfume, aftershave, or lotions (deodorant is allowed). Please arrive 15 minutes prior to your appointment time.  Please note: We ask at that you not bring children with you during ultrasound (echo/ vascular) testing. Due to room size and safety concerns, children are not allowed in the ultrasound rooms during exams. Our front office staff cannot provide observation of children in our lobby area while testing is being conducted. An adult accompanying a patient to their appointment will only be allowed in the ultrasound room at the discretion of the ultrasound technician under special circumstances. We apologize for any inconvenience.   Follow-Up: At [facility], you and your health needs are our priority.  As part of our continuing mission to provide you with exceptional heart care, our providers are all part of one team.  This team includes your primary  Cardiologist (physician) and Advanced Practice Providers or APPs (Physician Assistants and Nurse Practitioners) who all work together to provide you with the care you need, when you need it.  Your next appointment:   November 01, 2024 at 11:20  Provider:   Lonni Cash, MD    We recommend signing up for the patient portal called MyChart.  Sign up information is provided on this After Visit Summary.  MyChart is used to connect with patients for Virtual Visits (Telemedicine).  Patients are able to view lab/test results, encounter notes, upcoming appointments, etc.  Non-urgent messages can be sent to your provider as well.   To learn more about what you can do with MyChart, go to [email or web address].   Other Instructions

## 2024-08-21 ENCOUNTER — Other Ambulatory Visit (HOSPITAL_COMMUNITY): Payer: Self-pay

## 2024-08-21 ENCOUNTER — Other Ambulatory Visit: Payer: Self-pay

## 2024-08-21 MED ORDER — HYDROCORTISONE 2.5 % EX CREA
TOPICAL_CREAM | CUTANEOUS | 0 refills | Status: AC
  Filled 2024-08-21: qty 60, 30d supply, fill #0

## 2024-08-27 ENCOUNTER — Other Ambulatory Visit (HOSPITAL_COMMUNITY): Payer: Self-pay

## 2024-08-28 ENCOUNTER — Other Ambulatory Visit (HOSPITAL_COMMUNITY): Payer: Self-pay

## 2024-09-05 ENCOUNTER — Other Ambulatory Visit (HOSPITAL_COMMUNITY): Payer: Self-pay

## 2024-09-06 ENCOUNTER — Other Ambulatory Visit (HOSPITAL_COMMUNITY): Payer: Self-pay

## 2024-09-06 ENCOUNTER — Other Ambulatory Visit: Payer: Self-pay

## 2024-09-10 ENCOUNTER — Other Ambulatory Visit: Payer: Self-pay

## 2024-09-11 ENCOUNTER — Other Ambulatory Visit: Payer: Self-pay

## 2024-09-24 ENCOUNTER — Other Ambulatory Visit: Payer: Self-pay

## 2024-09-24 ENCOUNTER — Other Ambulatory Visit (HOSPITAL_COMMUNITY): Payer: Self-pay

## 2024-10-02 ENCOUNTER — Telehealth: Payer: Self-pay | Admitting: Cardiovascular Disease

## 2024-10-02 NOTE — Telephone Encounter (Signed)
 Pt cancelled 2/4 Echo and is concerned there was not one available before 3/4 appt with Dr. Verlin. Please advise.

## 2024-10-04 ENCOUNTER — Ambulatory Visit (HOSPITAL_COMMUNITY): Admission: RE | Admit: 2024-10-04 | Source: Ambulatory Visit

## 2024-10-30 ENCOUNTER — Ambulatory Visit (HOSPITAL_COMMUNITY)

## 2024-11-01 ENCOUNTER — Ambulatory Visit: Admitting: Cardiovascular Disease
